# Patient Record
Sex: Female | Born: 1985
Health system: Southern US, Community
[De-identification: ages and names within clinical notes are randomized; demographics above are authoritative.]

## PROBLEM LIST (undated history)

## (undated) DIAGNOSIS — R7303 Prediabetes: Secondary | ICD-10-CM

## (undated) DIAGNOSIS — E119 Type 2 diabetes mellitus without complications: Secondary | ICD-10-CM

## (undated) DIAGNOSIS — I129 Hypertensive chronic kidney disease with stage 1 through stage 4 chronic kidney disease, or unspecified chronic kidney disease: Secondary | ICD-10-CM

## (undated) DIAGNOSIS — I10 Essential (primary) hypertension: Secondary | ICD-10-CM

## (undated) HISTORY — PX: FOOT SURGERY: SHX648

## (undated) HISTORY — DX: Prediabetes: R73.03

## (undated) HISTORY — DX: Hypertensive chronic kidney disease with stage 1 through stage 4 chronic kidney disease, or unspecified chronic kidney disease: I12.9

## (undated) HISTORY — DX: Type 2 diabetes mellitus without complications: E11.9

---

## 2009-11-16 ENCOUNTER — Inpatient Hospital Stay (HOSPITAL_COMMUNITY): Admission: AD | Admit: 2009-11-16 | Discharge: 2009-11-16 | Payer: Self-pay | Admitting: Obstetrics & Gynecology

## 2009-12-06 ENCOUNTER — Other Ambulatory Visit: Payer: Self-pay | Admitting: Obstetrics & Gynecology

## 2009-12-06 ENCOUNTER — Ambulatory Visit: Payer: Self-pay | Admitting: Obstetrics and Gynecology

## 2009-12-06 ENCOUNTER — Encounter: Payer: Self-pay | Admitting: Obstetrics and Gynecology

## 2009-12-07 ENCOUNTER — Encounter: Payer: Self-pay | Admitting: Obstetrics and Gynecology

## 2009-12-07 LAB — CONVERTED CEMR LAB: Trich, Wet Prep: NONE SEEN

## 2009-12-13 ENCOUNTER — Ambulatory Visit: Payer: Self-pay | Admitting: Obstetrics & Gynecology

## 2009-12-13 ENCOUNTER — Encounter: Payer: Self-pay | Admitting: Advanced Practice Midwife

## 2009-12-13 LAB — CONVERTED CEMR LAB: Hgb A2 Quant: 2.8 % (ref 2.2–3.2)

## 2009-12-20 ENCOUNTER — Ambulatory Visit: Payer: Self-pay | Admitting: Obstetrics and Gynecology

## 2009-12-23 ENCOUNTER — Ambulatory Visit: Payer: Self-pay | Admitting: Physician Assistant

## 2009-12-23 ENCOUNTER — Inpatient Hospital Stay (HOSPITAL_COMMUNITY): Admission: AD | Admit: 2009-12-23 | Discharge: 2009-12-24 | Payer: Self-pay | Admitting: Obstetrics & Gynecology

## 2009-12-27 ENCOUNTER — Ambulatory Visit: Payer: Self-pay | Admitting: Obstetrics and Gynecology

## 2009-12-27 ENCOUNTER — Encounter: Payer: Self-pay | Admitting: Family

## 2009-12-28 ENCOUNTER — Ambulatory Visit: Payer: Self-pay | Admitting: Obstetrics and Gynecology

## 2009-12-28 ENCOUNTER — Inpatient Hospital Stay (HOSPITAL_COMMUNITY): Admission: AD | Admit: 2009-12-28 | Discharge: 2009-12-31 | Payer: Self-pay | Admitting: Obstetrics and Gynecology

## 2010-12-27 LAB — URINE CULTURE: Culture: NO GROWTH

## 2010-12-27 LAB — GC/CHLAMYDIA PROBE AMP, GENITAL
Chlamydia, DNA Probe: NEGATIVE
GC Probe Amp, Genital: NEGATIVE

## 2010-12-27 LAB — CBC
Hemoglobin: 10.1 g/dL — ABNORMAL LOW (ref 12.0–15.0)
MCV: 83.1 fL (ref 78.0–100.0)
Platelets: 242 10*3/uL (ref 150–400)

## 2010-12-27 LAB — DIFFERENTIAL
Basophils Absolute: 0 10*3/uL (ref 0.0–0.1)
Basophils Relative: 0 % (ref 0–1)
Blasts: 0 %
Eosinophils Absolute: 0 10*3/uL (ref 0.0–0.7)
Lymphocytes Relative: 12 % (ref 12–46)
Lymphs Abs: 0.7 10*3/uL (ref 0.7–4.0)
Neutro Abs: 4.5 10*3/uL (ref 1.7–7.7)
Neutrophils Relative %: 78 % — ABNORMAL HIGH (ref 43–77)

## 2010-12-27 LAB — HEPATITIS B SURFACE ANTIGEN: Hepatitis B Surface Ag: NEGATIVE

## 2010-12-27 LAB — RPR: RPR Ser Ql: NONREACTIVE

## 2010-12-27 LAB — HIV ANTIBODY (ROUTINE TESTING W REFLEX): HIV: NONREACTIVE

## 2010-12-31 LAB — POCT URINALYSIS DIP (DEVICE)
Bilirubin Urine: NEGATIVE
Bilirubin Urine: NEGATIVE
Glucose, UA: NEGATIVE mg/dL
Glucose, UA: NEGATIVE mg/dL
Ketones, ur: NEGATIVE mg/dL
Ketones, ur: NEGATIVE mg/dL
Nitrite: NEGATIVE
Nitrite: NEGATIVE
Nitrite: NEGATIVE
Protein, ur: 100 mg/dL — AB
Protein, ur: >=300 mg/dL — AB
Specific Gravity, Urine: 1.015 (ref 1.005–1.030)
Urobilinogen, UA: 0.2 mg/dL (ref 0.0–1.0)
Urobilinogen, UA: 0.2 mg/dL (ref 0.0–1.0)
pH: 6.5 (ref 5.0–8.0)
pH: 6.5 (ref 5.0–8.0)

## 2010-12-31 LAB — CBC
HCT: 36.3 % (ref 36.0–46.0)
Hemoglobin: 12 g/dL (ref 12.0–15.0)
MCHC: 33 g/dL (ref 30.0–36.0)
MCV: 81.7 fL (ref 78.0–100.0)

## 2010-12-31 LAB — RPR: RPR Ser Ql: NONREACTIVE

## 2011-10-14 IMAGING — US US OB COMP +14 WK
1 series · 14 of 28 positions shown · non-contrast
Comparison: none

OBSTETRICAL ULTRASOUND:
 This ultrasound exam was performed in the [HOSPITAL] Ultrasound Department.  The OB US report was generated in the AS system, and faxed to the ordering physician.  This report is also available in [HOSPITAL]?s AccessANYware and in [REDACTED] PACS.

[Series 1: us ob comp +14 wk · 0.27mm/px · 59 acquisitions, 14 frames shown]
[im 3/59]
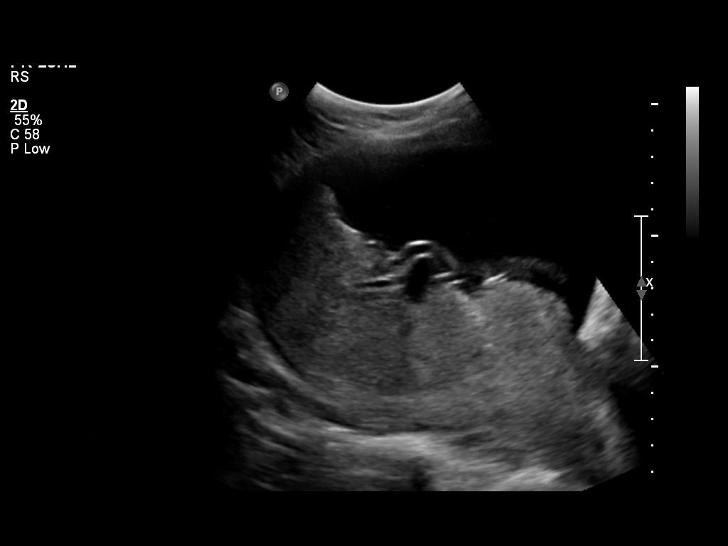
[im 7/59]
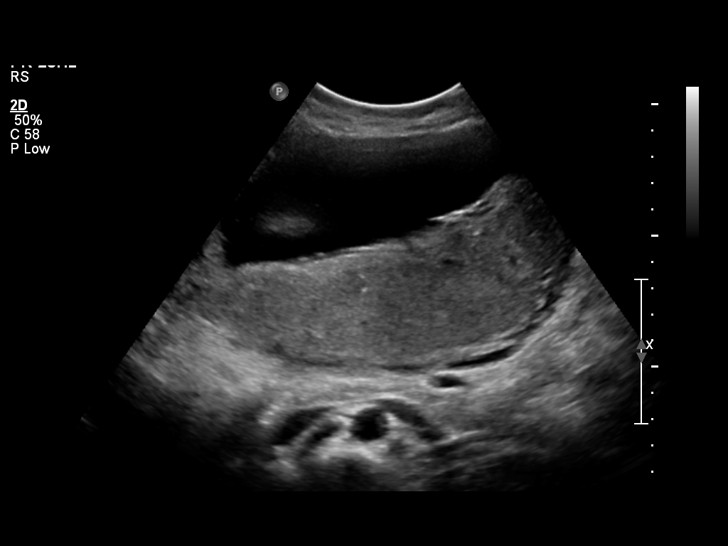
[im 11/59]
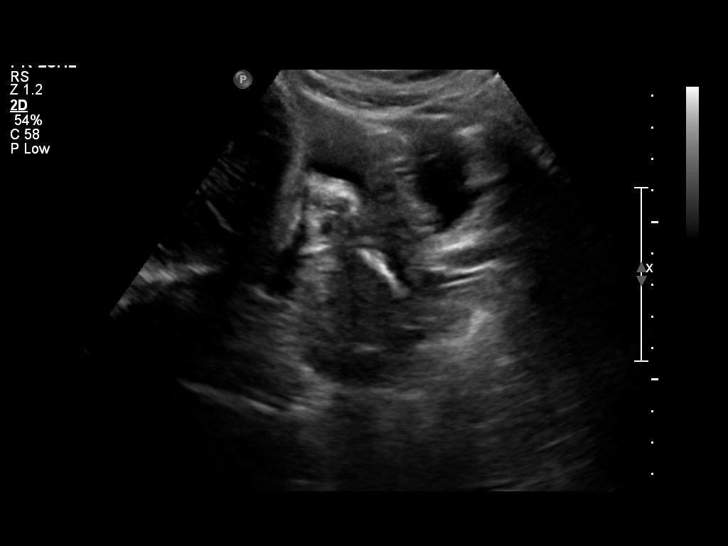
[im 16/59]
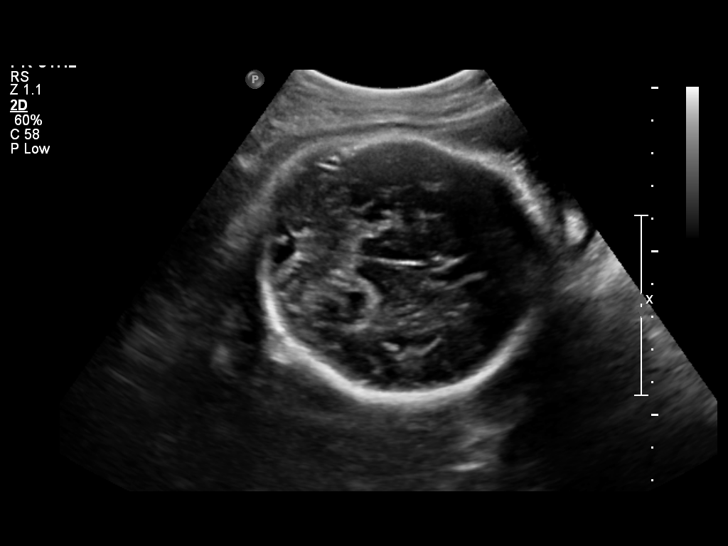
[im 20/59]
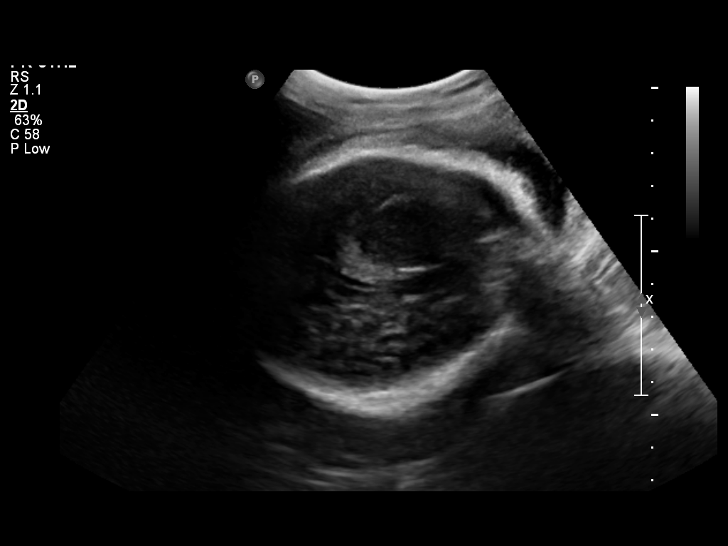
[im 24/59]
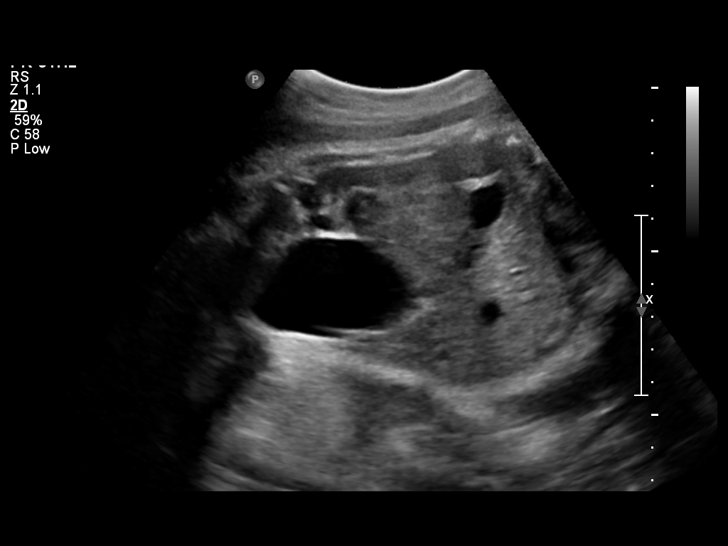
[im 28/59]
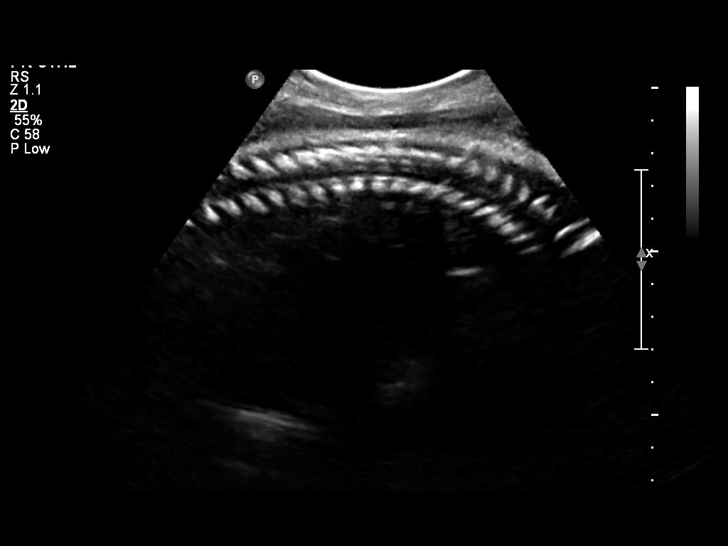
[im 33/59]
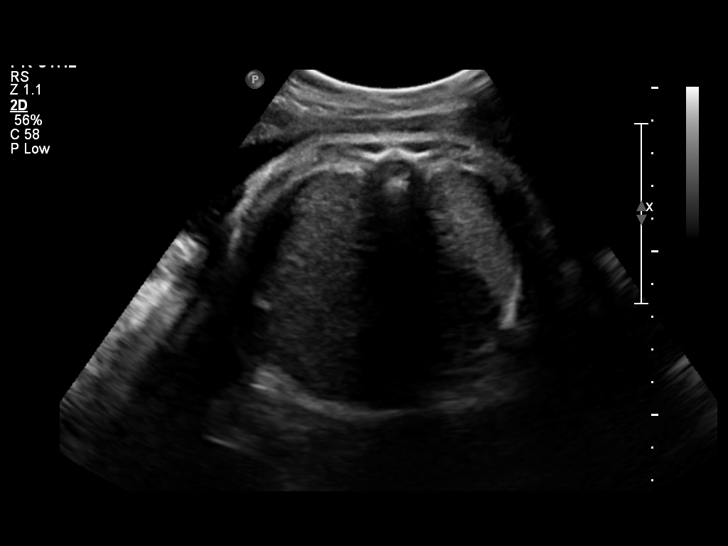
[im 37/59]
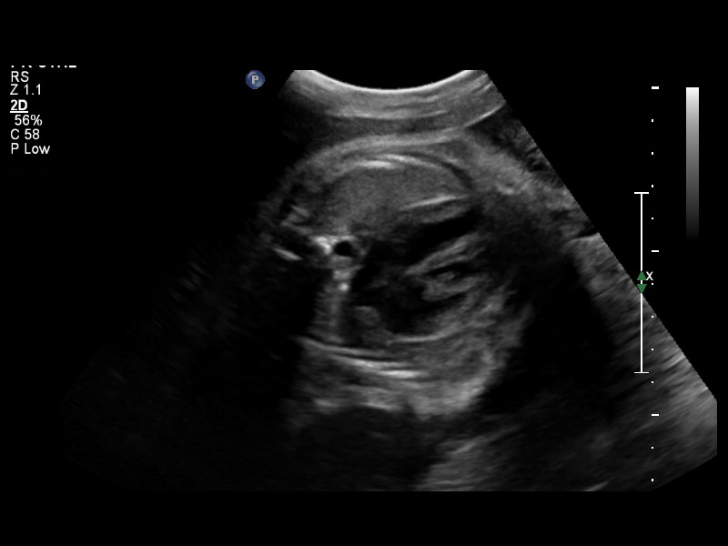
[im 41/59]
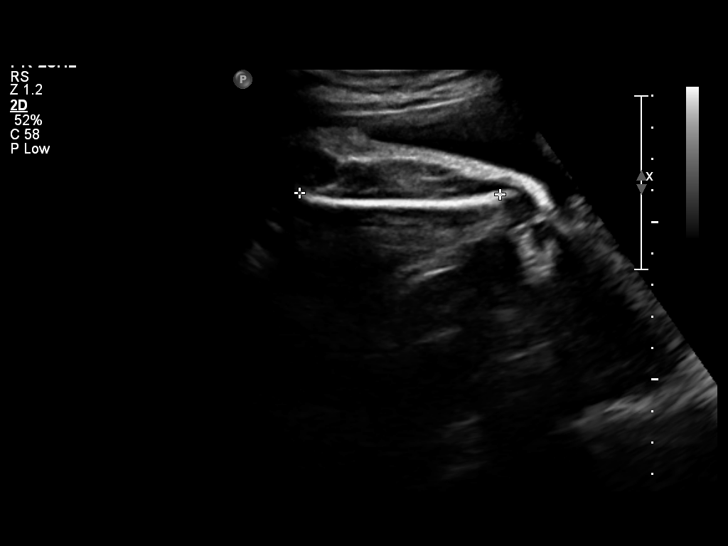
[im 46/59]
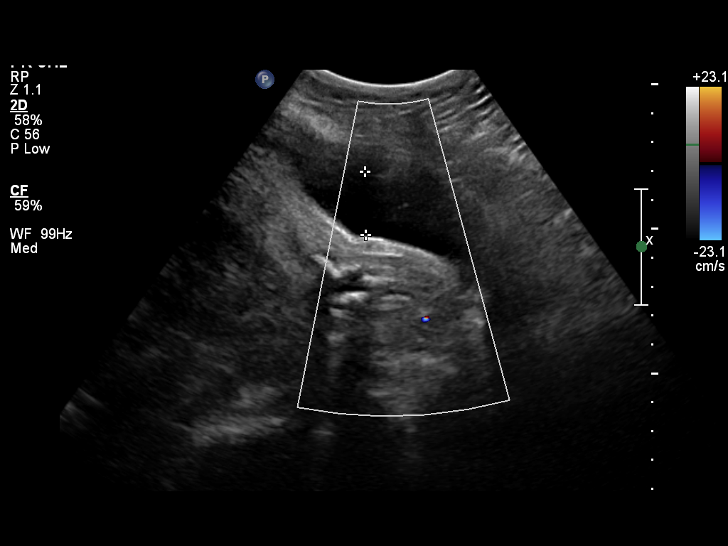
[im 50/59]
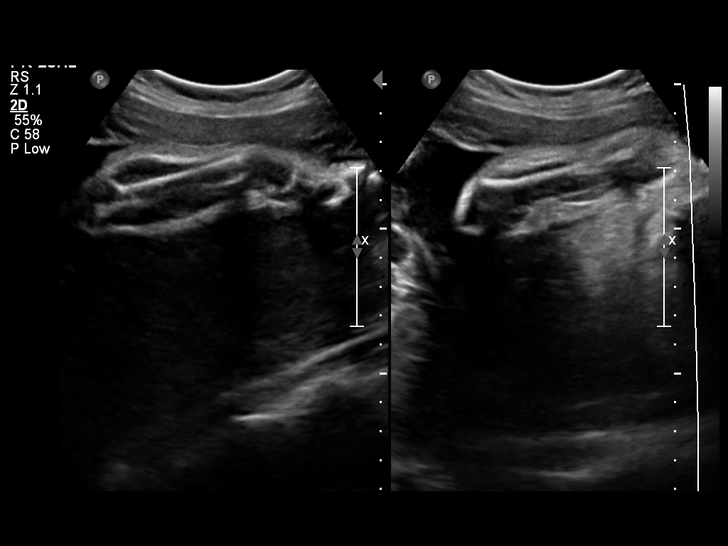
[im 54/59]
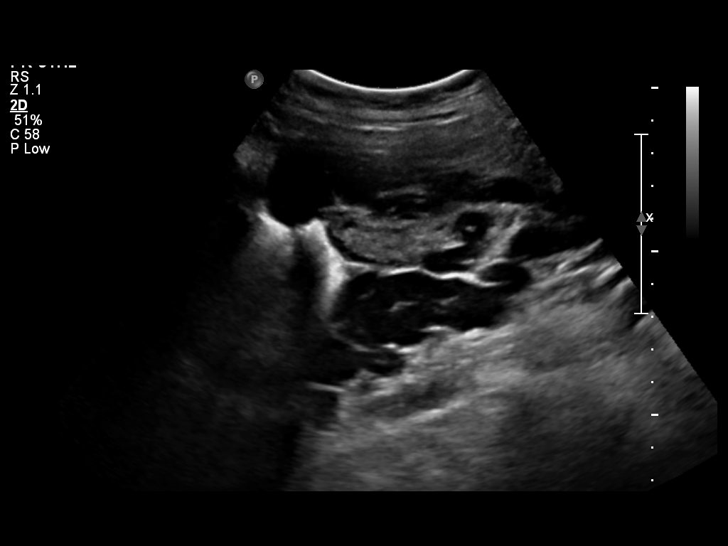
[im 59/59]
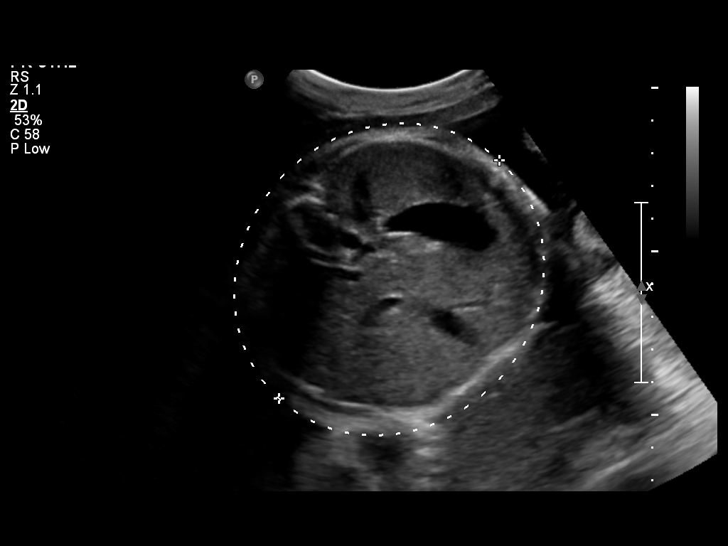

[14 of 28 positions shown; findings below may reference images not displayed]

IMPRESSION: See AS Obstetric US report.

## 2018-07-18 ENCOUNTER — Inpatient Hospital Stay (HOSPITAL_COMMUNITY)
Admission: EM | Admit: 2018-07-18 | Discharge: 2018-07-18 | Disposition: A | Discharge status: Other Acute Inpatient Hospital | Payer: Self-pay | Attending: Family Medicine | Admitting: Family Medicine

## 2018-07-18 ENCOUNTER — Emergency Department (HOSPITAL_COMMUNITY): Payer: Self-pay

## 2018-07-18 ENCOUNTER — Encounter (HOSPITAL_COMMUNITY): Payer: Self-pay | Admitting: Emergency Medicine

## 2018-07-18 DIAGNOSIS — O281 Abnormal biochemical finding on antenatal screening of mother: Secondary | ICD-10-CM | POA: Insufficient documentation

## 2018-07-18 DIAGNOSIS — O10911 Unspecified pre-existing hypertension complicating pregnancy, first trimester: Secondary | ICD-10-CM

## 2018-07-18 DIAGNOSIS — O99810 Abnormal glucose complicating pregnancy: Secondary | ICD-10-CM | POA: Insufficient documentation

## 2018-07-18 DIAGNOSIS — R739 Hyperglycemia, unspecified: Secondary | ICD-10-CM | POA: Insufficient documentation

## 2018-07-18 DIAGNOSIS — O10011 Pre-existing essential hypertension complicating pregnancy, first trimester: Secondary | ICD-10-CM | POA: Insufficient documentation

## 2018-07-18 DIAGNOSIS — R7989 Other specified abnormal findings of blood chemistry: Secondary | ICD-10-CM | POA: Insufficient documentation

## 2018-07-18 DIAGNOSIS — O200 Threatened abortion: Secondary | ICD-10-CM | POA: Insufficient documentation

## 2018-07-18 DIAGNOSIS — Z3A01 Less than 8 weeks gestation of pregnancy: Secondary | ICD-10-CM | POA: Insufficient documentation

## 2018-07-18 HISTORY — DX: Essential (primary) hypertension: I10

## 2018-07-18 LAB — CBC
HCT: 34.2 % — ABNORMAL LOW (ref 36.0–46.0)
HEMATOCRIT: 29.9 % — AB (ref 36.0–46.0)
HEMOGLOBIN: 10.4 g/dL — AB (ref 12.0–15.0)
HEMOGLOBIN: 10 g/dL — AB (ref 12.0–15.0)
MCH: 26.1 pg (ref 26.0–34.0)
MCH: 27.2 pg (ref 26.0–34.0)
MCHC: 30.4 g/dL (ref 30.0–36.0)
MCHC: 33.4 g/dL (ref 30.0–36.0)
MCV: 85.9 fL (ref 80.0–100.0)
MCV: 81.3 fL (ref 80.0–100.0)
PLATELETS: 311 10*3/uL (ref 150–400)
Platelets: 301 10*3/uL (ref 150–400)
RBC: 3.98 MIL/uL (ref 3.87–5.11)
RBC: 3.68 MIL/uL — ABNORMAL LOW (ref 3.87–5.11)
RDW: 13.4 % (ref 11.5–15.5)
RDW: 13.9 % (ref 11.5–15.5)
WBC: 13.5 10*3/uL — AB (ref 4.0–10.5)
WBC: 15.7 10*3/uL — AB (ref 4.0–10.5)
nRBC: 0 % (ref 0.0–0.2)
nRBC: 0 % (ref 0.0–0.2)

## 2018-07-18 LAB — COMPREHENSIVE METABOLIC PANEL
ALK PHOS: 106 U/L (ref 38–126)
ALT: 32 U/L (ref 0–44)
AST: 32 U/L (ref 15–41)
Albumin: 3 g/dL — ABNORMAL LOW (ref 3.5–5.0)
Anion gap: 8 (ref 5–15)
BILIRUBIN TOTAL: 0.5 mg/dL (ref 0.3–1.2)
BUN: 27 mg/dL — AB (ref 6–20)
CALCIUM: 8.9 mg/dL (ref 8.9–10.3)
CO2: 21 mmol/L — ABNORMAL LOW (ref 22–32)
CREATININE: 1.93 mg/dL — AB (ref 0.44–1.00)
Chloride: 105 mmol/L (ref 98–111)
GFR calc Af Amer: 39 mL/min — ABNORMAL LOW (ref >60)
GFR, EST NON AFRICAN AMERICAN: 34 mL/min — AB (ref >60)
GLUCOSE: 173 mg/dL — AB (ref 70–99)
POTASSIUM: 3.7 mmol/L (ref 3.5–5.1)
Sodium: 134 mmol/L — ABNORMAL LOW (ref 135–145)
TOTAL PROTEIN: 6.1 g/dL — AB (ref 6.5–8.1)

## 2018-07-18 LAB — ABO/RH: ABO/RH(D): O POS

## 2018-07-18 LAB — TYPE AND SCREEN
ABO/RH(D): O POS
ABO/RH(D): O POS
ANTIBODY SCREEN: NEGATIVE
ANTIBODY SCREEN: NEGATIVE

## 2018-07-18 LAB — I-STAT BETA HCG BLOOD, ED (MC, WL, AP ONLY): I-stat hCG, quantitative: 559 m[IU]/mL — ABNORMAL HIGH (ref <5)

## 2018-07-18 LAB — HCG, QUANTITATIVE, PREGNANCY: hCG, Beta Chain, Quant, S: 468 m[IU]/mL — ABNORMAL HIGH (ref <5)

## 2018-07-18 MED ORDER — HYDROCODONE-ACETAMINOPHEN 5-325 MG PO TABS
1.0000 | ORAL_TABLET | Freq: Once | ORAL | Status: AC
  Administered 2018-07-18: 1 via ORAL
  Filled 2018-07-18: qty 1

## 2018-07-18 MED ORDER — LABETALOL HCL 200 MG PO TABS: 200.0000 mg | ORAL_TABLET | Freq: Three times a day (TID) | ORAL | 1 refills | Status: DC

## 2018-07-18 MED ORDER — TRAMADOL HCL 50 MG PO TABS: 50.0000 mg | ORAL_TABLET | Freq: Four times a day (QID) | ORAL | 0 refills | Status: DC | PRN

## 2018-07-18 MED ORDER — TRAMADOL HCL 50 MG PO TABS
100.0000 mg | ORAL_TABLET | Freq: Once | ORAL | Status: AC
  Administered 2018-07-18: 100 mg via ORAL
  Filled 2018-07-18: qty 2

## 2018-07-18 MED ORDER — LABETALOL HCL 100 MG PO TABS
200.0000 mg | ORAL_TABLET | Freq: Two times a day (BID) | ORAL | Status: DC
  Administered 2018-07-18: 200 mg via ORAL
  Filled 2018-07-18: qty 2

## 2018-07-18 MED ORDER — SODIUM CHLORIDE 0.9 % IV BOLUS
1000.0000 mL | Freq: Once | INTRAVENOUS | Status: AC
  Administered 2018-07-18: 1000 mL via INTRAVENOUS

## 2018-07-18 NOTE — MAU Provider Note (Signed)
Chief Complaint: Vaginal Bleeding   First Provider Initiated Contact with Patient 07/18/18 1737     SUBJECTIVE HPI: Renee Porter is a 32 y.o. G3P2000 at 107w2d who transferred to Maternity Admissions from [facility] ED for further evaluation for heavy bleeding and pain in early pregnancy. Showed nothing in the uterus or adnexa. iStat quant was 559. Hgb 10.4. Pt had heavy bleeding in the ED.   Vaginal Bleeding: Heavy Passage of tissue or clots: Passing clots. No reported passage of tissue by pt or ED provider Dizziness: Denies  O POS  Pain Location: Suprapubic Quality: cramping Severity: moderate Duration: few days Course: Improved w/ 1 Vicodin in ED, worsening again.  Context: Early pregnancy Timing: intermittent Modifying factors: See above Associated signs and symptoms: Negative for fever, chills, dizziness, urinary complaints, GI complaints.  Positive for heavy vaginal bleeding and passing clots.  Lives in video interpreter is used.  Past Medical History:  Diagnosis Date  . Hypertension    OB History  Gravida Para Term Preterm AB Living  3 2 2         SAB TAB Ectopic Multiple Live Births               # Outcome Date GA Lbr Len/2nd Weight Sex Delivery Anes PTL Lv  3 Current           2 Term      Vag-Spont     1 Term      CS-LTranv      History reviewed. No pertinent surgical history. Social History   Socioeconomic History  . Marital status: Single    Spouse name: Not on file  . Number of children: Not on file  . Years of education: Not on file  . Highest education level: Not on file  Occupational History  . Not on file  Social Needs  . Financial resource strain: Not on file  . Food insecurity:    Worry: Not on file    Inability: Not on file  . Transportation needs:    Medical: Not on file    Non-medical: Not on file  Tobacco Use  . Smoking status: Not on file  Substance and Sexual Activity  . Alcohol use: Not on file  . Drug use: Not on file  .  Sexual activity: Not on file  Lifestyle  . Physical activity:    Days per week: Not on file    Minutes per session: Not on file  . Stress: Not on file  Relationships  . Social connections:    Talks on phone: Not on file    Gets together: Not on file    Attends religious service: Not on file    Active member of club or organization: Not on file    Attends meetings of clubs or organizations: Not on file    Relationship status: Not on file  . Intimate partner violence:    Fear of current or ex partner: Not on file    Emotionally abused: Not on file    Physically abused: Not on file    Forced sexual activity: Not on file  Other Topics Concern  . Not on file  Social History Narrative  . Not on file   No current facility-administered medications on file prior to encounter.    Current Outpatient Medications on File Prior to Encounter  Medication Sig Dispense Refill  . acetaminophen (TYLENOL) 500 MG tablet Take 500 mg by mouth every 6 (six) hours as needed for mild  pain.    . ibuprofen (ADVIL,MOTRIN) 200 MG tablet Take 200 mg by mouth every 6 (six) hours as needed for headache.     No Known Allergies  I have reviewed the past Medical Hx, Surgical Hx, Social Hx, Allergies and Medications.   Review of Systems  Constitutional: Negative for chills and fever.  Respiratory: Negative for shortness of breath.   Cardiovascular: Negative for chest pain.  Gastrointestinal: Positive for abdominal pain. Negative for nausea and vomiting.  Genitourinary: Positive for vaginal bleeding. Negative for dysuria.  Neurological: Negative for dizziness.    OBJECTIVE Patient Vitals for the past 24 hrs:  BP Temp Temp src Pulse Resp SpO2  07/18/18 2100 (!) 185/98 - - 81 - -  07/18/18 2045 (!) 182/96 - - 79 - -  07/18/18 2033 (!) 182/106 - - 81 - -  07/18/18 2024 (!) 191/101 - - 78 - -  07/18/18 1734 (!) 175/105 98 F (36.7 C) Oral 88 16 99 %  07/18/18 1700 (!) 171/93 - - 86 11 100 %  07/18/18 1600  (!) 147/95 - - 89 11 99 %  07/18/18 1530 (!) 142/89 - - 84 18 100 %  07/18/18 1430 (!) 183/108 - - (!) 106 (!) 9 100 %  07/18/18 1315 (!) 168/98 - - (!) 107 10 99 %  07/18/18 1300 (!) 181/104 - - (!) 112 11 100 %  07/18/18 1230 (!) 132/119 - - - - -  07/18/18 1218 (!) 179/120 98.1 F (36.7 C) - (!) 112 19 100 %   Constitutional: Well-developed, well-nourished female in no acute distress.  Cardiovascular: normal rate Respiratory: normal rate and effort.  GI: Abd soft, non-tender.  Neurologic: Alert and oriented x 4.  GU:   PELVIC EXAM: NEFG, small amount of bright red blood on pad  BIMANUAL: Deferred due to recent exam  LAB RESULTS Results for orders placed or performed during the hospital encounter of 07/18/18 (from the past 24 hour(s))  Type and screen [place]     Status: None   Collection Time: 07/18/18 12:20 PM  Result Value Ref Range   ABO/RH(D) O POS    Antibody Screen NEG    Sample Expiration      07/21/2018 Performed at [facility], [place] [address] [place], [place] [address]   ABO/Rh     Status: None   Collection Time: 07/18/18 12:20 PM  Result Value Ref Range   ABO/RH(D) O POS    No rh immune globuloin      NOT A RH IMMUNE GLOBULIN CANDIDATE, PT RH POSITIVE Performed at [facility] [address] [place], [place] [address]   Comprehensive metabolic panel     Status: Abnormal   Collection Time: 07/18/18 12:25 PM  Result Value Ref Range   Sodium 134 (L) 135 - 145 mmol/L   Potassium 3.7 3.5 - 5.1 mmol/L   Chloride 105 98 - 111 mmol/L   CO2 21 (L) 22 - 32 mmol/L   Glucose, Bld 173 (H) 70 - 99 mg/dL   BUN 27 (H) 6 - 20 mg/dL   Creatinine, Ser 1.93 (H) 0.44 - 1.00 mg/dL   Calcium 8.9 8.9 - 10.3 mg/dL   Total Protein 6.1 (L) 6.5 - 8.1 g/dL   Albumin 3.0 (L) 3.5 - 5.0 g/dL   AST 32 15 - 41 U/L   ALT 32 0 - 44 U/L   Alkaline Phosphatase 106 38 - 126 U/L   Total  Bilirubin 0.5 0.3 - 1.2 mg/dL   GFR calc non Af Amer 34 (L) >60  mL/min   GFR calc Af Amer 39 (L) >60 mL/min   Anion gap 8 5 - 15  CBC     Status: Abnormal   Collection Time: 07/18/18 12:25 PM  Result Value Ref Range   WBC 13.5 (H) 4.0 - 10.5 K/uL   RBC 3.98 3.87 - 5.11 MIL/uL   Hemoglobin 10.4 (L) 12.0 - 15.0 g/dL   HCT 34.2 (L) 36.0 - 46.0 %   MCV 85.9 80.0 - 100.0 fL   MCH 26.1 26.0 - 34.0 pg   MCHC 30.4 30.0 - 36.0 g/dL   RDW 13.4 11.5 - 15.5 %   Platelets 311 150 - 400 K/uL   nRBC 0.0 0.0 - 0.2 %  I-Stat beta hCG blood, ED     Status: Abnormal   Collection Time: 07/18/18 12:30 PM  Result Value Ref Range   I-stat hCG, quantitative 559.0 (H) <5 mIU/mL   Comment 3          CBC     Status: Abnormal   Collection Time: 07/18/18  6:32 PM  Result Value Ref Range   WBC 15.7 (H) 4.0 - 10.5 K/uL   RBC 3.68 (L) 3.87 - 5.11 MIL/uL   Hemoglobin 10.0 (L) 12.0 - 15.0 g/dL   HCT 29.9 (L) 36.0 - 46.0 %   MCV 81.3 80.0 - 100.0 fL   MCH 27.2 26.0 - 34.0 pg   MCHC 33.4 30.0 - 36.0 g/dL   RDW 13.9 11.5 - 15.5 %   Platelets 301 150 - 400 K/uL   nRBC 0.0 0.0 - 0.2 %  Type and screen     Status: None   Collection Time: 07/18/18  6:32 PM  Result Value Ref Range   ABO/RH(D) O POS    Antibody Screen NEG    Sample Expiration      07/21/2018 Performed at [facility], [address], [place], [place] [address]   hCG, quantitative, pregnancy     Status: Abnormal   Collection Time: 07/18/18  6:32 PM  Result Value Ref Range   hCG, Beta Chain, Quant, S 468 (H) <5 mIU/mL    IMAGING US Ob Comp < 14 Wks  Result Date: 07/18/2018 CLINICAL DATA:  Pregnant patient with vaginal bleeding. EXAM: OBSTETRIC <14 WK [place] AND TRANSVAGINAL OB US TECHNIQUE: Both transabdominal and transvaginal ultrasound examinations were performed for complete evaluation of the gestation as well as the maternal uterus, adnexal regions, and pelvic cul-de-sac. Transvaginal technique was performed to assess early pregnancy. COMPARISON:  None. FINDINGS: Intrauterine gestational sac: None  Yolk sac:  Not Visualized. Embryo:  Not Visualized. Cardiac Activity: Not Visualized. Maternal uterus/adnexae: Uterus is heterogeneous in echogenicity. Ovaries are not visualized due to overlying bowel gas. Adnexal structures are not well assessed. No free fluid in the pelvis. IMPRESSION: No intrauterine gestation identified. In the setting of positive pregnancy test and no definite intrauterine pregnancy, this reflects a pregnancy of unknown location. Differential considerations include early normal IUP, abnormal IUP, or nonvisualized ectopic pregnancy. Differentiation is achieved with serial beta HCG supplemented by repeat sonography as clinically warranted. Electronically Signed   By: Lovey Newcomer M.D.   On: 07/18/2018 15:59   US Ob Transvaginal  Result Date: 07/18/2018 CLINICAL DATA:  Pregnant patient with vaginal bleeding. EXAM: OBSTETRIC <14 WK [place] AND TRANSVAGINAL OB US TECHNIQUE: Both transabdominal and transvaginal ultrasound examinations were performed for complete evaluation of the gestation  as well as the maternal uterus, adnexal regions, and pelvic cul-de-sac. Transvaginal technique was performed to assess early pregnancy. COMPARISON:  None. FINDINGS: Intrauterine gestational sac: None Yolk sac:  Not Visualized. Embryo:  Not Visualized. Cardiac Activity: Not Visualized. Maternal uterus/adnexae: Uterus is heterogeneous in echogenicity. Ovaries are not visualized due to overlying bowel gas. Adnexal structures are not well assessed. No free fluid in the pelvis. IMPRESSION: No intrauterine gestation identified. In the setting of positive pregnancy test and no definite intrauterine pregnancy, this reflects a pregnancy of unknown location. Differential considerations include early normal IUP, abnormal IUP, or nonvisualized ectopic pregnancy. Differentiation is achieved with serial beta HCG supplemented by repeat sonography as clinically warranted. Electronically Signed   By: Lovey Newcomer M.D.   On:  07/18/2018 15:59    MAU COURSE CBC, Quant  MDM - Plain and bleeding in early pregnancy with pregnancy of unknown anatomic location and hemodynamically unstable.  -Chronic hypertension, not on any meds.  Has previously been on lisinopril.  No chest pain or shortness of breath.  Had been addressed by ED.  Start labetalol 200 mg p.o. 3 times daily.  First dose given in MAU.  Blood pressure check on 07/20/2018.  -Elevated serum creatinine.  No baseline.  Recheck at follow-up appointment.  ASSESSMENT 1. Threatened abortion in early pregnancy   2. Less than [redacted] weeks gestation of pregnancy   3. Maternal chronic hypertension in first trimester   4. Elevated serum creatinine   5. Hyperglycemia during pregnancy     PLAN Discharge home in stable condition. SAB/ectopic precautions Pretension precautions Follow-up [place] for [facility] Follow up on 07/20/2018.   Specialty:  Obstetrics and Gynecology Why:  at 2:00 pm for repeat bloodwork and blood pressure check  Contact information: [place] Follow up.   Why:  as needed in pregnancy emergencies Contact information: [address] [id number] [place] [phone]         Allergies as of 07/18/2018   No Known Allergies     Medication List    STOP taking these medications   ibuprofen 200 MG tablet Commonly known as:  ADVIL,MOTRIN     TAKE these medications   acetaminophen 500 MG tablet Commonly known as:  TYLENOL Take 500 mg by mouth every 6 (six) hours as needed for mild pain.   labetalol 200 MG tablet Commonly known as:  NORMODYNE Take 1 tablet (200 mg total) by mouth 3 (three) times daily.   traMADol 50 MG tablet Commonly known as:  ULTRAM Take 1-2 tablets (50-100 mg total) by mouth every 6 (six) hours as needed for severe pain.        Tamala Julian,  [place], [place] 07/18/2018  9:10 PM  4

## 2018-07-18 NOTE — ED Provider Notes (Addendum)
[facility] EMERGENCY DEPARTMENT Provider Note   CSN: [id number] Arrival date & time: 07/18/18  1209     History   Chief Complaint Chief Complaint  Patient presents with  . Vaginal Bleeding    HPI Renee Porter is a 32 y.o. female.  HPI   She presents for evaluation of intermittent vaginal bleeding for 2 weeks.  The bleeding recurred today, and is associated with low abdominal and pelvic discomfort.  Pain is worse when she ambulates.  She passed multiple blood clots today.  Last menstrual period was about 2 months ago.  She has been trying to get pregnant.  She has had 2 prior pregnancies, with one vaginal delivery, and one cesarean delivery.  She denies headache, nausea, vomiting, fever, chills, upper abdominal pain or back pain.  Try to work today but did leave when she developed pain and increased bleeding.  There are no other known modifying factors.   Past Medical History:  Diagnosis Date  . Hypertension     There are no active problems to display for this patient.   History reviewed. No pertinent surgical history.   OB History    Gravida  3   Para  2   Term  2   Preterm      AB      Living        SAB      TAB      Ectopic      Multiple      Live Births               Home Medications    Prior to Admission medications   Medication Sig Start Date End Date Taking? Authorizing Provider  acetaminophen (TYLENOL) 500 MG tablet Take 500 mg by mouth every 6 (six) hours as needed for mild pain.   Yes [provider]  labetalol (NORMODYNE) 200 MG tablet Take 1 tablet (200 mg total) by mouth 3 (three) times daily. 07/18/18   Tamala Julian, [place], CNM  traMADol (ULTRAM) 50 MG tablet Take 1-2 tablets (50-100 mg total) by mouth every 6 (six) hours as needed for severe pain. 07/18/18   Manya Silvas, CNM    Family History No family history on file.  Social History Social History   Tobacco Use  . Smoking status: Not on  file  Substance Use Topics  . Alcohol use: Not on file  . Drug use: Not on file     Allergies   Patient has no known allergies.   Review of Systems Review of Systems  All other systems reviewed and are negative.    Physical Exam Updated Vital Signs BP (!) 185/98   Pulse 81   Temp 98 F (36.7 C) (Oral)   Resp 16   LMP 05/21/2018 (Within Weeks)   SpO2 99% Comment: ra  Physical Exam  Constitutional: She is oriented to person, place, and time. She appears well-developed and well-nourished.  HENT:  Head: Normocephalic and atraumatic.  Eyes: Pupils are equal, round, and reactive to light. Conjunctivae and EOM are normal.  Neck: Normal range of motion and phonation normal. Neck supple.  Cardiovascular: Normal rate and regular rhythm.  Pulmonary/Chest: Effort normal and breath sounds normal. She exhibits no tenderness.  Abdominal: Soft. She exhibits no distension. There is no tenderness. There is no guarding.  Genitourinary:  Genitourinary Comments: Normal external female genitalia.  Vagina with large amount of blood and multiple clots.  Unable to clear clots from vagina using 8  large cotton swabs.  Cervix could not be visualized but with gentle sounding using a swab, seems to be open.  Musculoskeletal: Normal range of motion.  Neurological: She is alert and oriented to person, place, and time. She exhibits normal muscle tone.  Skin: Skin is warm and dry.  Psychiatric: She has a normal mood and affect. Her behavior is normal. Judgment and thought content normal.  Nursing note and vitals reviewed.    ED Treatments / Results  Labs (all labs ordered are listed, but only abnormal results are displayed) Labs Reviewed  COMPREHENSIVE METABOLIC PANEL - Abnormal; Notable for the following components:      Result Value   Sodium 134 (*)    CO2 21 (*)    Glucose, Bld 173 (*)    BUN 27 (*)    Creatinine, Ser 1.93 (*)    Total Protein 6.1 (*)    Albumin 3.0 (*)    GFR calc non Af  Amer 34 (*)    GFR calc Af Amer 39 (*)    All other components within normal limits  CBC - Abnormal; Notable for the following components:   WBC 13.5 (*)    Hemoglobin 10.4 (*)    HCT 34.2 (*)    All other components within normal limits  CBC - Abnormal; Notable for the following components:   WBC 15.7 (*)    RBC 3.68 (*)    Hemoglobin 10.0 (*)    HCT 29.9 (*)    All other components within normal limits  HCG, QUANTITATIVE, PREGNANCY - Abnormal; Notable for the following components:   hCG, Beta Chain, Quant, S 468 (*)    All other components within normal limits  I-STAT BETA HCG BLOOD, ED ([facility], [facility], [facility] ONLY) - Abnormal; Notable for the following components:   I-stat hCG, quantitative 559.0 (*)    All other components within normal limits  TYPE AND SCREEN  ABO/RH  TYPE AND SCREEN    EKG None  Radiology US Ob Comp < 14 Wks  Result Date: 07/18/2018 CLINICAL DATA:  Pregnant patient with vaginal bleeding. EXAM: OBSTETRIC <14 WK [place] AND TRANSVAGINAL OB US TECHNIQUE: Both transabdominal and transvaginal ultrasound examinations were performed for complete evaluation of the gestation as well as the maternal uterus, adnexal regions, and pelvic cul-de-sac. Transvaginal technique was performed to assess early pregnancy. COMPARISON:  None. FINDINGS: Intrauterine gestational sac: None Yolk sac:  Not Visualized. Embryo:  Not Visualized. Cardiac Activity: Not Visualized. Maternal uterus/adnexae: Uterus is heterogeneous in echogenicity. Ovaries are not visualized due to overlying bowel gas. Adnexal structures are not well assessed. No free fluid in the pelvis. IMPRESSION: No intrauterine gestation identified. In the setting of positive pregnancy test and no definite intrauterine pregnancy, this reflects a pregnancy of unknown location. Differential considerations include early normal IUP, abnormal IUP, or nonvisualized ectopic pregnancy. Differentiation is achieved with serial beta HCG supplemented by  repeat sonography as clinically warranted. Electronically Signed   By: Lovey Newcomer M.D.   On: 07/18/2018 15:59   US Ob Transvaginal  Result Date: 07/18/2018 CLINICAL DATA:  Pregnant patient with vaginal bleeding. EXAM: OBSTETRIC <14 WK [place] AND TRANSVAGINAL OB US TECHNIQUE: Both transabdominal and transvaginal ultrasound examinations were performed for complete evaluation of the gestation as well as the maternal uterus, adnexal regions, and pelvic cul-de-sac. Transvaginal technique was performed to assess early pregnancy. COMPARISON:  None. FINDINGS: Intrauterine gestational sac: None Yolk sac:  Not Visualized. Embryo:  Not Visualized. Cardiac Activity: Not Visualized. Maternal uterus/adnexae:  Uterus is heterogeneous in echogenicity. Ovaries are not visualized due to overlying bowel gas. Adnexal structures are not well assessed. No free fluid in the pelvis. IMPRESSION: No intrauterine gestation identified. In the setting of positive pregnancy test and no definite intrauterine pregnancy, this reflects a pregnancy of unknown location. Differential considerations include early normal IUP, abnormal IUP, or nonvisualized ectopic pregnancy. Differentiation is achieved with serial beta HCG supplemented by repeat sonography as clinically warranted. Electronically Signed   By: Lovey Newcomer M.D.   On: 07/18/2018 15:59    Procedures Procedures (including critical care time)  Medications Ordered in ED Medications  HYDROcodone-acetaminophen (NORCO/VICODIN) 5-325 MG per tablet 1 tablet (1 tablet Oral Given 07/18/18 1414)  sodium chloride 0.9 % bolus 1,000 mL (0 mLs Intravenous Stopped 07/18/18 2111)  traMADol (ULTRAM) tablet 100 mg (100 mg Oral Given 07/18/18 2030)     Initial Impression / Assessment and Plan / ED Course  I have reviewed the triage vital signs and the nursing notes.  Pertinent labs & imaging results that were available during my care of the patient were reviewed by me and considered in my  medical decision making (see chart for details).  Clinical Course as of Jul 19 2148  Sat Jul 18, 2018  1536 High  I-stat hCG, quantitative(!): 559.0 [EW]  1536 Normal except white count high, hemoglobin low  CBC(!) [EW]  1536 Normal except sodium low, CO2 low, glucose high, BUN high, creatinine high, total protein low, albumin low, GFR low  Comprehensive metabolic panel(!) [EW]  0272 Blood type O+, no need for RhoGam.  Type and screen [place] [EW]  1610 I reviewed with the husband and he is translating to his wife the results of the ultrasound and her testing.  I explained that this could be a normal pregnancy and just very early in her dates may be off.  Also she might of just had a miscarriage and will need another blood test to identify this.  They also understand the third possibility that this could be an ectopic pregnancy and she will need to follow-up with a specialist.  They understand to go to [facility] if any increased pain or bleeding and if everything is okay they will need to repeat blood test in 2 to 3 days.   [MB]  1640 Husband asks if I could go reexamine her because she had some more bleeding prior to discharge.  Her vault is full of clot and difficult to remove enough to see where the cervix is.   [MB]  5366 Discussed with Dr[facility] Kennon [facility] who recommends that the patient be transferred over to the MA you for evaluation by them.  I have updated the patient and her husband.   [MB]    Clinical Course User Index [EW] Daleen Bo, MD [MB] Hayden Rasmussen, MD     No data found.    Medical Decision Making: Bleeding in early pregnancy, clinically appears to be miscarriage.  No indication for RhoGam.  Mild blood pressure elevation without significant tachycardia or appearance hemodynamic instability.  Ultrasound ordered to evaluate for ectopic pregnancy/pregnancy complications.  CRITICAL CARE-no Performed by: Daleen Bo   Nursing  Notes Reviewed/ Care Coordinated Applicable Imaging Reviewed Interpretation of Laboratory Data incorporated into ED treatment   Plan-as per oncoming provider team to evaluate after imaging.   Final Clinical Impressions(s) / ED Diagnoses   Final diagnoses:  Less than [redacted] weeks gestation of pregnancy  Threatened abortion in early pregnancy  Maternal chronic hypertension in first trimester  Elevated serum creatinine  Hyperglycemia during pregnancy    ED Discharge Orders         Ordered    labetalol (NORMODYNE) 200 MG tablet  3 times daily     07/18/18 2105    Discharge patient     07/18/18 2105    traMADol (ULTRAM) 50 MG tablet  Every 6 hours PRN     07/18/18 2105           Daleen Bo, MD 07/18/18 1608    Daleen Bo, MD 07/19/18 2150

## 2018-07-18 NOTE — Discharge Instructions (Addendum)
You were evaluated in the emergency department for lower abdominal pain and vaginal bleeding in the setting of an early pregnancy.  You had an ultrasound that did not show a pregnancy in the uterus.  This will need to be followed by [facility] on the ground floor in 2 days.  If you have any increased bleeding or pain please go to Maternity Admissions immediately.    Amenaza de aborto (Threatened Miscarriage) La amenaza de aborto se produce cuando hay hemorragia vaginal durante las primeras 20semanas de [place], pero el embarazo no se interrumpe. Si durante este perodo usted tiene hemorragia vaginal, el mdico le har pruebas para asegurarse de que el embarazo contine. Si las pruebas muestran que usted contina embarazada y que el "beb" en desarrollo (feto) dentro del tero sigue creciendo, se considera que tuvo una [place] de aborto. La amenaza de aborto no implica que el embarazo vaya a Manufacturing engineer, pero s aumenta el riesgo de perder el embarazo (aborto completo). CAUSAS Por lo general, no se conoce la causa de la amenaza de aborto. Si el resultado final es el aborto completo, la causa ms frecuente es la cantidad anormal de cromosomas del feto. Los cromosomas son las estructuras internas de las clulas que contienen todo Agricultural engineer gentico. Clinical research associate de las causas de hemorragia vaginal que no ocasionan un aborto incluyen:  [place].  [place].  Los cambios hormonales normales durante el [place].  La hemorragia que se produce cuando el vulo se implanta en el tero. FACTORES DE RIESGO Los factores de riesgo de hemorragia al principio del embarazo incluyen:  Obesidad.  Fumar.  El consumo de cantidades excesivas de alcohol o cafena.  El consumo de drogas. SIGNOS Y SNTOMAS  Hemorragia vaginal leve.  Dolor o clicos abdominales leves. DIAGNSTICO Si tiene hemorragia con o sin dolor abdominal antes de las 20semanas de  [place], el mdico le har pruebas para determinar si el embarazo contina. Una prueba importante incluye el uso de ondas sonoras y de una computadora (ecografa) para crear imgenes del interior del tero. Otras pruebas incluyen el examen interno de la vagina y el tero (examen plvico), y el control de la frecuencia cardaca del feto. Es posible que le diagnostiquen una amenaza de aborto en los siguientes casos:  La ecografa muestra que el embarazo contina.  La frecuencia cardaca del feto es alta.  El examen plvico muestra que la apertura entre el tero y la vagina (cuello del tero) est cerrada.  Su frecuencia cardaca y su presin arterial estn estables.  Los [employer] de sangre confirman que el embarazo contina. TRATAMIENTO No se ha demostrado que ningn tratamiento evite que una amenaza de aborto se [place] en un aborto completo. Sin embargo, los cuidados [employer] hogar son importantes. INSTRUCCIONES PARA EL CUIDADO EN EL HOGAR  Asegrese de asistir a todas las citas de cuidados prenatales. Esto es [employer].  Descanse lo suficiente.  No tenga relaciones sexuales ni use tampones si tiene hemorragia vaginal.  No se haga duchas vaginales.  No fume ni consuma drogas.  No beba alcohol.  Evite la cafena. SOLICITE ATENCIN MDICA SI:  Tiene una ligera hemorragia o manchado vaginal durante el embarazo.  Tiene dolor o clicos en el abdomen.  Tiene fiebre. SOLICITE ATENCIN MDICA DE INMEDIATO SI:  Tiene una hemorragia vaginal abundante.  Elimina cogulos de sangre por la vagina.  Siente dolor en la parte baja de la espalda o clicos abdominales intensos.  Tiene fiebre, escalofros  y dolor abdominal intenso. ASEGRESE DE QUE:  Comprende estas instrucciones.  Controlar su afeccin.  Recibir ayuda de inmediato si no mejora o si empeora. Esta informacin no tiene Marine scientist el consejo del mdico. Asegrese de hacerle al mdico cualquier  pregunta que tenga. Document Released: 07/03/2005 Document Revised: 09/28/2013 Document Reviewed: 07/20/2013 Elsevier Interactive Patient Education  2018 [employer].   Hipertensin Hypertension La hipertensin, conocida comnmente como presin arterial alta, se produce cuando la sangre bombea en las arterias con mucha fuerza. Las arterias son los vasos sanguneos que transportan la sangre desde el corazn al resto del cuerpo. La hipertensin hace que el corazn haga ms esfuerzo para Chiropodist y [employer] que las arterias se Teacher, music o Advertising account executive. La hipertensin no tratada o no controlada puede causar infarto de miocardio, accidentes cerebrovasculares, enfermedad renal y otros problemas. Una lectura de la presin arterial consiste de un nmero ms alto sobre un nmero ms bajo. En condiciones ideales, la presin arterial debe estar por debajo de 120/80. El primer nmero ("superior") es la presin sistlica. Es la medida de la presin de las arterias cuando el corazn late. El segundo nmero ("inferior") es la presin diastlica. Es la medida de la presin en las arterias cuando el corazn se relaja. Cules son las causas? Se desconoce la causa de esta afeccin. Qu incrementa el riesgo? Algunos factores de riesgo de hipertensin estn bajo su control. Otros no. Factores que puede [employer].  Tener diabetes mellitus tipo 2, colesterol alto, o ambos.  No hacer la cantidad suficiente de actividad fsica o ejercicio.  Tener sobrepeso.  Consumir mucha grasa, azcar, caloras o sal (sodio) en su dieta.  Beber alcohol en exceso. Factores que son difciles o imposibles de modificar  Tener enfermedad renal crnica.  Tener antecedentes familiares de presin arterial alta.  La edad. Los riesgos aumentan con la edad.  La raza. El riesgo es mayor para las Retail banker.  El sexo. Antes de los 45aos, los hombres corren ms [employer]. Despus de  los 65aos, las mujeres corren ms [employer].  Tener apnea obstructiva del sueo.  El estrs. Cules son los signos o los sntomas? La presin arterial extremadamente alta (crisis hipertensiva) puede provocar:  Dolor de [place].  Ansiedad.  Falta de aire.  Hemorragia nasal.  Nuseas y vmitos.  Dolor de pecho intenso.  Una crisis de movimientos que no puede controlar (convulsiones).  Cmo se diagnostica? Esta afeccin se diagnostica midiendo su presin arterial mientras se encuentra sentado, con el brazo apoyado sobre una superficie. El brazalete del tensimetro debe colocarse directamente sobre la piel de la parte superior del brazo y al nivel de su corazn. Debe medirla al [facility] veces en el mismo brazo. Determinadas condiciones pueden causar una diferencia de presin arterial entre el brazo izquierdo y Insurance underwriter. Ciertos factores pueden provocar que las lecturas de la presin arterial sean inferiores o superiores a lo normal (elevadas) por un perodo corto de tiempo:  Si su presin arterial es ms alta cuando se encuentra en el consultorio del mdico que cuando la mide en su hogar, se denomina "hipertensin de bata blanca". La [employer] de las personas que tienen esta afeccin no deben ser [employer].  Si su presin arterial es ms alta en el hogar que cuando se encuentra en el consultorio del mdico, se denomina "hipertensin enmascarada". La [employer] de las personas que tienen esta afeccin deben ser medicadas para controlar la presin arterial.  Si tiene  una lecturas de presin arterial alta durante una visita o si tiene presin arterial normal con otros factores de riesgo:  Es posible que se le pida que regrese otro da para volver a Chief Technology Officer su presin arterial.  Se le puede pedir que se controle la presin arterial en su casa durante 1 semana o ms.  Si se le diagnostica hipertensin, es posible que se le realicen otros anlisis de sangre o estudios de  diagnstico por imgenes para ayudar a su mdico a comprender su riesgo general de tener otras afecciones. Cmo se trata? Esta afeccin se trata haciendo cambios saludables en el estilo de vida, tales como ingerir alimentos saludables, realizar ms ejercicio y reducir el consumo de alcohol. El mdico puede recetarle medicamentos si los cambios en el estilo de vida no son suficientes para Child psychotherapist la presin arterial y si:  Su presin arterial sistlica est por encima de 130.  Su presin arterial diastlica est por encima de 80.  La presin arterial deseada puede variar en funcin de las enfermedades, la edad y otros factores personales. Siga estas instrucciones en su casa: Comida y bebida  Siga una dieta con alto contenido de fibras y [place], y con bajo contenido de sodio, Location manager agregada y Physicist, medical. Un ejemplo de plan alimenticio es la dieta DASH (Dietary Approaches to Stop Hypertension, Mtodos alimenticios para detener la hipertensin). Para alimentarse de esta manera: ? Coma mucha fruta y [place]. Trate de que la mitad del plato de cada comida sea de frutas y verduras. ? Coma cereales integrales, como pasta integral, arroz integral y pan integral. Llene aproximadamente un cuarto del plato con cereales integrales. ? Coma y beba productos lcteos con bajo contenido de grasa, como leche descremada o yogur bajo en grasas. ? Evite la ingesta de cortes de carne grasa, carne procesada o curada, y carne de ave con piel. Llene aproximadamente un cuarto del plato con protenas magras, como pescado, pollo sin piel, frijoles, huevos y tofu. ? Evite ingerir alimentos prehechos o procesados. En general, estos tienen mayor cantidad de sodio, azcar agregada y Wendee Copp.  Reduzca su ingesta diaria de sodio. La mayora de las personas que tienen hipertensin deben comer menos de 1500 mg de sodio por [employer].  Limite el consumo de alcohol a no ms de 1 medida por da si es mujer y no est Music therapist  y a 2 medidas por da si es hombre. Una medida equivale a 12onzas de cerveza, 5onzas de vino o 1onzas de bebidas alcohlicas de alta graduacin. Estilo de vida  Trabaje con su mdico para mantener un peso saludable o Administrator, Civil Service. Pregntele cual es su peso recomendado.  Realice al menos 30 minutos de ejercicio que haga que se acelere su corazn (ejercicio Arboriculturist) la [employer] de la [place]. Estas actividades pueden incluir caminar, nadar o andar en bicicleta.  Incluya ejercicios para fortalecer sus msculos (ejercicios de resistencia), como pilates o levantamiento de pesas, como parte de su rutina semanal de ejercicios. Intente realizar 43minutos de este tipo de ejercicios al [employer] Corporation a la [place].  No consuma ningn producto que contenga nicotina o tabaco, como cigarrillos y Psychologist, sport and exercise. Si necesita ayuda para dejar de fumar, consulte al mdico.  Contrlese la presin arterial en su casa segn las indicaciones del mdico.  Concurra a todas las visitas de control como se lo haya indicado el mdico. Esto es importante. Medicamentos  Delphi de venta libre y los recetados solamente como se lo haya  indicado el [place] indicaciones. Los medicamentos para la presin arterial deben tomarse segn las indicaciones.  No omita las dosis de medicamentos para la presin arterial. Si lo hace, estar en riesgo de tener problemas y puede hacer que los medicamentos sean menos eficaces.  Pregntele a su mdico a qu efectos secundarios o reacciones a los Careers information officer. Comunquese con un mdico si:  Piensa que tiene una reaccin a un medicamento que est tomando.  Tiene dolores de cabeza frecuentes (recurrentes).  Siente mareos.  Tiene hinchazn en los tobillos.  Tiene problemas de visin. Solicite ayuda de inmediato si:  Siente un dolor de cabeza intenso o confusin.  Siente debilidad inusual o  adormecimiento.  Siente que va a desmayarse.  Siente un dolor intenso en el pecho o el abdomen.  Vomita repetidas veces.  Tiene dificultad para respirar. Resumen  La hipertensin se produce cuando la sangre bombea en las arterias con mucha fuerza. Si esta afeccin no se controla, podra correr riesgo de tener complicaciones graves.  La presin arterial deseada puede variar en funcin de las enfermedades, la edad y otros factores personales. Para la [employer], una presin arterial normal es menor que 120/80.  La hipertensin se trata con cambios en el estilo de vida, medicamentos o una combinacin de [place]. Los McDonald's Corporation estilo de vida incluyen prdida de peso, ingerir alimentos sanos, seguir una dieta baja en sodio, hacer ms ejercicio y Environmental consultant consumo de alcohol. Esta informacin no tiene Marine scientist el consejo del mdico. Asegrese de hacerle al mdico cualquier pregunta que tenga. Document Released: 09/23/2005 Document Revised: 09/04/2016 Document Reviewed: 09/04/2016 Elsevier Interactive Patient Education  [employer].

## 2018-07-18 NOTE — MAU Note (Signed)
Not cycling bp per [facility]

## 2018-07-18 NOTE — ED Triage Notes (Addendum)
Pt states her last period was in June, states she took pregnancy tests that were negative. States she had contraction like cramps since Monday and today has profuse bleeding, she just changed her pad and it is already soaked with blood, she also has gushes of blood dripping down her leg when she stands up. She has back  And lower abdominal cramping. *speaks spanish

## 2018-07-18 NOTE — ED Provider Notes (Signed)
Signout from Dr. Eulis Foster.  32 year old female pregnant unclear how far here with vaginal bleeding.  On pelvic exam she had blood in the vault.  Her quant was in the 500s.  She is O+.  She is pending a pelvic ultrasound.  Plan is likely discharge with follow-up in MAU for serial quants.  Clinical Course as of Jul 18 1725  Sat Jul 18, 2018  1536 High  I-stat hCG, quantitative(!): 559.0 [EW]  1536 Normal except white count high, hemoglobin low  CBC(!) [EW]  1536 Normal except sodium low, CO2 low, glucose high, BUN high, creatinine high, total protein low, albumin low, GFR low  Comprehensive metabolic panel(!) [EW]  5797 Blood type O+, no need for RhoGam.  Type and screen [place] [EW]  1610 I reviewed with the husband and he is translating to his wife the results of the ultrasound and her testing.  I explained that this could be a normal pregnancy and just very early in her dates may be off.  Also she might of just had a miscarriage and will need another blood test to identify this.  They also understand the third possibility that this could be an ectopic pregnancy and she will need to follow-up with a specialist.  They understand to go to [facility] if any increased pain or bleeding and if everything is okay they will need to repeat blood test in 2 to 3 days.   [MB]  1640 Husband asks if I could go reexamine her because she had some more bleeding prior to discharge.  Her vault is full of clot and difficult to remove enough to see where the cervix is.   [MB]  2820 Discussed with Dr. Kennon Rounds from women's who recommends that the patient be transferred over to the MA you for evaluation by them.  I have updated the patient and her husband.   [MB]    Clinical Course User Index [EW] Daleen Bo, MD [MB] Hayden Rasmussen, MD      Hayden Rasmussen, MD 07/18/18 1726

## 2018-07-18 NOTE — ED Notes (Signed)
Patient transported to US 

## 2018-07-18 NOTE — MAU Note (Signed)
Renee Porter is a 32 y.o. at Unknown here in MAU reporting: +vaginal bleeding with clots LMP: unknown date; about 2 months ago Onset of complaint: bleeding started at 11am Pain score: denies at this time; comes and goes Patient arrived to mau via [facility] from [facility] 20G iv noted in Left AC with NS infusing. CNM bedside; bleeding assessed and new pad given. Vitals:   07/18/18 1700 07/18/18 1734  BP: (!) 171/93 (!) 175/105  Pulse: 86 88  Resp: 11 16  Temp:  98 F (36.7 C)  SpO2: 100% 99%      Lab orders placed from triage: none

## 2018-07-18 NOTE — ED Notes (Signed)
Pt transferred to [facility] with [facility]

## 2018-07-20 ENCOUNTER — Ambulatory Visit (INDEPENDENT_AMBULATORY_CARE_PROVIDER_SITE_OTHER): Payer: Self-pay | Admitting: General Practice

## 2018-07-20 VITALS — BP 161/84

## 2018-07-20 DIAGNOSIS — I10 Essential (primary) hypertension: Secondary | ICD-10-CM

## 2018-07-20 DIAGNOSIS — N289 Disorder of kidney and ureter, unspecified: Secondary | ICD-10-CM

## 2018-07-20 DIAGNOSIS — O039 Complete or unspecified spontaneous abortion without complication: Secondary | ICD-10-CM

## 2018-07-20 DIAGNOSIS — O3680X0 Pregnancy with inconclusive fetal viability, not applicable or unspecified: Secondary | ICD-10-CM

## 2018-07-20 LAB — HCG, QUANTITATIVE, PREGNANCY: hCG, Beta Chain, Quant, S: 111 m[IU]/mL — ABNORMAL HIGH (ref <5)

## 2018-07-20 MED ORDER — NIFEDIPINE ER OSMOTIC RELEASE 60 MG PO TB24: 60.0000 mg | ORAL_TABLET | Freq: Every day | ORAL | 2 refills | Status: DC

## 2018-07-20 NOTE — Patient Instructions (Signed)
Plan de alimentacin DASH DASH Eating Plan DASH es la sigla en ingls de "Enfoques Alimentarios para Detener la Hipertensin" (Dietary Approaches to Stop Hypertension). El plan de alimentacin DASH ha demostrado bajar la presin arterial elevada (hipertensin). Tambin puede reducir [employer] de diabetes tipo 2, enfermedad cardaca y accidente cerebrovascular. Este plan tambin puede ayudar a Horticulturist, commercial. Consejos para seguir este plan Pautas generales  Evite ingerir ms de 2,300 mg (miligramos) de sal (sodio) por da. Si tiene hipertensin, es posible que necesite reducir la ingesta de sodio a 1,500 mg por da.  Limite el consumo de alcohol a no ms de 53mdida por da si es mujer y no est [place] y 259midas por da si es hombre. Una medida equivale a 12oz (35552mde cerveza, 5oz (148m27me vino o 1oz (44ml43m bebidas alcohlicas de alta graduacin.  Trabaje con su mdico para mantener un peso saludable o perder peso.[place] Mediagntele cul es el peso recomendado para usted.  Realice al menos 30 minutos de ejercicio que haga que se acelere su corazn (ejercicio aerbiArboriculturistmayorHartford Financiala semanBayside Gardensas actividades pueden incluir caminar, nadar o andar en bicicleta.  Trabaje con su mdico o especialista en alimentacin y nutricin (nutricionista) para ajustar su plan alimentario a sus necesidades calricas personales. Lectura de las etiquetas de los alimentos  Verifique en las etiquetas de los alimentos, la cantidad de sodio por porcin. Elija alimentos con menos del 5 por ciento del valor diario de sodio. Generalmente, los alimentos con menos de 300 mg de sodio por porcin se encuadran dentro de este plan alimentario.  Para encontrar cereales integrales, busque la palabra "integral" como primera palabra en la lista de ingredientes. De compras  Compre productos en los que en su etiqueta diga: "bajo contenido de sodio" o "sin agregado de sal".  Compre alimentos frescos. Evite  los alimentos enlatados y comidas precocidas o congeladas. Coccin  Evite agregar sal cuando cocine. Use hierbas o aderezos sin sal, en lugar de sal de mesa o sal marina. Consulte al mdico o farmacutico antes de usar sustitutos de la sal.  No fra los alimentos. A la hora de cocinar los alimentos opte por hornearlos, hervirlos, grillarlos y asarlos a la paAdministrator, artscine con aceites cardiosaludables, como oliva, canola, soja o girasol. Planificacin de las comidas   Consuma una dieta equilibrada, que incluya lo siguiente: ? 5o ms porciones de frutas y verduSet designerte de que la mitad del plato de cada comida sean frutas y verduras. ? Hasta 6 u 8 porciones de cereales integrales por da. ? Menos de 6 onzas de carne, aves o pescado magroGames developer porcin de 3 onzas de carne tiene casi el mismo tamao que un mazo de cartas. Un huevo equivale a 1 onza. ? Dos porciones de productos lcteos descremados por da. ?Training and development officerna porcin de frutos secos, semillas o frijoles 5 veces por semana. ? Grasas cardiosaludables. Las grasas saludables llamadas cidos grasos omega-3 se encuentran en alimentos como semillas de lino y pescados de agua fra, como por ejemplo, sardinas, salmn y caballa.  Limite la cantidad que ingiere de los siguientes alimentos: ? Alimentos enlatados o envasados. ? Alimentos con alto contenido de grasa trans, como alimentos fritos. ? Alimentos con alto contenido de grasa saturada, como carne con grasa. ? Dulces, postres, bebidas azucaradas y otros alimentos con azcar agregada. ? Productos lcteos enteros.  No le agregue sal a los alimentos antes de probarlos.  Trate de comer al  menos 2 comidas vegetarianas por semana.  Consuma ms comida casera y menos de restaurante, de bufs y comida rpida.  Cuando coma en un restaurante, pida que preparen su comida con menos sal o, en lo posible, sin nada de sal. Qu alimentos se recomiendan? Los alimentos enumerados a  continuacin no constituyen Furniture conservator/restorer. Hable con el nutricionista sobre las mejores opciones alimenticias para usted. Cereales Pan de salvado o integral. Pasta de salvado o integral. Arroz integral. Avena. Quinua. Trigo burgol. Cereales integrales y con bajo contenido de sodio. Pan pita. Galletitas de [place] con bajo contenido de [place] y [place]. Tortillas de [place] integral. Verduras Verduras frescas o congeladas (crudas, al vapor, asadas o grilladas). Jugos de tomate y verduras con bajo contenido de sodio o reducidos en sodio. Salsa y pasta de tomate con bajo contenido de sodio o reducidas en sodio. Verduras enlatadas con bajo contenido de sodio o reducidas en sodio. Frutas Todas las frutas frescas, congeladas o disecadas. Frutas enlatadas en jugo natural (sin agregado de azcar). Carne y otros alimentos proteicos Pollo o pavo sin piel. Carne de pollo o de [place]. Cerdo desgrasado. Pescado y [employer]. Claras de huevo. Porotos, guisantes o lentejas secos. Frutos secos, mantequilla de frutos secos y semillas sin sal. Frijoles enlatados sin sal. Cortes de carne vacuna magra, desgrasada. Embutidos magros, con bajo contenido de [place]. Lcteos Leche descremada (1%) o descremada. Quesos sin grasa, con bajo contenido de grasa o descremados. Queso blanco o ricota sin grasa, con bajo contenido de [place]. Yogur semidescremado o descremado. Queso con bajo contenido de [place] y [place]. Grasas y [employer] untables que no contengan grasas trans. Aceite vegetal. Lubertha Basque y aderezos para ensaladas livianos o con bajo contenido de grasas (reducidos en sodio). Aceite de canola, crtamo, oliva, soja y [place]. [place]. Condimentos y otros alimentos Hierbas. Especias. Mezclas de condimentos sin sal. Palomitas de maz y pretzels sin sal. Dulces con bajo contenido de grasas. Qu alimentos no se recomiendan? Los alimentos enumerados a continuacin no constituyen Furniture conservator/restorer. Hable con el  nutricionista sobre las mejores opciones alimenticias para usted. Cereales Productos de panificacin hechos con grasa, como medialunas, magdalenas y algunos panes. Comidas con arroz o pasta seca listas para usar. Verduras Verduras con crema o fritas. Verduras en [place]. Verduras enlatadas regulares (que no sean con bajo contenido de sodio o reducidas en sodio). Pasta y salsa de tomates enlatadas regulares (que no sean con bajo contenido de sodio o reducidas en sodio). Jugos de tomate y verduras regulares (que no sean con bajo contenido de sodio o reducidos en sodio). Pepinillos. Aceitunas. Lambert Mody Fruta enlatada en almbar liviano o espeso. Frutas cocidas en aceite. Frutas con salsa de crema o [place]. Carne y otros alimentos proteicos Cortes de carne con grasa. Costillas. Carne frita. Tocino. Salchichas. Mortadela y otras carnes procesadas. Salame. Panceta. Perros calientes (hotdogs). [place]wn. Frutos secos y semillas con sal. Frijoles enlatados con agregado de sal. Pescado enlatado o ahumado. Huevos enteros o yemas. Pollo o pavo con piel. Lcteos Leche entera o al 2%, crema y mitad leche y mitad crema. Queso crema entero o con toda su grasa. Yogur entero o endulzado. Quesos con toda su grasa. Sustitutos de cremas no lcteas. Coberturas batidas. Quesos para untar y quesos procesados. Grasas y [employer]. Margarina en barra. Dooms. Materia grasa. Mantequilla clarificada. Grasa de panceta. Aceites tropicales como aceite de coco, palmiste o palma. Condimentos y otros alimentos Palomitas de maz y pretzels con sal. Sal de  cebolla, sal de ajo, sal condimentada, sal de mesa y sal marina. Salsa Worcestershire. Salsa trtara. Salsa barbacoa. Salsa teriyaki. Salsa de soja, incluso la que tiene contenido reducido de [place]. Salsa de carne. Salsas en lata y envasadas. Salsa de pescado. Salsa de [place]. Salsa rosada. Rbano picante envasado. [place]. Mostaza. Saborizantes y  tiernizantes para carne. Caldo en cubitos. Salsa picante y salsa tabasco. Escabeches envasados o ya preparados. Aderezos para tacos prefabricados o envasados. Salsas. Aderezos comunes para ensalada. Dnde encontrar ms informacin:  [place], los Pulmones y Herbalist (National Heart, Lung, and [place]): https://wilson-eaton.com/  Asociacin Estadounidense del Corazn (American Heart Association): www.heart.org Resumen  El plan de alimentacin DASH ha demostrado bajar la presin arterial elevada (hipertensin). Tambin puede reducir UnitedHealth de diabetes tipo 2, enfermedad cardaca y accidente cerebrovascular.  Con el plan de alimentacin DASH, deber limitar el consumo de sal (sodio) a 2,300 mg por da. Si tiene hipertensin, es posible que necesite reducir la ingesta de sodio a 1,500 mg por da.  Cuando siga el plan de alimentacin DASH, trate de comer ms frutas frescas y verduras, cereales integrales, carnes magras, lcteos descremados y grasas cardiosaludables.  Trabaje con su mdico o especialista en alimentacin y nutricin (nutricionista) para ajustar su plan alimentario a sus necesidades calricas personales. Esta informacin no tiene Marine scientist el consejo del mdico. Asegrese de hacerle al mdico cualquier pregunta que tenga. Document Released: 09/12/2011 Document Revised: 01/13/2017 Document Reviewed: 01/13/2017 Elsevier Interactive Patient Education  2018 [place] (Miscarriage) El aborto espontneo es la prdida de un beb que no ha nacido.(feto) antes de la semana 20 del embarazo. La causa generalmente es desconocida. CUIDADOS EN EL HOGAR  Debe permanecer en cama (reposo en cama) o podr hacer actividades livianas. Regrese a sus actividades segn las indicaciones del mdico.  Pida ayuda con las tareas domsticas.  Anote cuntos apsitos [place] por da. Describa el grado en que estn empapados.  No use tampones. No se  higienice la vagina (duchas vaginales) ni tenga relaciones sexuales (coito) hasta que el mdico la autorice.  Slo debe tomar la medicacin segn las indicaciones del mdico.  No tome aspirina.  Cumpla con los controles mdicos segn las indicaciones.  Si usted o su pareja tienen problemas con el duelo, hable con su mdico. Tambin puede intentar con psicoterapia. Permtase el tiempo suficiente de duelo antes de quedar embarazada nuevamente.  SOLICITE AYUDA DE INMEDIATO SI:  Siente clicos intensos o dolor en el estmago, en la espalda o en el vientre (abdomen).  Tiene fiebre.  Elimina grumos de sangre (cogulos) por la vagina, que tienen el tamao de una nuez o ms. Guarde los cogulos para que el [employer] vea.  Elimina gran cantidad de tejidos por la vagina. Guarde lo que ha eliminado para que su mdico lo examine.  Aumenta el sangrado.  Observa una secrecin espesa, con mal olor (prdida) que proviene de la vagina.  Se siente mareada, dbil o se desvanece (se desmaya).  Siente escalofros.  ASEGRESE DE QUE:  Comprende estas instrucciones.  Controlar su enfermedad.  Solicitar ayuda de inmediato si no mejora o si empeora.  Esta informacin no tiene Marine scientist el consejo del mdico. Asegrese de hacerle al mdico cualquier pregunta que tenga. Document Released: 03/24/2012 Document Revised: 03/24/2012 Document Reviewed: 10/24/2011 Elsevier Interactive Patient Education  2017 [employer].

## 2018-07-20 NOTE — Assessment & Plan Note (Signed)
F/u BHCG with serum Cr at next visit

## 2018-07-20 NOTE — Progress Notes (Signed)
    Subjective:    Patient ID: Renee Porter is a 32 y.o. female presenting with Labs Only  on 07/20/2018  HPI: Notes that she is not bleeding as heavily. Passed significant blood in [facility]. BHCG has fallen today from 468-->111 cw/ SAB. Still having right sided pain. Has been on lisinopril in the past but not taking regularly. BP is up on labetalol tid, but not taking as needed.  Review of Systems  Constitutional: Negative for chills and fever.  Respiratory: Negative for shortness of breath.   Cardiovascular: Negative for chest pain.  Gastrointestinal: Negative for abdominal pain, nausea and vomiting.  Genitourinary: Negative for dysuria.  Skin: Negative for rash.      Objective:    BP (!) 161/84   LMP 05/21/2018 (Within Weeks)  Physical Exam  Constitutional: She is oriented to person, place, and time. She appears well-developed and well-nourished. No distress.  HENT:  Head: Normocephalic and atraumatic.  Eyes: No scleral icterus.  Neck: Neck supple.  Cardiovascular: Normal rate.  Pulmonary/Chest: Effort normal.  Abdominal: Soft.  Neurological: She is alert and oriented to person, place, and time.  Skin: Skin is warm and dry.  Psychiatric: She has a normal mood and affect.        Assessment & Plan:   Problem List Items Addressed This Visit      Unprioritized   Hypertension    Add procardia--remain on this if trying to conceive. DASH diet information given in Spanish. Advised to seek out PCP for further management.      Relevant Medications   NIFEdipine (PROCARDIA XL) 60 MG 24 hr tablet   SAB (spontaneous abortion)    F/u BHCG with serum Cr at next visit      Kidney disease    Serum Cr 1.93 in [facility]--will repeat this with improved BP control in a few weeks. Hold ACE-I for this.       Other Visit Diagnoses    Pregnancy of unknown anatomic location    -  Primary   Relevant Orders   hCG, quantitative, pregnancy (Completed)      Total face-to-face time  with patient: 20 minutes. Over 50% of encounter was spent on counseling and coordination of care. Return in about 2 weeks (around 08/03/2018) for a follow-up SAB, BP check and repeat labs.  Donnamae Jude 07/20/2018 3:38 PM

## 2018-07-20 NOTE — Assessment & Plan Note (Signed)
Serum Cr 1.93 in [facility]--will repeat this with improved BP control in a few weeks. Hold ACE-I for this.

## 2018-07-20 NOTE — Assessment & Plan Note (Signed)
Add procardia--remain on this if trying to conceive. DASH diet information given in Spanish. Advised to seek out PCP for further management.

## 2018-07-20 NOTE — Progress Notes (Signed)
Patient presents to office today for stat bhcg. Patient reports some hip pain and spotting with wiping. Patient also reports a headache and some dizziness. Patient states she has been taking her BP medication that was prescribed but was only able to take it once yesterday & one time so far today. Discussed with patient we are monitoring your bhcg levels today & asked she wait in lobby for results/updated plan of care. Patient verbalized understanding and had no questions at this time. [employer] interpreter [phone] used for initial encounter.

## 2018-08-05 ENCOUNTER — Ambulatory Visit: Payer: Self-pay | Admitting: Student

## 2018-08-05 ENCOUNTER — Ambulatory Visit: Payer: Self-pay | Admitting: Obstetrics & Gynecology

## 2018-08-10 ENCOUNTER — Ambulatory Visit (INDEPENDENT_AMBULATORY_CARE_PROVIDER_SITE_OTHER): Payer: Self-pay | Admitting: Advanced Practice Midwife

## 2018-08-10 ENCOUNTER — Encounter: Payer: Self-pay | Admitting: Advanced Practice Midwife

## 2018-08-10 VITALS — BP 157/95 | HR 83 | Wt 179.0 lb

## 2018-08-10 DIAGNOSIS — O039 Complete or unspecified spontaneous abortion without complication: Secondary | ICD-10-CM

## 2018-08-10 DIAGNOSIS — I10 Essential (primary) hypertension: Secondary | ICD-10-CM

## 2018-08-10 NOTE — Progress Notes (Signed)
  Subjective:     Patient ID: Renee Porter, female   DOB: 11/22/1985, 32 y.o.   MRN: [id number]  Renee Porter is a 32 y.o. G3P2000 who is here today for SAB follow up. She states that she had 8 days of bleeding, but all the bleeding has stopped. She is taking 60mg  procardia XL, and labetalol 200mg  TID for her blood pressure at this time. She states that she was on lisinopril prior to finding out she was pregnant, but stopped this at some point. She is unsure of when, but states that it was prior to coming to the emergency department with the miscarriage. She denies any complaints today.     Review of Systems  All other systems reviewed and are negative.      Objective:   Physical Exam  Constitutional: She is oriented to person, place, and time. She appears well-developed and well-nourished. No distress.  HENT:  Head: Normocephalic.  Cardiovascular: Normal rate.  Pulmonary/Chest: Effort normal.  Abdominal: Soft.  Neurological: She is alert and oriented to person, place, and time.  Skin: Skin is warm and dry.  Psychiatric: She has a normal mood and affect.  Nursing note and vitals reviewed.      Assessment:     1. Essential hypertension   2. Spontaneous abortion        Plan:      HCG CMET Continue labetalol 200mg  TID and Procardia 60mg  XR qday Will call patient if creatinine is abnormal Patient needs to FU with PCP to manage her blood pressure and for possible referral to nephrology  Consult with Dr. Harolyn Rutherford, will continue current blood pressure management with procardia and labetalol. Will check creatinine and HCG today, and then patient to FU with PCP for further care regarding hypertension and elevated creatinine.

## 2018-08-10 NOTE — Patient Instructions (Addendum)
Primary care follow up  [facility] (will see you even if you do not have sickle cell): [facility]: [place] [facility]: [place] [phone]

## 2018-08-11 LAB — COMPREHENSIVE METABOLIC PANEL
ALT: 29 IU/L (ref 0–32)
AST: 27 IU/L (ref 0–40)
Albumin/Globulin Ratio: 1.4 (ref 1.2–2.2)
Albumin: 4.1 g/dL (ref 3.5–5.5)
Alkaline Phosphatase: 122 IU/L — ABNORMAL HIGH (ref 39–117)
BUN/Creatinine Ratio: 12 (ref 9–23)
BUN: 21 mg/dL — AB (ref 6–20)
Bilirubin Total: <0.2 mg/dL (ref 0.0–1.2)
CALCIUM: 9.4 mg/dL (ref 8.7–10.2)
CO2: 17 mmol/L — AB (ref 20–29)
Chloride: 109 mmol/L — ABNORMAL HIGH (ref 96–106)
Creatinine, Ser: 1.79 mg/dL — ABNORMAL HIGH (ref 0.57–1.00)
GFR, EST AFRICAN AMERICAN: 44 mL/min/{1.73_m2} — AB (ref >59)
GFR, EST NON AFRICAN AMERICAN: 38 mL/min/{1.73_m2} — AB (ref >59)
GLUCOSE: 111 mg/dL — AB (ref 65–99)
Globulin, Total: 2.9 g/dL (ref 1.5–4.5)
Potassium: 4.3 mmol/L (ref 3.5–5.2)
Sodium: 140 mmol/L (ref 134–144)
Total Protein: 7 g/dL (ref 6.0–8.5)

## 2018-08-11 LAB — BETA HCG QUANT (REF LAB): hCG Quant: 1 m[IU]/mL

## 2018-08-12 ENCOUNTER — Telehealth: Payer: Self-pay | Admitting: General Practice

## 2018-08-12 DIAGNOSIS — I10 Essential (primary) hypertension: Secondary | ICD-10-CM

## 2018-08-12 DIAGNOSIS — N289 Disorder of kidney and ureter, unspecified: Secondary | ICD-10-CM

## 2018-08-12 NOTE — Telephone Encounter (Signed)
Called patient with pacific interpreter [phone] & informed her of results and need for follow up with PCP. Patient verbalized understanding and states she currently does not have one. Scheduled appt with [facility] 12/2 @ 330p and informed patient. Patient verbalized understanding and had no questions.

## 2018-08-12 NOTE — Telephone Encounter (Signed)
-----   Message from Tresea Mall, CNM sent at 08/11/2018  9:29 AM EST ----- Please call patient with interpretor, and let her know that her kidney function test is still abnormal. She needs to establish with a PCP.

## 2018-09-07 ENCOUNTER — Ambulatory Visit (INDEPENDENT_AMBULATORY_CARE_PROVIDER_SITE_OTHER): Payer: Self-pay | Admitting: Physician Assistant

## 2018-10-07 NOTE — L&D Delivery Note (Addendum)
Delivery Note At 4:07 PM a viable female was delivered via  (Presentation: LOA ).  APGAR: per infant chart; weight 3 lb 11.3 oz (1680 g). NICU present in room for delivery.  Placenta status: delivered spontaneously intact .  Cord: 3-vessel    Anesthesia: epidural Episiotomy: None Lacerations: None Suture Repair:None Est. Blood Loss (mL):  371mL  Mom to [facility].  Baby to NICU.  [name] [id number], [id number] PM  I saw and evaluated the patient. I agree with the findings and the plan of care as documented in the resident's note. 800 mcg Cytotec given due to brisk bleeding initially noted after delivery of placenta. Cord cut within 30 seconds of delivery by FOB and taken to warmer but eventually able to be on mother's chest for several minutes before taken to NICU.  Postpartum Plan: Will discontinue glucose stabilizer and monitor glucose ACHS and initiate medication as needed. Carb modified diet. Cont Labetalol protocol PRN for elevated BP's; could initiate BP medication as indicated.  Will check Mag and BMP now. Mag again at 2200 and tomorrow morning along with CMP.  Urine output appropriate. No evidence of fluid overload on exam.   [name], MD [facility] Fellow, [facility] for [facility] [place]

## 2019-03-22 ENCOUNTER — Ambulatory Visit (INDEPENDENT_AMBULATORY_CARE_PROVIDER_SITE_OTHER): Payer: Self-pay | Admitting: Primary Care

## 2019-05-20 ENCOUNTER — Encounter (HOSPITAL_COMMUNITY): Payer: Self-pay

## 2019-06-22 ENCOUNTER — Encounter: Payer: Self-pay | Admitting: *Deleted

## 2019-06-22 ENCOUNTER — Other Ambulatory Visit: Payer: Self-pay

## 2019-06-22 ENCOUNTER — Ambulatory Visit (INDEPENDENT_AMBULATORY_CARE_PROVIDER_SITE_OTHER): Payer: Self-pay | Admitting: *Deleted

## 2019-06-22 DIAGNOSIS — N289 Disorder of kidney and ureter, unspecified: Secondary | ICD-10-CM

## 2019-06-22 DIAGNOSIS — O10919 Unspecified pre-existing hypertension complicating pregnancy, unspecified trimester: Secondary | ICD-10-CM | POA: Insufficient documentation

## 2019-06-22 DIAGNOSIS — O0990 Supervision of high risk pregnancy, unspecified, unspecified trimester: Secondary | ICD-10-CM | POA: Insufficient documentation

## 2019-06-22 MED ORDER — LABETALOL HCL 100 MG PO TABS: 100.0000 mg | ORAL_TABLET | Freq: Two times a day (BID) | ORAL | 0 refills | Status: DC

## 2019-06-22 NOTE — Progress Notes (Signed)
10:50 I discussed her hx with Dr. Dione Plover and requested Rx for Labetalol and that I will have pt come in for bp check asap since new ob is 07/08/19. Order given for Labetalol 100 mg BID . I also confirmed patient is Adopt-A-Mom and scheduled [place] at [facility] for 06/28/19 at 12:00. I called Kalika back with [employer], Interpreter and informed her we have sent rx for Labetolol 200 mg po BID to her pharmacy . I also informed her we want her to come in for a blood pressure check 06/24/19 at 0920.  I also reviewed her new ob appointment  With her. She c/o a little edema in one foot but denies pain. Advised her if she has pain in her lower leg or severe headaches to go to [facility] for evaluation. She voices understanding.  Ricky Doan,RN

## 2019-06-22 NOTE — Progress Notes (Signed)
I connected with  Renee Porter on 06/22/19 at  9:30 AM EDT by telephone and verified that I am speaking with the correct person using two identifiers.   I discussed the limitations, risks, security and privacy concerns of performing an evaluation and management service by telephone and the availability of in person appointments. I also discussed with the patient that there may be a patient responsible charge related to this service. The patient expressed understanding and agreed to proceed.  Explained I am completing her New OB Intake today. We discussed Her EDD and that it is unsure- she can't remember when her LMP was and has irregular periods .I explained I have not received records yet from the health department that referred her. She states they did not see her ; only talked to her on the phone and referred her. States pregnancy test done at [facility] . In July- states they told her EDD around December 20 but [place] would confirm that.  states gave copy to [facility] but she has a copy too. . I asked her to bring to office to confirm pregnancy. shje has not had any [place] yet. I explained I will schedule [place] at [place] once we confirm is she is Adopt -A-Mom-she thinks she is Adopt-A-Mom.  I reviewed her allergies, meds, OB History, Medical /Surgical history, and appropriate screenings. I explained we will send her Babyscripts app- app sent to her while on phone.  Explained we will give her a blood pressure cuff in the office at her new ob visit/ Explained  then we will have her take her blood pressure weekly Explained she will have some visits in office and some virtually.  Reviewed appointment date/ time with her , our location and to wear mask, no visitors. Explained she will have exam, ob bloodwork, hemoglobin a1C, cbg , genetic testing if desired, pap if needed. Explained we will schedule an [place] and will call her. She c/o she was given labetolol  100 mg when she went to [place] clinic but she ran out 5  days ago requests a refill. We discussed after her miscarriage in October she was supposed to follow up with her PCP. She states she just took her blood pressure medicines until she ran out and did not see PCP because that was when the [place] started.  I informed her I will ask MD for refill. She c/o occasional headaches and takes tylenol with relief. She states she was told she might need  to  see a kidney doctor but never did. I informed her I will discuss with provider and I will call her back later today .  She voices understanding.  ,RN 06/22/2019  9:31 AM

## 2019-06-24 ENCOUNTER — Other Ambulatory Visit: Payer: Self-pay

## 2019-06-24 ENCOUNTER — Inpatient Hospital Stay (HOSPITAL_COMMUNITY): Payer: Medicaid Other

## 2019-06-24 ENCOUNTER — Encounter (HOSPITAL_COMMUNITY): Payer: Self-pay

## 2019-06-24 ENCOUNTER — Ambulatory Visit (INDEPENDENT_AMBULATORY_CARE_PROVIDER_SITE_OTHER): Payer: Self-pay | Admitting: Lactation Services

## 2019-06-24 ENCOUNTER — Inpatient Hospital Stay (HOSPITAL_COMMUNITY)
Admission: AD | Admit: 2019-06-24 | Discharge: 2019-07-05 | DRG: 806 | Disposition: A | Discharge status: Home / Self Care | Payer: Medicaid Other | Attending: Family Medicine | Admitting: Family Medicine

## 2019-06-24 DIAGNOSIS — Z3A32 32 weeks gestation of pregnancy: Secondary | ICD-10-CM

## 2019-06-24 DIAGNOSIS — O1002 Pre-existing essential hypertension complicating childbirth: Secondary | ICD-10-CM | POA: Diagnosis present

## 2019-06-24 DIAGNOSIS — R7989 Other specified abnormal findings of blood chemistry: Secondary | ICD-10-CM

## 2019-06-24 DIAGNOSIS — Z3490 Encounter for supervision of normal pregnancy, unspecified, unspecified trimester: Secondary | ICD-10-CM

## 2019-06-24 DIAGNOSIS — O24424 Gestational diabetes mellitus in childbirth, insulin controlled: Secondary | ICD-10-CM | POA: Diagnosis present

## 2019-06-24 DIAGNOSIS — O0933 Supervision of pregnancy with insufficient antenatal care, third trimester: Secondary | ICD-10-CM | POA: Diagnosis not present

## 2019-06-24 DIAGNOSIS — O34219 Maternal care for unspecified type scar from previous cesarean delivery: Secondary | ICD-10-CM

## 2019-06-24 DIAGNOSIS — O119 Pre-existing hypertension with pre-eclampsia, unspecified trimester: Secondary | ICD-10-CM

## 2019-06-24 DIAGNOSIS — E1121 Type 2 diabetes mellitus with diabetic nephropathy: Secondary | ICD-10-CM | POA: Diagnosis present

## 2019-06-24 DIAGNOSIS — O0990 Supervision of high risk pregnancy, unspecified, unspecified trimester: Secondary | ICD-10-CM

## 2019-06-24 DIAGNOSIS — O26833 Pregnancy related renal disease, third trimester: Secondary | ICD-10-CM | POA: Diagnosis present

## 2019-06-24 DIAGNOSIS — O1022 Pre-existing hypertensive chronic kidney disease complicating childbirth: Secondary | ICD-10-CM | POA: Diagnosis present

## 2019-06-24 DIAGNOSIS — I129 Hypertensive chronic kidney disease with stage 1 through stage 4 chronic kidney disease, or unspecified chronic kidney disease: Secondary | ICD-10-CM | POA: Diagnosis present

## 2019-06-24 DIAGNOSIS — E1165 Type 2 diabetes mellitus with hyperglycemia: Secondary | ICD-10-CM | POA: Diagnosis present

## 2019-06-24 DIAGNOSIS — Z3493 Encounter for supervision of normal pregnancy, unspecified, third trimester: Secondary | ICD-10-CM

## 2019-06-24 DIAGNOSIS — E8809 Other disorders of plasma-protein metabolism, not elsewhere classified: Secondary | ICD-10-CM | POA: Diagnosis present

## 2019-06-24 DIAGNOSIS — R809 Proteinuria, unspecified: Secondary | ICD-10-CM | POA: Diagnosis present

## 2019-06-24 DIAGNOSIS — Z20828 Contact with and (suspected) exposure to other viral communicable diseases: Secondary | ICD-10-CM | POA: Diagnosis present

## 2019-06-24 DIAGNOSIS — N189 Chronic kidney disease, unspecified: Secondary | ICD-10-CM | POA: Diagnosis present

## 2019-06-24 DIAGNOSIS — N183 Chronic kidney disease, stage 3 (moderate): Secondary | ICD-10-CM | POA: Diagnosis present

## 2019-06-24 DIAGNOSIS — O1493 Unspecified pre-eclampsia, third trimester: Secondary | ICD-10-CM

## 2019-06-24 DIAGNOSIS — O10919 Unspecified pre-existing hypertension complicating pregnancy, unspecified trimester: Secondary | ICD-10-CM

## 2019-06-24 DIAGNOSIS — Z23 Encounter for immunization: Secondary | ICD-10-CM

## 2019-06-24 DIAGNOSIS — I10 Essential (primary) hypertension: Secondary | ICD-10-CM

## 2019-06-24 DIAGNOSIS — O99214 Obesity complicating childbirth: Secondary | ICD-10-CM | POA: Diagnosis present

## 2019-06-24 DIAGNOSIS — N179 Acute kidney failure, unspecified: Secondary | ICD-10-CM

## 2019-06-24 DIAGNOSIS — O10913 Unspecified pre-existing hypertension complicating pregnancy, third trimester: Secondary | ICD-10-CM

## 2019-06-24 DIAGNOSIS — O114 Pre-existing hypertension with pre-eclampsia, complicating childbirth: Principal | ICD-10-CM | POA: Diagnosis present

## 2019-06-24 LAB — COMPREHENSIVE METABOLIC PANEL
ALT: 33 U/L (ref 0–44)
AST: 24 U/L (ref 15–41)
Albumin: 2.2 g/dL — ABNORMAL LOW (ref 3.5–5.0)
Alkaline Phosphatase: 144 U/L — ABNORMAL HIGH (ref 38–126)
Anion gap: 10 (ref 5–15)
BUN: 25 mg/dL — ABNORMAL HIGH (ref 6–20)
CO2: 17 mmol/L — ABNORMAL LOW (ref 22–32)
Calcium: 8.5 mg/dL — ABNORMAL LOW (ref 8.9–10.3)
Chloride: 109 mmol/L (ref 98–111)
Creatinine, Ser: 1.95 mg/dL — ABNORMAL HIGH (ref 0.44–1.00)
GFR calc Af Amer: 39 mL/min — ABNORMAL LOW (ref >60)
GFR calc non Af Amer: 34 mL/min — ABNORMAL LOW (ref >60)
Glucose, Bld: 140 mg/dL — ABNORMAL HIGH (ref 70–99)
Potassium: 4.3 mmol/L (ref 3.5–5.1)
Sodium: 136 mmol/L (ref 135–145)
Total Bilirubin: 0.3 mg/dL (ref 0.3–1.2)
Total Protein: 6 g/dL — ABNORMAL LOW (ref 6.5–8.1)

## 2019-06-24 LAB — URINALYSIS, ROUTINE W REFLEX MICROSCOPIC
Bilirubin Urine: NEGATIVE
Glucose, UA: 50 mg/dL — AB
Ketones, ur: NEGATIVE mg/dL
Leukocytes,Ua: NEGATIVE
Nitrite: NEGATIVE
Protein, ur: >=300 mg/dL — AB
Specific Gravity, Urine: 1.008 (ref 1.005–1.030)
pH: 6 (ref 5.0–8.0)

## 2019-06-24 LAB — CBC
HCT: 39.2 % (ref 36.0–46.0)
Hemoglobin: 13.3 g/dL (ref 12.0–15.0)
MCH: 27.8 pg (ref 26.0–34.0)
MCHC: 33.9 g/dL (ref 30.0–36.0)
MCV: 81.8 fL (ref 80.0–100.0)
Platelets: 260 10*3/uL (ref 150–400)
RBC: 4.79 MIL/uL (ref 3.87–5.11)
RDW: 16.4 % — ABNORMAL HIGH (ref 11.5–15.5)
WBC: 9.3 10*3/uL (ref 4.0–10.5)
nRBC: 0 % (ref 0.0–0.2)

## 2019-06-24 LAB — SARS CORONAVIRUS 2 BY RT PCR (HOSPITAL ORDER, PERFORMED IN ~~LOC~~ HOSPITAL LAB): SARS Coronavirus 2: NEGATIVE

## 2019-06-24 LAB — PROTEIN / CREATININE RATIO, URINE
Creatinine, Urine: 50.47 mg/dL
Protein Creatinine Ratio: 8.8 mg/mg{Cre} — ABNORMAL HIGH (ref 0.00–0.15)
Total Protein, Urine: 444 mg/dL

## 2019-06-24 LAB — HEPATITIS B SURFACE ANTIGEN: Hepatitis B Surface Ag: NEGATIVE

## 2019-06-24 LAB — TYPE AND SCREEN
ABO/RH(D): O POS
Antibody Screen: NEGATIVE

## 2019-06-24 LAB — GLUCOSE TOLERANCE, 1 HOUR: Glucose, 1 Hour GTT: 252 mg/dL — ABNORMAL HIGH (ref 70–140)

## 2019-06-24 MED ORDER — LABETALOL HCL 5 MG/ML IV SOLN: 20.0000 mg | INTRAVENOUS | Status: DC | PRN

## 2019-06-24 MED ORDER — LABETALOL HCL 5 MG/ML IV SOLN
20.0000 mg | INTRAVENOUS | Status: DC | PRN
  Administered 2019-06-24: 13:00:00 20 mg via INTRAVENOUS

## 2019-06-24 MED ORDER — ENOXAPARIN SODIUM 40 MG/0.4ML ~~LOC~~ SOLN
40.0000 mg | SUBCUTANEOUS | Status: DC
  Administered 2019-06-24 – 2019-06-25 (×2): 40 mg via SUBCUTANEOUS
  Filled 2019-06-24 (×2): qty 0.4

## 2019-06-24 MED ORDER — INSULIN ASPART 100 UNIT/ML ~~LOC~~ SOLN
0.0000 [IU] | Freq: Three times a day (TID) | SUBCUTANEOUS | Status: DC
  Administered 2019-06-24: 3 [IU] via SUBCUTANEOUS
  Administered 2019-06-25 (×3): 4 [IU] via SUBCUTANEOUS
  Administered 2019-06-26: 3 [IU] via SUBCUTANEOUS
  Administered 2019-06-26: 2 [IU] via SUBCUTANEOUS
  Administered 2019-06-26: 3 [IU] via SUBCUTANEOUS
  Administered 2019-06-27: 2 [IU] via SUBCUTANEOUS
  Administered 2019-06-27: 3 [IU] via SUBCUTANEOUS

## 2019-06-24 MED ORDER — HYDRALAZINE HCL 20 MG/ML IJ SOLN
10.0000 mg | INTRAMUSCULAR | Status: DC | PRN
  Administered 2019-06-24 – 2019-07-01 (×5): 10 mg via INTRAVENOUS
  Filled 2019-06-24 (×6): qty 1

## 2019-06-24 MED ORDER — HYDRALAZINE HCL 20 MG/ML IJ SOLN
5.0000 mg | INTRAMUSCULAR | Status: DC | PRN
  Administered 2019-06-26 – 2019-06-28 (×3): 5 mg via INTRAVENOUS

## 2019-06-24 MED ORDER — HYDRALAZINE HCL 20 MG/ML IJ SOLN
5.0000 mg | INTRAMUSCULAR | Status: DC | PRN
  Filled 2019-06-24 (×2): qty 1

## 2019-06-24 MED ORDER — HYDRALAZINE HCL 20 MG/ML IJ SOLN: 10.0000 mg | INTRAMUSCULAR | Status: DC | PRN

## 2019-06-24 MED ORDER — LABETALOL HCL 5 MG/ML IV SOLN
20.0000 mg | INTRAVENOUS | Status: DC | PRN
  Administered 2019-06-24 – 2019-06-29 (×3): 20 mg via INTRAVENOUS
  Filled 2019-06-24 (×7): qty 4

## 2019-06-24 MED ORDER — LABETALOL HCL 5 MG/ML IV SOLN
INTRAVENOUS | Status: AC
  Administered 2019-06-24: 11:00:00 20 mg via INTRAVENOUS
  Filled 2019-06-24: qty 4

## 2019-06-24 MED ORDER — BETAMETHASONE SOD PHOS & ACET 6 (3-3) MG/ML IJ SUSP
12.0000 mg | INTRAMUSCULAR | Status: AC
  Administered 2019-06-25: 13:00:00 12 mg via INTRAMUSCULAR
  Filled 2019-06-24 (×2): qty 2

## 2019-06-24 MED ORDER — PRENATAL MULTIVITAMIN CH
1.0000 | ORAL_TABLET | Freq: Every day | ORAL | Status: DC
  Administered 2019-06-24 – 2019-06-28 (×5): 1 via ORAL
  Filled 2019-06-24 (×5): qty 1

## 2019-06-24 MED ORDER — INSULIN ASPART 100 UNIT/ML ~~LOC~~ SOLN: 0.0000 [IU] | Freq: Three times a day (TID) | SUBCUTANEOUS | Status: DC

## 2019-06-24 MED ORDER — LABETALOL HCL 5 MG/ML IV SOLN
80.0000 mg | INTRAVENOUS | Status: DC | PRN
  Administered 2019-06-24 – 2019-07-02 (×5): 80 mg via INTRAVENOUS
  Filled 2019-06-24 (×4): qty 16

## 2019-06-24 MED ORDER — LACTATED RINGERS IV SOLN
INTRAVENOUS | Status: DC
  Administered 2019-06-24 – 2019-06-28 (×3): via INTRAVENOUS

## 2019-06-24 MED ORDER — LABETALOL HCL 5 MG/ML IV SOLN
40.0000 mg | INTRAVENOUS | Status: DC | PRN
  Administered 2019-06-24: 40 mg via INTRAVENOUS

## 2019-06-24 MED ORDER — CALCIUM CARBONATE ANTACID 500 MG PO CHEW: 2.0000 | CHEWABLE_TABLET | ORAL | Status: DC | PRN

## 2019-06-24 MED ORDER — BETAMETHASONE SOD PHOS & ACET 6 (3-3) MG/ML IJ SUSP
12.0000 mg | INTRAMUSCULAR | Status: AC
  Administered 2019-06-24: 17:00:00 12 mg via INTRAMUSCULAR
  Filled 2019-06-24: qty 2

## 2019-06-24 MED ORDER — MAGNESIUM SULFATE BOLUS VIA INFUSION
4.0000 g | Freq: Once | INTRAVENOUS | Status: AC
  Administered 2019-06-24: 14:00:00 4 g via INTRAVENOUS
  Filled 2019-06-24: qty 500

## 2019-06-24 MED ORDER — LABETALOL HCL 5 MG/ML IV SOLN: 40.0000 mg | INTRAVENOUS | Status: DC | PRN

## 2019-06-24 MED ORDER — LABETALOL HCL 5 MG/ML IV SOLN
40.0000 mg | INTRAVENOUS | Status: DC | PRN
  Administered 2019-06-24 – 2019-07-01 (×3): 40 mg via INTRAVENOUS
  Filled 2019-06-24 (×5): qty 8

## 2019-06-24 MED ORDER — DOCUSATE SODIUM 100 MG PO CAPS
100.0000 mg | ORAL_CAPSULE | Freq: Every day | ORAL | Status: DC
  Administered 2019-06-24 – 2019-06-28 (×5): 100 mg via ORAL
  Filled 2019-06-24 (×5): qty 1

## 2019-06-24 MED ORDER — SODIUM CHLORIDE 0.9 % IV SOLN
8.0000 mg | Freq: Once | INTRAVENOUS | Status: AC
  Administered 2019-06-24: 8 mg via INTRAVENOUS
  Filled 2019-06-24: qty 4

## 2019-06-24 MED ORDER — INFLUENZA VAC SPLIT QUAD 0.5 ML IM SUSY
0.5000 mL | PREFILLED_SYRINGE | INTRAMUSCULAR | Status: AC
  Administered 2019-06-25: 0.5 mL via INTRAMUSCULAR
  Filled 2019-06-24 (×2): qty 0.5

## 2019-06-24 MED ORDER — LABETALOL HCL 200 MG PO TABS
200.0000 mg | ORAL_TABLET | Freq: Three times a day (TID) | ORAL | Status: DC
  Administered 2019-06-24: 16:00:00 200 mg via ORAL
  Filled 2019-06-24: qty 1

## 2019-06-24 MED ORDER — MAGNESIUM SULFATE 40 G IN LACTATED RINGERS - SIMPLE
2.0000 g/h | INTRAVENOUS | Status: DC
  Administered 2019-06-24: 2 g/h via INTRAVENOUS
  Filled 2019-06-24: qty 500

## 2019-06-24 MED ORDER — LABETALOL HCL 200 MG PO TABS
300.0000 mg | ORAL_TABLET | Freq: Three times a day (TID) | ORAL | Status: DC
  Administered 2019-06-24 – 2019-06-25 (×2): 300 mg via ORAL
  Filled 2019-06-24 (×2): qty 1

## 2019-06-24 MED ORDER — ACETAMINOPHEN 325 MG PO TABS
650.0000 mg | ORAL_TABLET | ORAL | Status: DC | PRN
  Administered 2019-06-24 – 2019-06-29 (×6): 650 mg via ORAL
  Filled 2019-06-24 (×7): qty 2

## 2019-06-24 MED ORDER — HYDRALAZINE HCL 20 MG/ML IJ SOLN
10.0000 mg | INTRAMUSCULAR | Status: DC | PRN
  Administered 2019-06-26: 10 mg via INTRAVENOUS

## 2019-06-24 NOTE — Progress Notes (Addendum)
Pt in for BP Check, She reports she ran out of her BP meds and did not take for 6 days and then was able to start taking again yesterday.   Pt had a HA on Monday and she took Tylenol (1/2 of 500 mg tablet) and it went away. Pt denies blurred vision, dizziness or seeing spots. Pt with swelling to both legs, ankles and feet with left foot more pronounced. Dr. Hulan Fray elicited hyperactive reflexes on exam.   Pt has a proof of pregnancy from July 8. Pt reports she has irregular periods. Pt reports some very light bleeding in February, pt is very unsure of dates. Pt has been feeling the baby move for 2 months. This is patients 3rd baby. Pt has had no PNC for this pregnancy.   Dr. Hulan Fray was informed of BP. She came into room to assess pt . She measured pt at 36 weeks. Pt was [facility] [place] [facility]. Anderson Malta Rasch called and informed MAU.   Raquel, Spanish Interpreter was in the room during visit and walked pt to the car to tell husband to get pt to the Hospital immediately.

## 2019-06-24 NOTE — Progress Notes (Signed)
@  2250 this RN helped Pt to bathroom with pt's husband. Pt reported feeling dizzy and she was unsteady on her feet. CBG was checked and was 139. DTRs were diminished. RN called Dr Roselie Awkward @2306  and he ordered for the Magnesium Sulfate be stopped.

## 2019-06-24 NOTE — MAU Note (Signed)
Renee Porter is a 33 y.o. at [redacted]w[redacted]d here in MAU reporting: no PNC, went to the HD to fill out some paperwork but has not had any visit. EDD is based on LMP but her periods are irregular. Was sent over from the office today for HTN. Is on Labetalol for BP, took her dose this AM but went 6 days without medication. States no headache, blurry vision, or epigastric pain. No ctx, LOF, or vaginal bleeding. +FM  Pain score: 0/10  Vitals:   06/24/19 1023  BP: (!) 253/139  Pulse: 88  Resp: 18  Temp: 100 F (37.8 C)  SpO2: 98%      Lab orders placed from triage: UA

## 2019-06-24 NOTE — MAU Note (Signed)
Pt reporting some difficulty breathing during magnesium bolus, infusion paused and [place] notified. CNM states this can be a normal reaction and to restart infusion. O2 sat 100, lung sounds clear, infusion restarted.

## 2019-06-24 NOTE — MAU Note (Signed)
Informed [facility] CNM and Dr constant that BP are increasing and patient had 2 severe range. Dr Elly Modena states to repeat the protocol starting with labetalol and [facility] CNM will enter those orders in

## 2019-06-24 NOTE — H&P (Signed)
ANTEPARTUM ADMISSION HISTORY AND PHYSICAL NOTE   History of Present Illness:  Patient Renee Porter is a 33 y.o.  [id number] at uncertain gestation here for evaluation in MAU for possible pre-e vs. SIPE vs. GHTN vs. CHTN. Past history shows that patient has had elevated BP in the past outside of pregnancy (per Dr. Kennon Rounds).   She had a short visit at [facility] but was referred to [facility] due to her high risk status. She has not had any prenatal care this pregnancy and does not know how far along she is due to very irregular periods.   Chart review shows she was given RX for 100 mg of labetalol twice a day per note from 06-22-2019, however she was only taking it once a day. . She has not taken her medicine for the past 6 days because she "ran out". She started taking it again yesterday, this time twice a day.    See note from [place] about patient's visit today (06-24-2019). Per that note, patient was hyperreflexive.   She feels active fetal movements; denies blurry vision, LOF, RUQ pain, headache, although she had a headache earlier in the week which she treated with Tylenol.   She reports that she had a c-section in 2008, and NSVD in 2011 at [facility].  She desires TOLAC.     Patient reports the fetal movement as active. Patient reports uterine contraction  activity as none. Patient reports  vaginal bleeding as none. Patient describes fluid per vagina as None. Fetal presentation is unsure.  Patient Active Problem List   Diagnosis Date Noted  . Supervision of high risk pregnancy, antepartum 06/22/2019  . Chronic hypertension affecting pregnancy 06/22/2019  . Hypertension 07/20/2018  . SAB (spontaneous abortion) 07/20/2018  . Kidney disease 07/20/2018    Past Medical History:  Diagnosis Date  . Hypertension     Past Surgical History:  Procedure Laterality Date  . CESAREAN SECTION    . FOOT SURGERY      OB History  Gravida Para Term Preterm AB Living  4 2 2   1 2   SAB TAB  Ectopic Multiple Live Births  1       2    # Outcome Date GA Lbr Len/2nd Weight Sex Delivery Anes PTL Lv  4 Current           3 SAB 07/2018          2 Term 12/29/09 [redacted]w[redacted]d   F Vag-Spont EPI  LIV     Birth Comments: VBAC  1 Term 01/02/07 [redacted]w[redacted]d   F CS-LTranv EPI  LIV     Birth Comments: c/s for " Big Baby"     Social History   Socioeconomic History  . Marital status: Single    Spouse name: Not on file  . Number of children: Not on file  . Years of education: Not on file  . Highest education level: Not on file  Occupational History  . Not on file  Social Needs  . Financial resource strain: Not on file  . Food insecurity    Worry: Never true    Inability: Never true  . Transportation needs    Medical: No    Non-medical: No  Tobacco Use  . Smoking status: Never Smoker  . Smokeless tobacco: Never Used  Substance and Sexual Activity  . Alcohol use: Never    Frequency: Never  . Drug use: Never  . Sexual activity: Yes    Birth control/protection: None  Lifestyle  . Physical activity    Days per week: Not on file    Minutes per session: Not on file  . Stress: Not on file  Relationships  . Social Herbalist on phone: Not on file    Gets together: Not on file    Attends religious service: Not on file    Active member of club or organization: Not on file    Attends meetings of clubs or organizations: Not on file    Relationship status: Not on file  Other Topics Concern  . Not on file  Social History Narrative  . Not on file    Family History  Problem Relation Age of Onset  . Diabetes Father     No Known Allergies  Medications Prior to Admission  Medication Sig Dispense Refill Last Dose  . acetaminophen (TYLENOL) 500 MG tablet Take 500 mg by mouth every 6 (six) hours as needed for mild pain.     Marland Kitchen labetalol (NORMODYNE) 100 MG tablet Take 100 mg by mouth daily.     Marland Kitchen labetalol (NORMODYNE) 100 MG tablet Take 1 tablet (100 mg total) by mouth 2 (two) times  daily. 60 tablet 0   . labetalol (NORMODYNE) 200 MG tablet Take 1 tablet (200 mg total) by mouth 3 (three) times daily. (Patient not taking: Reported on 06/24/2019) 90 tablet 1   . NIFEdipine (PROCARDIA XL) 60 MG 24 hr tablet Take 1 tablet (60 mg total) by mouth daily. (Patient not taking: Reported on 06/24/2019) 30 tablet 2   . Prenatal Vit-Fe Fumarate-FA (PRENATAL VITAMINS PO) Take 1 tablet by mouth daily.     . traMADol (ULTRAM) 50 MG tablet Take 1-2 tablets (50-100 mg total) by mouth every 6 (six) hours as needed for severe pain. (Patient not taking: Reported on 08/10/2018) 30 tablet 0     Review of Systems - History obtained from the patient  Vitals:  BP (!) 165/100   Pulse 87   Temp 100 F (37.8 C) (Oral)   Resp 18   LMP  (LMP Unknown)   SpO2 99%  Physical Examination: CONSTITUTIONAL: Well-developed, well-nourished female in no acute distress.  HENT:  Normocephalic, atraumatic, External right and left ear normal. Oropharynx is clear and moist EYES: Conjunctivae and EOM are normal. Pupils are equal, round, and reactive to light. No scleral icterus.  NECK: Normal range of motion, supple, no masses SKIN: Skin is warm and dry. No rash noted. Not diaphoretic. No erythema. No pallor. [place]: Alert and oriented to person, place, and time. Normal reflexes, muscle tone coordination. No cranial nerve deficit noted. PSYCHIATRIC: Normal mood and affect. Normal behavior. Normal judgment and thought content. CARDIOVASCULAR: Normal heart rate noted, regular rhythm RESPIRATORY: Effort and breath sounds normal, no problems with respiration noted ABDOMEN: Soft, nontender, nondistended, gravid. MUSCULOSKELETAL: Normal range of motion. No edema and no tenderness. 2+ distal pulses.  Cervix: Not evaluated. and found to be not evaluated/Membranes:intact Fetal Monitoring:Baseline: 145 bpm, mod var, 10x10 acels, no decels.  Tocometer: Flat  Labs:  Results for orders placed or performed during the  hospital encounter of 06/24/19 (from the past 24 hour(s))  Urinalysis, Routine w reflex microscopic   Collection Time: 06/24/19 10:05 AM  Result Value Ref Range   Color, Urine YELLOW YELLOW   APPearance CLEAR CLEAR   Specific Gravity, Urine 1.008 1.005 - 1.030   pH 6.0 5.0 - 8.0   Glucose, UA 50 (A) NEGATIVE mg/dL   Hgb urine dipstick SMALL (  A) NEGATIVE   Bilirubin Urine NEGATIVE NEGATIVE   Ketones, ur NEGATIVE NEGATIVE mg/dL   Protein, ur >=300 (A) NEGATIVE mg/dL   Nitrite NEGATIVE NEGATIVE   Leukocytes,Ua NEGATIVE NEGATIVE   RBC / HPF 0-5 0 - 5 RBC/hpf   WBC, UA 0-5 0 - 5 WBC/hpf   Bacteria, UA RARE (A) NONE SEEN   Squamous Epithelial / LPF 0-5 0 - 5  Type and screen [place]   Collection Time: 06/24/19 10:33 AM  Result Value Ref Range   ABO/RH(D) O POS    Antibody Screen NEG    Sample Expiration      06/27/2019,2359 Performed at [facility], [place] [address], [place], [place] [address]   Comprehensive metabolic panel   Collection Time: 06/24/19 10:41 AM  Result Value Ref Range   Sodium 136 135 - 145 mmol/L   Potassium 4.3 3.5 - 5.1 mmol/L   Chloride 109 98 - 111 mmol/L   CO2 17 (L) 22 - 32 mmol/L   Glucose, Bld 140 (H) 70 - 99 mg/dL   BUN 25 (H) 6 - 20 mg/dL   Creatinine, Ser 1.95 (H) 0.44 - 1.00 mg/dL   Calcium 8.5 (L) 8.9 - 10.3 mg/dL   Total Protein 6.0 (L) 6.5 - 8.1 g/dL   Albumin 2.2 (L) 3.5 - 5.0 g/dL   AST 24 15 - 41 U/L   ALT 33 0 - 44 U/L   Alkaline Phosphatase 144 (H) 38 - 126 U/L   Total Bilirubin 0.3 0.3 - 1.2 mg/dL   GFR calc non Af Amer 34 (L) >60 mL/min   GFR calc Af Amer 39 (L) >60 mL/min   Anion gap 10 5 - 15  CBC   Collection Time: 06/24/19 10:41 AM  Result Value Ref Range   WBC 9.3 4.0 - 10.5 K/uL   RBC 4.79 3.87 - 5.11 MIL/uL   Hemoglobin 13.3 12.0 - 15.0 g/dL   HCT 39.2 36.0 - 46.0 %   MCV 81.8 80.0 - 100.0 fL   MCH 27.8 26.0 - 34.0 pg   MCHC 33.9 30.0 - 36.0 g/dL   RDW 16.4 (H) 11.5 - 15.5 %   Platelets 260  150 - 400 K/uL   nRBC 0.0 0.0 - 0.2 %  SARS Coronavirus 2 [facility] order, Performed in [place] hospital lab) Nasopharyngeal Nasopharyngeal Swab   Collection Time: 06/24/19 11:19 AM   Specimen: Nasopharyngeal Swab  Result Value Ref Range   SARS Coronavirus 2 NEGATIVE NEGATIVE    Imaging Studies: No results found.   Assessment and Plan: Patient Active Problem List   Diagnosis Date Noted  . Supervision of high risk pregnancy, antepartum 06/22/2019  . Chronic hypertension affecting pregnancy 06/22/2019  . Hypertension 07/20/2018  . SAB (spontaneous abortion) 07/20/2018  . Kidney disease 07/20/2018   MAU Course:  IV immediately started and initiated Pre-e protocol. Attending made aware of patient's acuity. Patient denies s/s of pre-e; received 3 doses of IV labetalol and 1 dose of hydralazine, which lowered pressures to 160-150/89-90s, although patient vomited after pressures were dropped.  However, now at 1245, patient is again having severe range pressures. Will start new cycle of anti-hypertensives; Dr. Elly Modena will place orders.    -labs: platelets stable, LFTS normal -creatinine is 1.95, although patient has diagnosis of CKD.  -COVID 19 swab is negative -will do one hour  GTT in hospital -hold BMZ until patient has completed GTT -other cultures to be collected - Dr. Elly Modena will  follow up with MFM regarding possible fetal anomalies.    At 1301: PCR still in process   Admit to [place], [place], [facility]

## 2019-06-24 NOTE — Consult Note (Signed)
Renee Porter Admit Date: 06/24/2019 06/24/2019 Rexene Agent Requesting Physician:  Constant MD  Reason for Consult: Pregnancy, renal insufficiency, hypertension HPI:  33 year old female who was admitted today to the [facility] after presenting to the outpatient clinic with elevated blood pressures.  She appears to be late in the third trimester.  Of note the patient has some history of chronic renal insufficiency, with a creatinine of 1.8 and 1.9 in November and October of last year.  Further, she has evidence of hematuria and proteinuria dating back as far as 2011.  She has never seen a nephrologist.  She has a family history of lupus and a half sister but no history of ESRD, CKD, or transplant.  She has not been taking nonsteroidals.  She denies any sudsy/bubbly urine.  She states that she only has edema when she is pregnant, and it is rather mild.  Patient has been admitted for work-up and management of hypertension.  She is received labetalol and as needed hydralazine.  Creatinine is 1.95.  She has a serum bicarbonate of 17 with an anion gap of 10.  Transaminases are normal.  Platelet count is normal.  Urine studies here have a UPC of 8.8, no hematuria on microscopy, 4+ proteinuria, no pyuria.  Serum albumin is 2.2  She denies rashes, hair loss, arthralgias.  No history of diabetes.    Creatinine, Ser (mg/dL)  Date Value  06/24/2019 1.95 (H)  08/10/2018 1.79 (H)  07/18/2018 1.93 (H)  ]  ROS Balance of 12 systems is negative w/ exceptions as above  PMH  Past Medical History:  Diagnosis Date  . Hypertension    PSH  Past Surgical History:  Procedure Laterality Date  . CESAREAN SECTION    . FOOT SURGERY     FH  Family History  Problem Relation Age of Onset  . Diabetes Father    SH  reports that she has never smoked. She has never used smokeless tobacco. She reports that she does not drink alcohol or use drugs. Allergies No Known Allergies Home  medications Prior to Admission medications   Medication Sig Start Date End Date Taking? Authorizing Provider  acetaminophen (TYLENOL) 500 MG tablet Take 500 mg by mouth every 6 (six) hours as needed for mild pain.    [provider]  labetalol (NORMODYNE) 100 MG tablet Take 100 mg by mouth daily. 05/18/19   [provider]  labetalol (NORMODYNE) 100 MG tablet Take 1 tablet (100 mg total) by mouth 2 (two) times daily. 06/22/19   Clarnce Flock, MD  labetalol (NORMODYNE) 200 MG tablet Take 1 tablet (200 mg total) by mouth 3 (three) times daily. Patient not taking: Reported on 06/24/2019 07/18/18   Tamala Julian, [place], CNM  NIFEdipine (PROCARDIA XL) 60 MG 24 hr tablet Take 1 tablet (60 mg total) by mouth daily. Patient not taking: Reported on 06/24/2019 07/20/18   Donnamae Jude, MD  Prenatal Vit-Fe Fumarate-FA (PRENATAL VITAMINS PO) Take 1 tablet by mouth daily.    [provider]  traMADol (ULTRAM) 50 MG tablet Take 1-2 tablets (50-100 mg total) by mouth every 6 (six) hours as needed for severe pain. Patient not taking: Reported on 08/10/2018 07/18/18   Tamala Julian [place], CNM    Current Medications Scheduled Meds: . betamethasone acetate-betamethasone sodium phosphate  12 mg Intramuscular Q24 Hr x 2  . betamethasone acetate-betamethasone sodium phosphate  12 mg Intramuscular Q24H  . docusate sodium  100 mg Oral Daily  . enoxaparin (LOVENOX) injection  40 mg Subcutaneous Q24H  . [START ON 06/25/2019] influenza vac split quadrivalent PF  0.5 mL Intramuscular Tomorrow-1000  . labetalol  200 mg Oral TID  . prenatal multivitamin  1 tablet Oral Q1200   Continuous Infusions: . lactated ringers 100 mL/hr at 06/24/19 1502  . magnesium sulfate 2 g/hr (06/24/19 1410)   PRN Meds:.acetaminophen, calcium carbonate, labetalol **AND** labetalol **AND** labetalol **AND** hydrALAZINE **AND** Measure blood pressure, hydrALAZINE **AND** hydrALAZINE **AND** labetalol **AND** labetalol  **AND** Measure blood pressure, hydrALAZINE **AND** hydrALAZINE **AND** labetalol **AND** labetalol **AND** Measure blood pressure  CBC Recent Labs  Lab 06/24/19 1041  WBC 9.3  HGB 13.3  HCT 39.2  MCV 81.8  PLT [id number]   Basic Metabolic Panel Recent Labs  Lab 06/24/19 1041  NA 136  K 4.3  CL 109  CO2 17*  GLUCOSE 140*  BUN 25*  CREATININE 1.95*  CALCIUM 8.5*    Physical Exam  Blood pressure (!) 151/88, pulse 83, temperature 98.2 F (36.8 C), temperature source Oral, resp. rate 18, SpO2 99 %, unknown if currently breastfeeding. GEN: NAD, young female, lying in bed ENT: NCAT EYES: EOMI CV: Regular, normal S1 and S2, no rub, no murmur PULM: Clear bilaterally, normal work of ABD: Pregnancy noted, soft SKIN: No rash or lesions EXT: Trace to 1+ lower extremity edema   Assessment 33 year old female in third trimester of pregnancy presenting with uncontrolled hypertension, chronic renal insufficiency, nephrotic range proteinuria and hypoalbuminemia.  Thankfully her GFR has not changed much during this pregnancy.  She is relatively well compensated, not tremendously edematous at the current time.    1. CKD 3 with nephrotic range proteinuria, predating pregnancy, unclear etiology 2. Hypertension, uncontrolled, just charted labetalol, had been off of her outpatient medications 3. Hypoalbuminemia, nephrotic range proteinuria, edema= nephrotic syndrome 4. Third trimester pregnancy, little prenatal care to this point 5. Normal anion gap metabolic acidosis  Plan 1. Will send off serologies at this point 2. Renal US 3. Renal biopsy would be best after pregnancy, no indication to do it sooner given stable GFR; but almost certainly will be necessary 4. Daily weights, Daily Renal Panel, Strict I/Os, Avoid nephrotoxins (NSAIDs, judicious IV Contrast)    Rexene Agent  [phone] pgr 06/24/2019, 4:22 PM

## 2019-06-25 ENCOUNTER — Encounter (HOSPITAL_COMMUNITY): Payer: Self-pay | Admitting: *Deleted

## 2019-06-25 DIAGNOSIS — O10913 Unspecified pre-existing hypertension complicating pregnancy, third trimester: Secondary | ICD-10-CM

## 2019-06-25 DIAGNOSIS — N183 Chronic kidney disease, stage 3 (moderate): Secondary | ICD-10-CM

## 2019-06-25 DIAGNOSIS — O1493 Unspecified pre-eclampsia, third trimester: Secondary | ICD-10-CM | POA: Diagnosis not present

## 2019-06-25 DIAGNOSIS — Z3A32 32 weeks gestation of pregnancy: Secondary | ICD-10-CM

## 2019-06-25 DIAGNOSIS — N189 Chronic kidney disease, unspecified: Secondary | ICD-10-CM

## 2019-06-25 LAB — HIV ANTIBODY (ROUTINE TESTING W REFLEX): HIV Screen 4th Generation wRfx: NONREACTIVE

## 2019-06-25 LAB — CBC
HCT: 38.1 % (ref 36.0–46.0)
Hemoglobin: 12.6 g/dL (ref 12.0–15.0)
MCH: 27 pg (ref 26.0–34.0)
MCHC: 33.1 g/dL (ref 30.0–36.0)
MCV: 81.6 fL (ref 80.0–100.0)
Platelets: 295 10*3/uL (ref 150–400)
RBC: 4.67 MIL/uL (ref 3.87–5.11)
RDW: 17.1 % — ABNORMAL HIGH (ref 11.5–15.5)
WBC: 9.7 10*3/uL (ref 4.0–10.5)
nRBC: 0 % (ref 0.0–0.2)

## 2019-06-25 LAB — HEMOGLOBIN A1C
Hgb A1c MFr Bld: 6.1 % — ABNORMAL HIGH (ref 4.8–5.6)
Mean Plasma Glucose: 128 mg/dL

## 2019-06-25 LAB — RPR: RPR Ser Ql: NONREACTIVE

## 2019-06-25 LAB — HCV INTERPRETATION

## 2019-06-25 LAB — GLUCOSE, CAPILLARY
Glucose-Capillary: 133 mg/dL — ABNORMAL HIGH (ref 70–99)
Glucose-Capillary: 129 mg/dL — ABNORMAL HIGH (ref 70–99)
Glucose-Capillary: 146 mg/dL — ABNORMAL HIGH (ref 70–99)
Glucose-Capillary: 189 mg/dL — ABNORMAL HIGH (ref 70–99)
Glucose-Capillary: 146 mg/dL — ABNORMAL HIGH (ref 70–99)
Glucose-Capillary: 188 mg/dL — ABNORMAL HIGH (ref 70–99)
Glucose-Capillary: 185 mg/dL — ABNORMAL HIGH (ref 70–99)

## 2019-06-25 LAB — C4 COMPLEMENT: Complement C4, Body Fluid: 18 mg/dL (ref 14–44)

## 2019-06-25 LAB — ANA: Anti Nuclear Antibody (ANA): POSITIVE — AB

## 2019-06-25 LAB — C3 COMPLEMENT: C3 Complement: 132 mg/dL (ref 82–167)

## 2019-06-25 LAB — ANTI-SMITH ANTIBODY: ENA SM Ab Ser-aCnc: <0.2 AI (ref 0.0–0.9)

## 2019-06-25 LAB — HCV AB W REFLEX TO QUANT PCR: HCV Ab: <0.1 s/co ratio (ref 0.0–0.9)

## 2019-06-25 MED ORDER — INSULIN ASPART 100 UNIT/ML ~~LOC~~ SOLN
7.0000 [IU] | Freq: Three times a day (TID) | SUBCUTANEOUS | Status: DC
  Administered 2019-06-25 – 2019-06-28 (×9): 7 [IU] via SUBCUTANEOUS

## 2019-06-25 MED ORDER — LABETALOL HCL 200 MG PO TABS
400.0000 mg | ORAL_TABLET | Freq: Three times a day (TID) | ORAL | Status: DC
  Administered 2019-06-25 – 2019-06-26 (×3): 400 mg via ORAL
  Filled 2019-06-25 (×3): qty 2

## 2019-06-25 MED ORDER — INSULIN NPH (HUMAN) (ISOPHANE) 100 UNIT/ML ~~LOC~~ SUSP
18.0000 [IU] | Freq: Every day | SUBCUTANEOUS | Status: DC
  Filled 2019-06-25: qty 10

## 2019-06-25 MED ORDER — INSULIN NPH (HUMAN) (ISOPHANE) 100 UNIT/ML ~~LOC~~ SUSP
7.0000 [IU] | Freq: Every day | SUBCUTANEOUS | Status: DC
  Administered 2019-06-25: 23:00:00 7 [IU] via SUBCUTANEOUS
  Filled 2019-06-25 (×2): qty 10

## 2019-06-25 NOTE — Progress Notes (Addendum)
Patient ID: Renee Porter, female   DOB: [birth year], 33 y.o.   MRN: [id number] [place]) NOTE  Renee Porter is a 33 y.o. [id number] with Estimated Date of Delivery: 08/19/19   By  06/24/19 ultrasound at [redacted]w[redacted]d  who is admitted for [facility], CKD and ? Superimposed preeclampsia.    Fetal presentation is unsure. Length of Stay:  1  Days  Date of admission:06/24/2019  Subjective: Patient reports feeling well. She denies HA, visual changes, RUQ/epigastric pain, nausea or emesis Patient reports the fetal movement as active. Patient reports uterine contraction  activity as none. Patient reports  vaginal bleeding as none. Patient describes fluid per vagina as None.  Vitals:  Blood pressure 126/70, pulse 79, temperature 97.7 F (36.5 C), temperature source Oral, resp. rate 16, height 4\' 8"  (1.422 m), weight 85.2 kg, SpO2 100 %, unknown if currently breastfeeding. Vitals:   06/25/19 0158 06/25/19 0520 06/25/19 0534 06/25/19 0753  BP: 119/68 (!) 161/85 (!) 150/86 126/70  Pulse: 75 78 87 79  Resp:  16    Temp:  97.8 F (36.6 C)  97.7 F (36.5 C)  TempSrc:  Oral  Oral  SpO2:  98%  100%  Weight:  85.2 kg    Height:       Physical Examination:  GENERAL: Well-developed, well-nourished female in no acute distress.  LUNGS: Clear to auscultation bilaterally.  HEART: Regular rate and rhythm. ABDOMEN: Soft, nontender, gravid PELVIC: Not indicated EXTREMITIES: No cyanosis, clubbing, or edema, 2+ distal pulses.   Fetal Monitoring:  Baseline 125, min variability, +accels, no decels     Labs:  Results for orders placed or performed during the hospital encounter of 06/24/19 (from the past 24 hour(s))  Type and screen [place]   Collection Time: 06/24/19 10:33 AM  Result Value Ref Range   ABO/RH(D) O POS    Antibody Screen NEG    Sample Expiration      06/27/2019,2359 Performed at [facility] [address], [place], [place]  [address]   Comprehensive metabolic panel   Collection Time: 06/24/19 10:41 AM  Result Value Ref Range   Sodium 136 135 - 145 mmol/L   Potassium 4.3 3.5 - 5.1 mmol/L   Chloride 109 98 - 111 mmol/L   CO2 17 (L) 22 - 32 mmol/L   Glucose, Bld 140 (H) 70 - 99 mg/dL   BUN 25 (H) 6 - 20 mg/dL   Creatinine, Ser 1.95 (H) 0.44 - 1.00 mg/dL   Calcium 8.5 (L) 8.9 - 10.3 mg/dL   Total Protein 6.0 (L) 6.5 - 8.1 g/dL   Albumin 2.2 (L) 3.5 - 5.0 g/dL   AST 24 15 - 41 U/L   ALT 33 0 - 44 U/L   Alkaline Phosphatase 144 (H) 38 - 126 U/L   Total Bilirubin 0.3 0.3 - 1.2 mg/dL   GFR calc non Af Amer 34 (L) >60 mL/min   GFR calc Af Amer 39 (L) >60 mL/min   Anion gap 10 5 - 15  CBC   Collection Time: 06/24/19 10:41 AM  Result Value Ref Range   WBC 9.3 4.0 - 10.5 K/uL   RBC 4.79 3.87 - 5.11 MIL/uL   Hemoglobin 13.3 12.0 - 15.0 g/dL   HCT 39.2 36.0 - 46.0 %   MCV 81.8 80.0 - 100.0 fL   MCH 27.8 26.0 - 34.0 pg   MCHC 33.9 30.0 - 36.0 g/dL   RDW 16.4 (H) 11.5 - 15.5 %  Platelets 260 150 - 400 K/uL   nRBC 0.0 0.0 - 0.2 %  SARS Coronavirus 2 [facility] order, Performed in [facility] hospital lab) Nasopharyngeal Nasopharyngeal Swab   Collection Time: 06/24/19 11:19 AM   Specimen: Nasopharyngeal Swab  Result Value Ref Range   SARS Coronavirus 2 NEGATIVE NEGATIVE  Glucose tolerance, 1 hour   Collection Time: 06/24/19  4:03 PM  Result Value Ref Range   Glucose, 1 Hour GTT 252 (H) 70 - 140 mg/dL  Hemoglobin A1c   Collection Time: 06/24/19  4:03 PM  Result Value Ref Range   Hgb A1c MFr Bld 6.1 (H) 4.8 - 5.6 %   Mean Plasma Glucose 128 mg/dL  C3 complement   Collection Time: 06/24/19  5:12 PM  Result Value Ref Range   C3 Complement 132 82 - 167 mg/dL  C4 complement   Collection Time: 06/24/19  5:12 PM  Result Value Ref Range   Complement C4, Body Fluid 18 14 - 44 mg/dL  HIV Antibody (routine testing w rflx)   Collection Time: 06/24/19  5:12 PM  Result Value Ref Range   HIV Screen 4th Generation  wRfx Non Reactive Non Reactive  HCV Ab Reflex to Quant PCR   Collection Time: 06/24/19  5:12 PM  Result Value Ref Range   HCV Ab <0.1 0.0 - 0.9 s/co ratio  Hepatitis B surface antigen   Collection Time: 06/24/19  5:12 PM  Result Value Ref Range   Hepatitis B Surface Ag Negative Negative  Interpretation:   Collection Time: 06/24/19  5:12 PM  Result Value Ref Range   HCV Interp 1: Comment   Glucose, capillary   Collection Time: 06/25/19  5:23 AM  Result Value Ref Range   Glucose-Capillary 146 (H) 70 - 99 mg/dL  CBC   Collection Time: 06/25/19  6:20 AM  Result Value Ref Range   WBC 9.7 4.0 - 10.5 K/uL   RBC 4.67 3.87 - 5.11 MIL/uL   Hemoglobin 12.6 12.0 - 15.0 g/dL   HCT 38.1 36.0 - 46.0 %   MCV 81.6 80.0 - 100.0 fL   MCH 27.0 26.0 - 34.0 pg   MCHC 33.1 30.0 - 36.0 g/dL   RDW 17.1 (H) 11.5 - 15.5 %   Platelets 295 150 - 400 K/uL   nRBC 0.0 0.0 - 0.2 %    Imaging Studies:    06/24/19 ultrasound report pending  Medications:  Scheduled . betamethasone acetate-betamethasone sodium phosphate  12 mg Intramuscular Q24H  . docusate sodium  100 mg Oral Daily  . enoxaparin (LOVENOX) injection  40 mg Subcutaneous Q24H  . influenza vac split quadrivalent PF  0.5 mL Intramuscular Tomorrow-1000  . insulin aspart  0-16 Units Subcutaneous TID PC  . labetalol  400 mg Oral Q8H  . prenatal multivitamin  1 tablet Oral Q1200   I have reviewed the patient's current medications.  ASSESSMENT: [id number] [redacted]w[redacted]d Estimated Date of Delivery: 08/19/19  Patient Active Problem List   Diagnosis Date Noted  . Preeclampsia, third trimester 06/24/2019  . Supervision of high risk pregnancy, antepartum 06/22/2019  . Chronic hypertension affecting pregnancy 06/22/2019  . Hypertension 07/20/2018  . SAB (spontaneous abortion) 07/20/2018  . Kidney disease 07/20/2018    PLAN: 33 yo with CKD, CHTN ? Superimposed preeclampsia and new diagnosis of GDM - patient with continued severe range BP. Labetalol  increased to 400 TID - New GDM with elevated CBGs. Will start insulin - Appreciate nephrology note - Follow up ultrasound report - Second  dose of steroid today - Continue antepartum care  December Hedtke 06/25/2019,10:10 AM

## 2019-06-25 NOTE — Progress Notes (Addendum)
  [facility] Progress Note    Assessment/ Plan:   33 year old female in third trimester of pregnancy presenting with uncontrolled hypertension, chronic renal insufficiency, nephrotic range proteinuria and hypoalbuminemia.  Thankfully her GFR has not changed much during this pregnancy.  She is relatively well compensated, not tremendously edematous at the current time.    1. CKD 3 with nephrotic range proteinuria, predating pregnancy, unclear etiology. Baseline Cr appears to be 1.7-1.9.  Reports a history of lupus in her half-sister.  Sending ANA, anti-SM ab, SSA/ SSB sent and pending.  HIV, hep serologies negative, RPR negative.  Complements WNL.  Discussed with pt that she would likely need a kidney biopsy after delivery to further characterize her CKD and provide directed treatment.    2. Chronic HTN with possible superimposed pre-eclampsia: uptitrating labetalol. BP improving with increased labetalol  3. Hypoalbuminemia, nephrotic range proteinuria, edema= nephrotic syndrome  4. Third trimester pregnancy,- 32wks1 day by ultrasound  5.  GDM: per primary  6.  Normal anion gap metabolic acidosis  Subjective:    Feeling OK.  Labetalol increased to 400 mg TID.  Blood pressures are getting better than when admitted.  Denies HA, blurry vision, chest pain, SOB.  Reports small amount of swelling L > R lower extremity.     Objective:   BP (!) 151/88 (BP Location: Left Arm)   Pulse 79   Temp 97.8 F (36.6 C) (Oral)   Resp 16   Ht 4\' 8"  (1.422 m)   Wt 85.2 kg   LMP  (LMP Unknown)   SpO2 95%   BMI 42.09 kg/m   Intake/Output Summary (Last 24 hours) at 06/25/2019 1318 Last data filed at 06/25/2019 1137 Gross per 24 hour  Intake 3438.06 ml  Output 3225 ml  Net 213.06 ml   Weight change:   Physical Exam: Gen:NAD, lying in bed HEENT: no JVD, sclerae anicteric CVS: RRR no m/r/g Resp: clear bilaterally no c/w/r Abd: + gravid uterus, fundal height not measured by  me Ext: trace L LE edema and RLE edema  Imaging: No results found.  Labs: BMET Recent Labs  Lab 06/24/19 1041  NA 136  K 4.3  CL 109  CO2 17*  GLUCOSE 140*  BUN 25*  CREATININE 1.95*  CALCIUM 8.5*   CBC Recent Labs  Lab 06/24/19 1041 06/25/19 0620  WBC 9.3 9.7  HGB 13.3 12.6  HCT 39.2 38.1  MCV 81.8 81.6  PLT 260 295    Medications:    . docusate sodium  100 mg Oral Daily  . enoxaparin (LOVENOX) injection  40 mg Subcutaneous Q24H  . insulin aspart  0-16 Units Subcutaneous TID PC  . insulin aspart  7 Units Subcutaneous TID WC  . [START ON 06/26/2019] insulin NPH Human  18 Units Subcutaneous QAC breakfast  . insulin NPH Human  7 Units Subcutaneous QHS  . labetalol  400 mg Oral Q8H  . prenatal multivitamin  1 tablet Oral Q1200      Madelon Lips, MD [facility] pgr [phone] 06/25/2019, 1:18 PM

## 2019-06-25 NOTE — Progress Notes (Signed)
I ordered pt meals, by Renee Porter Spanish Interpreter.

## 2019-06-26 LAB — CBC
HCT: 38.3 % (ref 36.0–46.0)
HCT: 37.9 % (ref 36.0–46.0)
Hemoglobin: 12.6 g/dL (ref 12.0–15.0)
Hemoglobin: 12.2 g/dL (ref 12.0–15.0)
MCH: 27.3 pg (ref 26.0–34.0)
MCH: 26.5 pg (ref 26.0–34.0)
MCHC: 32.9 g/dL (ref 30.0–36.0)
MCHC: 32.2 g/dL (ref 30.0–36.0)
MCV: 82.9 fL (ref 80.0–100.0)
MCV: 82.4 fL (ref 80.0–100.0)
Platelets: 285 10*3/uL (ref 150–400)
Platelets: 287 10*3/uL (ref 150–400)
RBC: 4.62 MIL/uL (ref 3.87–5.11)
RBC: 4.6 MIL/uL (ref 3.87–5.11)
RDW: 17.4 % — ABNORMAL HIGH (ref 11.5–15.5)
RDW: 17.3 % — ABNORMAL HIGH (ref 11.5–15.5)
WBC: 9.8 10*3/uL (ref 4.0–10.5)
WBC: 9.8 10*3/uL (ref 4.0–10.5)
nRBC: 0 % (ref 0.0–0.2)
nRBC: 0 % (ref 0.0–0.2)

## 2019-06-26 LAB — COMPREHENSIVE METABOLIC PANEL
ALT: 28 U/L (ref 0–44)
ALT: 28 U/L (ref 0–44)
AST: 17 U/L (ref 15–41)
AST: 21 U/L (ref 15–41)
Albumin: 2.1 g/dL — ABNORMAL LOW (ref 3.5–5.0)
Albumin: 2.1 g/dL — ABNORMAL LOW (ref 3.5–5.0)
Alkaline Phosphatase: 124 U/L (ref 38–126)
Alkaline Phosphatase: 123 U/L (ref 38–126)
Anion gap: 10 (ref 5–15)
Anion gap: 10 (ref 5–15)
BUN: 43 mg/dL — ABNORMAL HIGH (ref 6–20)
BUN: 49 mg/dL — ABNORMAL HIGH (ref 6–20)
CO2: 19 mmol/L — ABNORMAL LOW (ref 22–32)
CO2: 18 mmol/L — ABNORMAL LOW (ref 22–32)
Calcium: 9 mg/dL (ref 8.9–10.3)
Calcium: 8.9 mg/dL (ref 8.9–10.3)
Chloride: 103 mmol/L (ref 98–111)
Chloride: 104 mmol/L (ref 98–111)
Creatinine, Ser: 2.51 mg/dL — ABNORMAL HIGH (ref 0.44–1.00)
Creatinine, Ser: 2.57 mg/dL — ABNORMAL HIGH (ref 0.44–1.00)
GFR calc Af Amer: 29 mL/min — ABNORMAL LOW (ref >60)
GFR calc Af Amer: 28 mL/min — ABNORMAL LOW (ref >60)
GFR calc non Af Amer: 25 mL/min — ABNORMAL LOW (ref >60)
GFR calc non Af Amer: 24 mL/min — ABNORMAL LOW (ref >60)
Glucose, Bld: 106 mg/dL — ABNORMAL HIGH (ref 70–99)
Glucose, Bld: 99 mg/dL (ref 70–99)
Potassium: 4.6 mmol/L (ref 3.5–5.1)
Potassium: 4.6 mmol/L (ref 3.5–5.1)
Sodium: 132 mmol/L — ABNORMAL LOW (ref 135–145)
Sodium: 132 mmol/L — ABNORMAL LOW (ref 135–145)
Total Bilirubin: 0.5 mg/dL (ref 0.3–1.2)
Total Bilirubin: 0.3 mg/dL (ref 0.3–1.2)
Total Protein: 5.7 g/dL — ABNORMAL LOW (ref 6.5–8.1)
Total Protein: 5.7 g/dL — ABNORMAL LOW (ref 6.5–8.1)

## 2019-06-26 LAB — SJOGRENS SYNDROME-A EXTRACTABLE NUCLEAR ANTIBODY: SSA (Ro) (ENA) Antibody, IgG: <0.2 AI (ref 0.0–0.9)

## 2019-06-26 LAB — SJOGRENS SYNDROME-B EXTRACTABLE NUCLEAR ANTIBODY: SSB (La) (ENA) Antibody, IgG: <0.2 AI (ref 0.0–0.9)

## 2019-06-26 LAB — GLUCOSE, CAPILLARY
Glucose-Capillary: 105 mg/dL — ABNORMAL HIGH (ref 70–99)
Glucose-Capillary: 125 mg/dL — ABNORMAL HIGH (ref 70–99)
Glucose-Capillary: 134 mg/dL — ABNORMAL HIGH (ref 70–99)
Glucose-Capillary: 93 mg/dL (ref 70–99)
Glucose-Capillary: 52 mg/dL — ABNORMAL LOW (ref 70–99)
Glucose-Capillary: 107 mg/dL — ABNORMAL HIGH (ref 70–99)
Glucose-Capillary: 140 mg/dL — ABNORMAL HIGH (ref 70–99)

## 2019-06-26 LAB — MPO/PR-3 (ANCA) ANTIBODIES
ANCA Proteinase 3: <3.5 U/mL (ref 0.0–3.5)
Myeloperoxidase Abs: <9 U/mL (ref 0.0–9.0)

## 2019-06-26 MED ORDER — ENOXAPARIN SODIUM 30 MG/0.3ML ~~LOC~~ SOLN
30.0000 mg | SUBCUTANEOUS | Status: DC
  Administered 2019-06-26 – 2019-06-27 (×2): 30 mg via SUBCUTANEOUS
  Filled 2019-06-26 (×3): qty 0.3

## 2019-06-26 MED ORDER — INSULIN NPH (HUMAN) (ISOPHANE) 100 UNIT/ML ~~LOC~~ SUSP
10.0000 [IU] | Freq: Every day | SUBCUTANEOUS | Status: DC
  Administered 2019-06-26 – 2019-06-27 (×2): 10 [IU] via SUBCUTANEOUS

## 2019-06-26 MED ORDER — LABETALOL HCL 200 MG PO TABS
600.0000 mg | ORAL_TABLET | Freq: Three times a day (TID) | ORAL | Status: DC
  Administered 2019-06-26 – 2019-06-29 (×7): 600 mg via ORAL
  Filled 2019-06-26 (×8): qty 3

## 2019-06-26 MED ORDER — ENOXAPARIN SODIUM 40 MG/0.4ML ~~LOC~~ SOLN: 30.0000 mg | SUBCUTANEOUS | Status: DC

## 2019-06-26 MED ORDER — NIFEDIPINE ER OSMOTIC RELEASE 30 MG PO TB24
30.0000 mg | ORAL_TABLET | Freq: Every day | ORAL | Status: DC
  Administered 2019-06-26 – 2019-06-27 (×2): 30 mg via ORAL
  Filled 2019-06-26 (×2): qty 1

## 2019-06-26 MED ORDER — INSULIN NPH (HUMAN) (ISOPHANE) 100 UNIT/ML ~~LOC~~ SUSP
24.0000 [IU] | Freq: Every day | SUBCUTANEOUS | Status: DC
  Administered 2019-06-26 – 2019-06-27 (×2): 24 [IU] via SUBCUTANEOUS

## 2019-06-26 NOTE — Progress Notes (Signed)
   06/26/19 0246  Hourly Rounding  Assessment Alert  Intervention Call light w/in reach;Environment secured  Fetal Heart Rate A  Mode External  Baseline Rate (A) 125 bpm  Pt called out c/o of DFM. Monitor applied

## 2019-06-26 NOTE — Progress Notes (Signed)
Patient ID: Renee Porter, female   DOB: 1985/10/23, 33 y.o.   MRN: [id number] [facility] Attending Spoke to Dr Hollie Salk earlier about increase in renal function. Discussed with Dr Gaynell Face, MFM, as well Repeat labs no evidence of HELLP and BP stable with addition of Procardia by nephrology Dr Gertie Exon has also spoken to Dr Hollie Salk. From an OB standpoint will plan for delivery at 34 weeks unless otherwise indicated. OK for renal dialysis if needed per nephrology Will continue with fetal monitoring and repeat labs in AM.

## 2019-06-26 NOTE — Progress Notes (Signed)
[facility] Progress Note    Assessment/ Plan:   33 year old female in third trimester of pregnancy presenting with uncontrolled hypertension, chronic renal insufficiency, nephrotic range proteinuria and hypoalbuminemia.    1. CKD 3 with nephrotic range proteinuria, predating pregnancy, unclear etiology. Baseline Cr appears to be 1.7-1.9.  Reports a history of lupus in her half-sister.  ANA positive, anti-SM ab negative, SSA/ SSB sent and pending.  HIV, hep serologies negative, RPR negative.  Complements WNL.  Her creatinine has gone from 1.9--> 2.5 today with a BUN of 43.  UP/C 8.8.  I think she has declared herself to have likely chronic HTN with superimposed pre-eclampsia with evidence of end-organ damage.  I have discussed with Dr. Rip Harbour who will also discuss with MFM today.  I am concerned about permanent nephron loss and acceleration of AKI with continued pregnancy which could result in significant CKD or progression to ESRD.  BUN is 43, which is quite close to our cutoff of 50 in pregnancy for consideration of RRT.  I will repeat the renal function panel now.  She will eventually need a kidney biopsy after she delivers.       2. Chronic HTN with superimposed pre-eclampsia: uptitrating labetalol to 600 TID today, I have added procardia XL 30.  Pressures not at goal but are improving  3.  Third trimester pregnancy: 32 weeks by [place] 06/24/2019.  S/p 2 doses BMZ  4.  Dispo: remains inpt.  I appreciate MFM and OB colleagues with coordination of care.    Subjective:    Labs reviewed from this AM.  Cr is up to 2.5, BUN 43.     Objective:   BP (!) 159/100 (BP Location: Left Arm)   Pulse 82   Temp 98.3 F (36.8 C) (Oral)   Resp 18   Ht 4\' 8"  (1.422 m)   Wt 85.2 kg   LMP  (LMP Unknown)   SpO2 97%   BMI 42.09 kg/m   Intake/Output Summary (Last 24 hours) at 06/26/2019 1219 Last data filed at 06/26/2019 0900 Gross per 24 hour  Intake 2280 ml  Output 1750 ml  Net 530 ml    Weight change:   Physical Exam: Gen:NAD, lying in bed HEENT: no JVD, sclerae anicteric CVS: RRR no m/r/g Resp: clear bilaterally no c/w/r Abd: + gravid uterus, fundal height not measured by me Ext: trace L LE edema and RLE edema  Imaging: No results found.  Labs: BMET Recent Labs  Lab 06/24/19 1041 06/26/19 0604  NA 136 132*  K 4.3 4.6  CL 109 103  CO2 17* 19*  GLUCOSE 140* 106*  BUN 25* 43*  CREATININE 1.95* 2.51*  CALCIUM 8.5* 9.0   CBC Recent Labs  Lab 06/24/19 1041 06/25/19 0620 06/26/19 0604  WBC 9.3 9.7 9.8  HGB 13.3 12.6 12.6  HCT 39.2 38.1 38.3  MCV 81.8 81.6 82.9  PLT 260 295 285    Medications:    . docusate sodium  100 mg Oral Daily  . enoxaparin (LOVENOX) injection  30 mg Subcutaneous Q24H  . insulin aspart  0-16 Units Subcutaneous TID PC  . insulin aspart  7 Units Subcutaneous TID WC  . insulin NPH Human  10 Units Subcutaneous QHS  . insulin NPH Human  24 Units Subcutaneous QAC breakfast  . labetalol  600 mg Oral Q8H  . NIFEdipine  30 mg Oral Daily  . prenatal multivitamin  1 tablet Oral Q1200      Madelon Lips, MD  [facility] pgr [phone] 06/26/2019, 12:19 PM

## 2019-06-26 NOTE — Progress Notes (Signed)
Inpatient Diabetes Program Recommendations  AACE/ADA: New Consensus Statement on Inpatient Glycemic Control (2015)  Target Ranges:  Prepandial:   less than 140 mg/dL      Peak postprandial:   less than 180 mg/dL (1-2 hours)      Critically ill patients:  140 - 180 mg/dL   Lab Results  Component Value Date   GLUCAP 105 (H) 06/26/2019   HGBA1C 6.1 (H) 06/24/2019    Review of Glycemic Control Results for CYLAH, FRIEDT (MRN [id number]) as of 06/26/2019 08:42  Ref. Range 06/25/2019 16:32 06/25/2019 19:57 06/26/2019 06:18  Glucose-Capillary Latest Ref Range: 70 - 99 mg/dL 188 (H) 185 (H) 105 (H)   Diabetes history: Newly diagnosed GDM Diabetes medications: none Current orders for Inpatient glycemic control: NPH 10 units QHS, 24 units QAM, Novolog 7 units TID, Novolog 0-16 units TID (after meals) BMZ x 2  Inpatient Diabetes Program Recommendations:    Noted consult. Reviewed GTT results and current glucose trends. In agreement with management.    Reviewed patient's current A1c of 6.1%. Explained what a A1c is and what it measures as well as the considerations for prgnancy. Also reviewed goal A1c with patient, importance of good glucose control @ home, and blood sugar goals. Reviewed patho of DM, GDM, need for insulin, role of pancreas, increased insulin resistance in pregnancy, consideration of long term observation given CKD, role of insulin, inpatient trends, risk of developing T2DM, vascular changes, comorbidities. Patient will need a meter at discharge. Encouraged to begin learning how to check CBGs while inpatient. Reviewed frequency for pregnancy. Discussed Relion products and have attached information to DC summary. Additionally, will place CM consult to ensure patient will have PCP care following pregnancy. Information attached, also encouraged patient to make appointment.  Patient admits to drinking juices daily. Reviewed the importance of being mindful to carb intake and reviewed  alternatives. Patient has no further questions at this time.  Thanks, Bronson Curb, MSN, RNC-OB Diabetes Coordinator [phone] (8a-5p)

## 2019-06-26 NOTE — Progress Notes (Signed)
S. No problems, she reports decreased fm, denies BV, ROM, or contractions.  O. VSS, AF FHR- reassuring Abd- benign FBS this morning 105 2hour PCs yesterday starting after lunch: 146, 188, and 185 U/s- normal growth and VTX lie, ? Right foot clubbing (followup u/s in 3-4 weeks per MFM  A/P. 32.2 days, HD #2 for CHTN, GDM, CKD, and ? Superimposed Pre e-  Sugars responding to insulin- I will increase her NPH BID  BPs still elevated on labetalol 400 mg TID I will increase this to 600 mg TID.  S/p BMZ x 2

## 2019-06-27 ENCOUNTER — Inpatient Hospital Stay (HOSPITAL_COMMUNITY): Payer: Medicaid Other

## 2019-06-27 DIAGNOSIS — O10913 Unspecified pre-existing hypertension complicating pregnancy, third trimester: Secondary | ICD-10-CM | POA: Diagnosis not present

## 2019-06-27 DIAGNOSIS — O1493 Unspecified pre-eclampsia, third trimester: Secondary | ICD-10-CM | POA: Diagnosis not present

## 2019-06-27 DIAGNOSIS — N189 Chronic kidney disease, unspecified: Secondary | ICD-10-CM | POA: Diagnosis not present

## 2019-06-27 DIAGNOSIS — N183 Chronic kidney disease, stage 3 (moderate): Secondary | ICD-10-CM | POA: Diagnosis not present

## 2019-06-27 LAB — DIFFERENTIAL
Abs Immature Granulocytes: 0.06 10*3/uL (ref 0.00–0.07)
Basophils Absolute: 0 10*3/uL (ref 0.0–0.1)
Basophils Relative: 0 %
Eosinophils Absolute: 0 10*3/uL (ref 0.0–0.5)
Eosinophils Relative: 0 %
Immature Granulocytes: 1 %
Lymphocytes Relative: 9 %
Lymphs Abs: 0.6 10*3/uL — ABNORMAL LOW (ref 0.7–4.0)
Monocytes Absolute: 0.7 10*3/uL (ref 0.1–1.0)
Monocytes Relative: 10 %
Neutro Abs: 5.8 10*3/uL (ref 1.7–7.7)
Neutrophils Relative %: 80 %

## 2019-06-27 LAB — COMPREHENSIVE METABOLIC PANEL
ALT: 38 U/L (ref 0–44)
AST: 33 U/L (ref 15–41)
Albumin: 2.2 g/dL — ABNORMAL LOW (ref 3.5–5.0)
Alkaline Phosphatase: 115 U/L (ref 38–126)
Anion gap: 13 (ref 5–15)
BUN: 54 mg/dL — ABNORMAL HIGH (ref 6–20)
CO2: 17 mmol/L — ABNORMAL LOW (ref 22–32)
Calcium: 8.6 mg/dL — ABNORMAL LOW (ref 8.9–10.3)
Chloride: 104 mmol/L (ref 98–111)
Creatinine, Ser: 2.57 mg/dL — ABNORMAL HIGH (ref 0.44–1.00)
GFR calc Af Amer: 28 mL/min — ABNORMAL LOW (ref >60)
GFR calc non Af Amer: 24 mL/min — ABNORMAL LOW (ref >60)
Glucose, Bld: 106 mg/dL — ABNORMAL HIGH (ref 70–99)
Potassium: 4.8 mmol/L (ref 3.5–5.1)
Sodium: 134 mmol/L — ABNORMAL LOW (ref 135–145)
Total Bilirubin: 0.6 mg/dL (ref 0.3–1.2)
Total Protein: 5.7 g/dL — ABNORMAL LOW (ref 6.5–8.1)

## 2019-06-27 LAB — CBC
HCT: 40.7 % (ref 36.0–46.0)
Hemoglobin: 13.4 g/dL (ref 12.0–15.0)
MCH: 27.5 pg (ref 26.0–34.0)
MCHC: 32.9 g/dL (ref 30.0–36.0)
MCV: 83.4 fL (ref 80.0–100.0)
Platelets: 259 10*3/uL (ref 150–400)
RBC: 4.88 MIL/uL (ref 3.87–5.11)
RDW: 17.4 % — ABNORMAL HIGH (ref 11.5–15.5)
WBC: 7.5 10*3/uL (ref 4.0–10.5)
nRBC: 0 % (ref 0.0–0.2)

## 2019-06-27 LAB — GLUCOSE, CAPILLARY
Glucose-Capillary: 96 mg/dL (ref 70–99)
Glucose-Capillary: 113 mg/dL — ABNORMAL HIGH (ref 70–99)
Glucose-Capillary: 118 mg/dL — ABNORMAL HIGH (ref 70–99)
Glucose-Capillary: 69 mg/dL — ABNORMAL LOW (ref 70–99)
Glucose-Capillary: 83 mg/dL (ref 70–99)
Glucose-Capillary: 47 mg/dL — ABNORMAL LOW (ref 70–99)
Glucose-Capillary: 76 mg/dL (ref 70–99)
Glucose-Capillary: 78 mg/dL (ref 70–99)
Glucose-Capillary: 131 mg/dL — ABNORMAL HIGH (ref 70–99)

## 2019-06-27 LAB — TECHNOLOGIST SMEAR REVIEW

## 2019-06-27 LAB — PROTEIN / CREATININE RATIO, URINE
Creatinine, Urine: 62.56 mg/dL
Protein Creatinine Ratio: 2 mg/mg{Cre} — ABNORMAL HIGH (ref 0.00–0.15)
Total Protein, Urine: 125 mg/dL

## 2019-06-27 MED ORDER — INSULIN NPH (HUMAN) (ISOPHANE) 100 UNIT/ML ~~LOC~~ SUSP
20.0000 [IU] | Freq: Every day | SUBCUTANEOUS | Status: DC
  Administered 2019-06-28: 08:00:00 20 [IU] via SUBCUTANEOUS

## 2019-06-27 MED ORDER — NIFEDIPINE ER OSMOTIC RELEASE 60 MG PO TB24
60.0000 mg | ORAL_TABLET | Freq: Every day | ORAL | Status: DC
  Administered 2019-06-28: 60 mg via ORAL
  Filled 2019-06-27: qty 1
  Filled 2019-06-27: qty 2

## 2019-06-27 MED ORDER — NIFEDIPINE ER OSMOTIC RELEASE 30 MG PO TB24
30.0000 mg | ORAL_TABLET | Freq: Once | ORAL | Status: AC
  Administered 2019-06-27: 30 mg via ORAL
  Filled 2019-06-27: qty 1

## 2019-06-27 NOTE — Progress Notes (Addendum)
[facility] Progress Note    Assessment/ Plan:   33 year old female in third trimester of pregnancy presenting with uncontrolled hypertension, chronic renal insufficiency, nephrotic range proteinuria and hypoalbuminemia.    1. CKD 3 with nephrotic range proteinuria, predating pregnancy, unclear etiology: Baseline Cr appears to be 1.7-1.9.  Reports a history of lupus in her half-sister.  UA bland without RBCs or WBCs.  ANA positive, anti-SM ab negative, SSA/ SSB negative, ENA negative.  Complements WNL.  HIV, hep serologies negative, RPR negative.  Her Cr has risen from 1.9--> 2.5 and blood pressures are still in the severe range.  Her BUN has steadily increased from 25 --> 43--> 49--> 54 this AM 9/20.  Repeat UP/C this AM 2 g, down from 8.8g on admission--> will start 24 hr protein for accurate quantification.  It appears she has AKI in pregnancy (etiology as yet undetermined; does not appear to be GN related) which could result in permanent advancement of CKD and/or progression to ESRD.  BUN 54 and may need to consider RRT for the remainder of the pregnancy especially if it continues to uptrend; BUN of 50-60 contribute to preterm labor and poor fetal outcomes.  Discussed this via interpreter with the pt today.  It does not appear she has superimposed HELLP syndrome, TTP/atypical HUS.  She will need a renal biopsy after delivery; risks of biopsy while in 3rd trimester outweigh benefits.           2. Chronic HTN with likely superimposed pre-eclampsia: BP still not at goal. uptitrated labetalol to 600 TID 9/19, I have added procardia XL 30 9/19, increase to 60 mg today.    3.  Third trimester pregnancy: 32 weeks by [place] 06/24/2019.  S/p 2 doses BMZ  4.  Gestational DM: on insulin  5.  Dispo: remains inpt.  I appreciate MFM and OB colleagues with coordination of care.    Subjective:    Cr up to 2.57, BUN 54.  Pt denies HA, SOB, CP.  Good appetite.  BP are still not at goal, Procardia  added yesterday, Labetalol increased today.     Objective:   BP 126/80 (BP Location: Left Arm)   Pulse 77   Temp 97.6 F (36.4 C) (Oral)   Resp 18   Ht 4\' 8"  (1.422 m)   Wt 83.5 kg   LMP  (LMP Unknown)   SpO2 98%   BMI 41.25 kg/m   Intake/Output Summary (Last 24 hours) at 06/27/2019 1133 Last data filed at 06/27/2019 1054 Gross per 24 hour  Intake 1160 ml  Output 3600 ml  Net -2440 ml   Weight change:   Physical Exam: Gen:NAD, sitting in bed, lunch tray near HEENT: no JVD, sclerae anicteric CVS: RRR no m/r/g Resp: clear bilaterally no c/w/r Abd: + gravid uterus, fundal height not measured by me Ext: trace L LE edema and RLE edema  Imaging: No results found.  Labs: BMET Recent Labs  Lab 06/24/19 1041 06/26/19 0604 06/26/19 1345 06/27/19 0807  NA 136 132* 132* 134*  K 4.3 4.6 4.6 4.8  CL 109 103 104 104  CO2 17* 19* 18* 17*  GLUCOSE 140* 106* 99 106*  BUN 25* 43* 49* 54*  CREATININE 1.95* 2.51* 2.57* 2.57*  CALCIUM 8.5* 9.0 8.9 8.6*   CBC Recent Labs  Lab 06/25/19 0620 06/26/19 0604 06/26/19 1345 06/27/19 0604  WBC 9.7 9.8 9.8 7.5  HGB 12.6 12.6 12.2 13.4  HCT 38.1 38.3 37.9 40.7  MCV 81.6 82.9  82.4 83.4  PLT 295 285 287 259    Medications:    . docusate sodium  100 mg Oral Daily  . enoxaparin (LOVENOX) injection  30 mg Subcutaneous Q24H  . insulin aspart  0-16 Units Subcutaneous TID PC  . insulin aspart  7 Units Subcutaneous TID WC  . insulin NPH Human  10 Units Subcutaneous QHS  . insulin NPH Human  24 Units Subcutaneous QAC breakfast  . labetalol  600 mg Oral Q8H  . NIFEdipine  30 mg Oral Daily  . prenatal multivitamin  1 tablet Oral Q1200      Madelon Lips, MD [facility] pgr [phone] 06/27/2019, 11:33 AM

## 2019-06-27 NOTE — Progress Notes (Signed)
Patient ID: Renee Porter, female   DOB: 10-14-1985, 33 y.o.   MRN: [id number] [facility]) NOTE  Renee Porter is a 33 y.o. [id number] with Estimated Date of Delivery: 08/19/19   By  06/24/19 ultrasound at [redacted]w[redacted]d  who is admitted for [facility], CKD and ? Superimposed preeclampsia.    Fetal presentation is unsure. Length of Stay:  3  Days  Date of admission:06/24/2019  Subjective: Patient reports feeling well. She denies HA, visual changes, RUQ/epigastric pain, nausea or emesis Patient reports the fetal movement as active. Patient reports uterine contraction  activity as none. Patient reports  vaginal bleeding as none. Patient describes fluid per vagina as None.  Vitals:  Blood pressure 126/80, pulse 77, temperature 97.6 F (36.4 C), temperature source Oral, resp. rate 18, height 4\' 8"  (1.422 m), weight 83.5 kg, SpO2 98 %, unknown if currently breastfeeding. Vitals:   06/27/19 0616 06/27/19 0630 06/27/19 0738 06/27/19 0759  BP: (!) 163/94 130/81 126/80   Pulse:  76 77   Resp:   18   Temp:  98.6 F (37 C) 97.6 F (36.4 C)   TempSrc:  Oral Oral   SpO2:   98%   Weight:    83.5 kg  Height:       Physical Examination:  GENERAL: Well-developed, well-nourished female in no acute distress.  LUNGS: Clear to auscultation bilaterally.  HEART: Regular rate and rhythm. ABDOMEN: Soft, nontender, gravid PELVIC: Not indicated EXTREMITIES: No cyanosis, clubbing, or edema, 2+ distal pulses.   Fetal Monitoring:  Baseline 135, min variability, +accels, no decels     Labs:  Results for orders placed or performed during the hospital encounter of 06/24/19 (from the past 24 hour(s))  Glucose, capillary   Collection Time: 06/26/19 12:30 PM  Result Value Ref Range   Glucose-Capillary 134 (H) 70 - 99 mg/dL  CBC   Collection Time: 06/26/19  1:45 PM  Result Value Ref Range   WBC 9.8 4.0 - 10.5 K/uL   RBC 4.60 3.87 - 5.11 MIL/uL   Hemoglobin 12.2 12.0 - 15.0 g/dL   HCT 37.9 36.0 - 46.0 %   MCV 82.4 80.0 - 100.0 fL   MCH 26.5 26.0 - 34.0 pg   MCHC 32.2 30.0 - 36.0 g/dL   RDW 17.3 (H) 11.5 - 15.5 %   Platelets 287 150 - 400 K/uL   nRBC 0.0 0.0 - 0.2 %  Comprehensive metabolic panel   Collection Time: 06/26/19  1:45 PM  Result Value Ref Range   Sodium 132 (L) 135 - 145 mmol/L   Potassium 4.6 3.5 - 5.1 mmol/L   Chloride 104 98 - 111 mmol/L   CO2 18 (L) 22 - 32 mmol/L   Glucose, Bld 99 70 - 99 mg/dL   BUN 49 (H) 6 - 20 mg/dL   Creatinine, Ser 2.57 (H) 0.44 - 1.00 mg/dL   Calcium 8.9 8.9 - 10.3 mg/dL   Total Protein 5.7 (L) 6.5 - 8.1 g/dL   Albumin 2.1 (L) 3.5 - 5.0 g/dL   AST 21 15 - 41 U/L   ALT 28 0 - 44 U/L   Alkaline Phosphatase 123 38 - 126 U/L   Total Bilirubin 0.3 0.3 - 1.2 mg/dL   GFR calc non Af Amer 24 (L) >60 mL/min   GFR calc Af Amer 28 (L) >60 mL/min   Anion gap 10 5 - 15  Glucose, capillary   Collection Time: 06/26/19  2:00 PM  Result Value Ref Range   Glucose-Capillary  93 70 - 99 mg/dL  Glucose, capillary   Collection Time: 06/26/19  6:28 PM  Result Value Ref Range   Glucose-Capillary 52 (L) 70 - 99 mg/dL  Glucose, capillary   Collection Time: 06/26/19  6:59 PM  Result Value Ref Range   Glucose-Capillary 107 (H) 70 - 99 mg/dL  Glucose, capillary   Collection Time: 06/26/19  8:36 PM  Result Value Ref Range   Glucose-Capillary 140 (H) 70 - 99 mg/dL  CBC   Collection Time: 06/27/19  6:04 AM  Result Value Ref Range   WBC 7.5 4.0 - 10.5 K/uL   RBC 4.88 3.87 - 5.11 MIL/uL   Hemoglobin 13.4 12.0 - 15.0 g/dL   HCT 40.7 36.0 - 46.0 %   MCV 83.4 80.0 - 100.0 fL   MCH 27.5 26.0 - 34.0 pg   MCHC 32.9 30.0 - 36.0 g/dL   RDW 17.4 (H) 11.5 - 15.5 %   Platelets 259 150 - 400 K/uL   nRBC 0.0 0.0 - 0.2 %  Glucose, capillary   Collection Time: 06/27/19  7:34 AM  Result Value Ref Range   Glucose-Capillary 96 70 - 99 mg/dL   Comment 1 Notify RN    Comment 2 Document in Chart   Comprehensive metabolic panel   Collection Time:  06/27/19  8:07 AM  Result Value Ref Range   Sodium 134 (L) 135 - 145 mmol/L   Potassium 4.8 3.5 - 5.1 mmol/L   Chloride 104 98 - 111 mmol/L   CO2 17 (L) 22 - 32 mmol/L   Glucose, Bld 106 (H) 70 - 99 mg/dL   BUN 54 (H) 6 - 20 mg/dL   Creatinine, Ser 2.57 (H) 0.44 - 1.00 mg/dL   Calcium 8.6 (L) 8.9 - 10.3 mg/dL   Total Protein 5.7 (L) 6.5 - 8.1 g/dL   Albumin 2.2 (L) 3.5 - 5.0 g/dL   AST 33 15 - 41 U/L   ALT 38 0 - 44 U/L   Alkaline Phosphatase 115 38 - 126 U/L   Total Bilirubin 0.6 0.3 - 1.2 mg/dL   GFR calc non Af Amer 24 (L) >60 mL/min   GFR calc Af Amer 28 (L) >60 mL/min   Anion gap 13 5 - 15    Imaging Studies:    [place] Mfm Ob Detail +14 Wk  Result Date: 06/25/2019 ----------------------------------------------------------------------  OBSTETRICS REPORT                       (Signed Final 06/25/2019 02:46 pm) ---------------------------------------------------------------------- Patient Info  ID #:       [id number]                          D.O.B.:  1985/11/14 (32 yrs)  Name:       Renee Porter              Visit Date: 06/24/2019 11:14 am ---------------------------------------------------------------------- Performed By  Performed By:     Berlinda Last          Ref. Address:      [address]  [place] [address]  Attending:        Sander Nephew      Secondary Phy.:    MAU Nursing-                    MD                                                              MAU/Triage  Referred By:      Renetta Chalk          [facility]                    [place] ---------------------------------------------------------------------- Orders   #  Description                          Code         Ordered By   1  [place] MFM OB DETAIL +14 WK              [id number]     Maye Hides   ----------------------------------------------------------------------   #  Order #                    Accession #                 Episode #   1  [id number]                  [id number]                  [id number]  ---------------------------------------------------------------------- Indications   Pre-eclampsia                                  O14.90   [redacted] weeks gestation of pregnancy                Z3A.32   Insufficient Prenatal Care                     O09.30   Previous cesarean delivery, antepartum         O34.219   Encounter for antenatal screening for          Z36.3   malformations  ---------------------------------------------------------------------- Fetal Evaluation  Num Of Fetuses:          1  Fetal Heart Rate(bpm):   149  Cardiac Activity:        Observed  Presentation:            Cephalic  Placenta:  Posterior  P. Cord Insertion:       Visualized  Amniotic Fluid  AFI FV:      Within normal limits  AFI Sum(cm)     %Tile       Largest Pocket(cm)  16.3            59          5.7  RUQ(cm)       RLQ(cm)       LUQ(cm)        LLQ(cm)  5.7           4.9           3.7            2 ---------------------------------------------------------------------- Biometry  BPD:      83.3  mm     G. Age:  33w 4d         83  %    CI:        76.74   %    70 - 86                                                          FL/HC:       18.8  %    19.1 - 21.3  HC:      301.2  mm     G. Age:  33w 3d         57  %    HC/AC:       1.11       0.96 - 1.17  AC:      270.3  mm     G. Age:  31w 1d         23  %    FL/BPD:      67.8  %    71 - 87  FL:       56.5  mm     G. Age:  29w 5d          2  %    FL/AC:       20.9  %    20 - 24  HUM:      50.6  mm     G. Age:  29w 5d        < 5  %  Est. FW:    1696   gm   3 lb 12 oz      15  % ---------------------------------------------------------------------- OB History  Gravidity:    4         Term:   2        Prem:   0        SAB:   1  TOP:          0       Ectopic:  0        Living: 2  ---------------------------------------------------------------------- Gestational Age  LMP:           26w 2d        Date:  12/22/18                 EDD:   09/28/19  U/S Today:     32w 0d  EDD:   08/19/19  Best:          Milderd Meager 0d     Det. By:  U/S (06/24/19)           EDD:   08/19/19 ---------------------------------------------------------------------- Anatomy  Cranium:               Appears normal         Aortic Arch:            Not well visualized  Cavum:                 Appears normal         Ductal Arch:            Appears normal  Ventricles:            Appears normal         Diaphragm:              Appears normal  Choroid Plexus:        Appears normal         Stomach:                Appears normal, left                                                                        sided  Cerebellum:            Appears normal         Abdomen:                Appears normal  Posterior Fossa:       Appears normal         Abdominal Wall:         Not well visualized  Nuchal Fold:           Not applicable ([id number]    Cord Vessels:           Appears normal ([redacted]                         wks GA)                                        vessel cord)  Face:                  Profile nl; orbits not Kidneys:                Appear normal                         well visualized  Lips:                  Appears normal         Bladder:                Appears normal  Thoracic:              Appears normal         Spine:  Appears normal  Heart:                 Not well visualized    Upper Extremities:      Visualized  RVOT:                  Not well visualized    Lower Extremities:      Right Clubfoot  LVOT:                  Appears normal  Other:  Nasal bone visualized. Hands not well visualized. left heel visualized.          Technically difficult due to advanced GA and fetal position. ---------------------------------------------------------------------- Cervix Uterus Adnexa  Cervix  Not  visualized (advanced GA >24wks) ---------------------------------------------------------------------- Impression  Normal interval growth.  No ultrasonic evidence of structural  fetal anomalies.  Suboptimal views of the fetal anatomy was obtained  secondary to fetal position.  The right foot appears clubbed in some images. This finding  was isolated. There was good movement of this extremity ---------------------------------------------------------------------- Recommendations  Follow up growth in 3-4 weeks and to assess clubbed foot. ----------------------------------------------------------------------               Sander Nephew, MD Electronically Signed Final Report   06/25/2019 02:46 pm ----------------------------------------------------------------------    Medications:  Scheduled . docusate sodium  100 mg Oral Daily  . enoxaparin (LOVENOX) injection  30 mg Subcutaneous Q24H  . insulin aspart  0-16 Units Subcutaneous TID PC  . insulin aspart  7 Units Subcutaneous TID WC  . insulin NPH Human  10 Units Subcutaneous QHS  . insulin NPH Human  24 Units Subcutaneous QAC breakfast  . labetalol  600 mg Oral Q8H  . NIFEdipine  30 mg Oral Daily  . prenatal multivitamin  1 tablet Oral Q1200   I have reviewed the patient's current medications.  ASSESSMENT: [id number] [redacted]w[redacted]d Estimated Date of Delivery: 08/19/19  Patient Active Problem List   Diagnosis Date Noted  . Preeclampsia, third trimester 06/24/2019  . Supervision of high risk pregnancy, antepartum 06/22/2019  . Chronic hypertension affecting pregnancy 06/22/2019  . Hypertension 07/20/2018  . SAB (spontaneous abortion) 07/20/2018  . Kidney disease 07/20/2018    PLAN: 32 yo with CKD, CHTN ? Superimposed preeclampsia and new diagnosis of GDM - patient with continued severe range BP. Labetalol increased to 800 TID. Continue procardia 30 daily - New GDM with elevated CBGs- CBGs relatively well controlled - Cr stable this morning -  Patient completed magnesium sulfate and BMZ. Plan for delivery at 34 weeks - Patient with a previous cesarean section followed by TOLAC. Patient is undecided at this time regarding mode of delivery - Continue antepartum care  Sayf Kerner 06/27/2019,9:47 AM

## 2019-06-27 NOTE — Progress Notes (Signed)
24 hour urine started.

## 2019-06-28 ENCOUNTER — Inpatient Hospital Stay (HOSPITAL_COMMUNITY): Payer: Medicaid Other | Admitting: Anesthesiology

## 2019-06-28 DIAGNOSIS — O10913 Unspecified pre-existing hypertension complicating pregnancy, third trimester: Secondary | ICD-10-CM | POA: Diagnosis not present

## 2019-06-28 DIAGNOSIS — N183 Chronic kidney disease, stage 3 (moderate): Secondary | ICD-10-CM | POA: Diagnosis not present

## 2019-06-28 LAB — PROTEIN, URINE, 24 HOUR
Collection Interval-UPROT: 24 hours
Protein, 24H Urine: 2266 mg/d — ABNORMAL HIGH (ref 50–100)
Protein, Urine: 103 mg/dL
Urine Total Volume-UPROT: 2200 mL

## 2019-06-28 LAB — CBC
HCT: 37.4 % (ref 36.0–46.0)
HCT: 40.8 % (ref 36.0–46.0)
Hemoglobin: 12 g/dL (ref 12.0–15.0)
Hemoglobin: 13.5 g/dL (ref 12.0–15.0)
MCH: 27 pg (ref 26.0–34.0)
MCH: 27.7 pg (ref 26.0–34.0)
MCHC: 32.1 g/dL (ref 30.0–36.0)
MCHC: 33.1 g/dL (ref 30.0–36.0)
MCV: 84.2 fL (ref 80.0–100.0)
MCV: 83.6 fL (ref 80.0–100.0)
Platelets: 251 10*3/uL (ref 150–400)
Platelets: 257 10*3/uL (ref 150–400)
RBC: 4.44 MIL/uL (ref 3.87–5.11)
RBC: 4.88 MIL/uL (ref 3.87–5.11)
RDW: 17.3 % — ABNORMAL HIGH (ref 11.5–15.5)
RDW: 17.3 % — ABNORMAL HIGH (ref 11.5–15.5)
WBC: 7 10*3/uL (ref 4.0–10.5)
WBC: 7.3 10*3/uL (ref 4.0–10.5)
nRBC: 0 % (ref 0.0–0.2)
nRBC: 0 % (ref 0.0–0.2)

## 2019-06-28 LAB — COMPREHENSIVE METABOLIC PANEL
ALT: 57 U/L — ABNORMAL HIGH (ref 0–44)
ALT: 55 U/L — ABNORMAL HIGH (ref 0–44)
ALT: 58 U/L — ABNORMAL HIGH (ref 0–44)
AST: 50 U/L — ABNORMAL HIGH (ref 15–41)
AST: 44 U/L — ABNORMAL HIGH (ref 15–41)
AST: 42 U/L — ABNORMAL HIGH (ref 15–41)
Albumin: 2.2 g/dL — ABNORMAL LOW (ref 3.5–5.0)
Albumin: 2.2 g/dL — ABNORMAL LOW (ref 3.5–5.0)
Albumin: 2.3 g/dL — ABNORMAL LOW (ref 3.5–5.0)
Alkaline Phosphatase: 122 U/L (ref 38–126)
Alkaline Phosphatase: 117 U/L (ref 38–126)
Alkaline Phosphatase: 126 U/L (ref 38–126)
Anion gap: 10 (ref 5–15)
Anion gap: 9 (ref 5–15)
Anion gap: 9 (ref 5–15)
BUN: 58 mg/dL — ABNORMAL HIGH (ref 6–20)
BUN: 55 mg/dL — ABNORMAL HIGH (ref 6–20)
BUN: 50 mg/dL — ABNORMAL HIGH (ref 6–20)
CO2: 19 mmol/L — ABNORMAL LOW (ref 22–32)
CO2: 19 mmol/L — ABNORMAL LOW (ref 22–32)
CO2: 20 mmol/L — ABNORMAL LOW (ref 22–32)
Calcium: 8.6 mg/dL — ABNORMAL LOW (ref 8.9–10.3)
Calcium: 8.5 mg/dL — ABNORMAL LOW (ref 8.9–10.3)
Calcium: 8.6 mg/dL — ABNORMAL LOW (ref 8.9–10.3)
Chloride: 106 mmol/L (ref 98–111)
Chloride: 106 mmol/L (ref 98–111)
Chloride: 105 mmol/L (ref 98–111)
Creatinine, Ser: 2.67 mg/dL — ABNORMAL HIGH (ref 0.44–1.00)
Creatinine, Ser: 2.51 mg/dL — ABNORMAL HIGH (ref 0.44–1.00)
Creatinine, Ser: 2.39 mg/dL — ABNORMAL HIGH (ref 0.44–1.00)
GFR calc Af Amer: 27 mL/min — ABNORMAL LOW (ref >60)
GFR calc Af Amer: 29 mL/min — ABNORMAL LOW (ref >60)
GFR calc Af Amer: 31 mL/min — ABNORMAL LOW (ref >60)
GFR calc non Af Amer: 23 mL/min — ABNORMAL LOW (ref >60)
GFR calc non Af Amer: 25 mL/min — ABNORMAL LOW (ref >60)
GFR calc non Af Amer: 26 mL/min — ABNORMAL LOW (ref >60)
Glucose, Bld: 73 mg/dL (ref 70–99)
Glucose, Bld: 118 mg/dL — ABNORMAL HIGH (ref 70–99)
Glucose, Bld: 74 mg/dL (ref 70–99)
Potassium: 4.8 mmol/L (ref 3.5–5.1)
Potassium: 4.7 mmol/L (ref 3.5–5.1)
Potassium: 4.5 mmol/L (ref 3.5–5.1)
Sodium: 135 mmol/L (ref 135–145)
Sodium: 134 mmol/L — ABNORMAL LOW (ref 135–145)
Sodium: 134 mmol/L — ABNORMAL LOW (ref 135–145)
Total Bilirubin: <0.1 mg/dL — ABNORMAL LOW (ref 0.3–1.2)
Total Bilirubin: 0.3 mg/dL (ref 0.3–1.2)
Total Bilirubin: 0.3 mg/dL (ref 0.3–1.2)
Total Protein: 5.7 g/dL — ABNORMAL LOW (ref 6.5–8.1)
Total Protein: 5.5 g/dL — ABNORMAL LOW (ref 6.5–8.1)
Total Protein: 6.1 g/dL — ABNORMAL LOW (ref 6.5–8.1)

## 2019-06-28 LAB — CBC WITH DIFFERENTIAL/PLATELET
Abs Immature Granulocytes: 0.06 10*3/uL (ref 0.00–0.07)
Basophils Absolute: 0 10*3/uL (ref 0.0–0.1)
Basophils Relative: 0 %
Eosinophils Absolute: 0 10*3/uL (ref 0.0–0.5)
Eosinophils Relative: 0 %
HCT: 39.9 % (ref 36.0–46.0)
Hemoglobin: 12.7 g/dL (ref 12.0–15.0)
Immature Granulocytes: 1 %
Lymphocytes Relative: 14 %
Lymphs Abs: 1 10*3/uL (ref 0.7–4.0)
MCH: 26.9 pg (ref 26.0–34.0)
MCHC: 31.8 g/dL (ref 30.0–36.0)
MCV: 84.5 fL (ref 80.0–100.0)
Monocytes Absolute: 0.8 10*3/uL (ref 0.1–1.0)
Monocytes Relative: 11 %
Neutro Abs: 5.4 10*3/uL (ref 1.7–7.7)
Neutrophils Relative %: 74 %
Platelets: 259 10*3/uL (ref 150–400)
RBC: 4.72 MIL/uL (ref 3.87–5.11)
RDW: 17.4 % — ABNORMAL HIGH (ref 11.5–15.5)
WBC: 7.2 10*3/uL (ref 4.0–10.5)
nRBC: 0 % (ref 0.0–0.2)

## 2019-06-28 LAB — GLUCOSE, CAPILLARY
Glucose-Capillary: 67 mg/dL — ABNORMAL LOW (ref 70–99)
Glucose-Capillary: 103 mg/dL — ABNORMAL HIGH (ref 70–99)
Glucose-Capillary: 40 mg/dL — CL (ref 70–99)
Glucose-Capillary: 88 mg/dL (ref 70–99)
Glucose-Capillary: 131 mg/dL — ABNORMAL HIGH (ref 70–99)
Glucose-Capillary: 41 mg/dL — CL (ref 70–99)
Glucose-Capillary: 126 mg/dL — ABNORMAL HIGH (ref 70–99)
Glucose-Capillary: 86 mg/dL (ref 70–99)
Glucose-Capillary: 131 mg/dL — ABNORMAL HIGH (ref 70–99)
Glucose-Capillary: 111 mg/dL — ABNORMAL HIGH (ref 70–99)

## 2019-06-28 LAB — TYPE AND SCREEN
ABO/RH(D): O POS
Antibody Screen: NEGATIVE

## 2019-06-28 LAB — MAGNESIUM
Magnesium: 3 mg/dL — ABNORMAL HIGH (ref 1.7–2.4)
Magnesium: 5.1 mg/dL — ABNORMAL HIGH (ref 1.7–2.4)

## 2019-06-28 MED ORDER — MAGNESIUM SULFATE 40 G IN LACTATED RINGERS - SIMPLE
1.0000 g/h | INTRAVENOUS | Status: DC
  Administered 2019-06-28: 1 g/h via INTRAVENOUS
  Filled 2019-06-28: qty 500

## 2019-06-28 MED ORDER — OXYTOCIN 40 UNITS IN NORMAL SALINE INFUSION - SIMPLE MED
1.0000 m[IU]/min | INTRAVENOUS | Status: DC
  Administered 2019-06-28: 16:00:00 4 m[IU]/min via INTRAVENOUS
  Administered 2019-06-28: 12:00:00 2 m[IU]/min via INTRAVENOUS
  Filled 2019-06-28 (×2): qty 1000

## 2019-06-28 MED ORDER — SOD CITRATE-CITRIC ACID 500-334 MG/5ML PO SOLN
ORAL | Status: AC
  Filled 2019-06-28: qty 30

## 2019-06-28 MED ORDER — FENTANYL-BUPIVACAINE-NACL 0.5-0.125-0.9 MG/250ML-% EP SOLN
EPIDURAL | Status: AC
  Filled 2019-06-28: qty 250

## 2019-06-28 MED ORDER — LABETALOL HCL 5 MG/ML IV SOLN: 80.0000 mg | INTRAVENOUS | Status: DC | PRN

## 2019-06-28 MED ORDER — SODIUM CHLORIDE 0.9 % IV SOLN
2.0000 g | Freq: Four times a day (QID) | INTRAVENOUS | Status: DC
  Administered 2019-06-28 – 2019-06-29 (×4): 2 g via INTRAVENOUS
  Filled 2019-06-28 (×4): qty 2000

## 2019-06-28 MED ORDER — LACTATED RINGERS IV SOLN
500.0000 mL | Freq: Once | INTRAVENOUS | Status: AC
  Administered 2019-06-28: 500 mL via INTRAVENOUS

## 2019-06-28 MED ORDER — TERBUTALINE SULFATE 1 MG/ML IJ SOLN
0.2500 mg | Freq: Once | INTRAMUSCULAR | Status: AC | PRN
  Administered 2019-06-29: 07:00:00 0.25 mg via SUBCUTANEOUS
  Filled 2019-06-28: qty 1

## 2019-06-28 MED ORDER — HYDRALAZINE HCL 20 MG/ML IJ SOLN: 10.0000 mg | INTRAMUSCULAR | Status: DC | PRN

## 2019-06-28 MED ORDER — DEXTROSE IN LACTATED RINGERS 5 % IV SOLN
INTRAVENOUS | Status: DC
  Administered 2019-06-28: 14:00:00 via INTRAVENOUS

## 2019-06-28 MED ORDER — SODIUM CHLORIDE 0.9 % IV SOLN: INTRAVENOUS | Status: DC

## 2019-06-28 MED ORDER — DIPHENHYDRAMINE HCL 50 MG/ML IJ SOLN: 12.5000 mg | INTRAMUSCULAR | Status: DC | PRN

## 2019-06-28 MED ORDER — EPHEDRINE 5 MG/ML INJ
10.0000 mg | INTRAVENOUS | Status: DC | PRN
  Administered 2019-06-28: 10 mg via INTRAVENOUS
  Filled 2019-06-28: qty 10

## 2019-06-28 MED ORDER — PHENYLEPHRINE 40 MCG/ML (10ML) SYRINGE FOR IV PUSH (FOR BLOOD PRESSURE SUPPORT): 80.0000 ug | PREFILLED_SYRINGE | INTRAVENOUS | Status: DC | PRN

## 2019-06-28 MED ORDER — DEXTROSE 50 % IV SOLN
INTRAVENOUS | Status: AC
  Administered 2019-06-28: 25 mL via INTRAVENOUS
  Filled 2019-06-28: qty 50

## 2019-06-28 MED ORDER — DEXTROSE 50 % IV SOLN: 25.0000 mL | INTRAVENOUS | Status: DC | PRN

## 2019-06-28 MED ORDER — SODIUM CHLORIDE 0.9 % IV SOLN
5.0000 10*6.[IU] | Freq: Once | INTRAVENOUS | Status: DC
  Filled 2019-06-28: qty 5

## 2019-06-28 MED ORDER — DEXTROSE IN LACTATED RINGERS 5 % IV SOLN: INTRAVENOUS | Status: DC

## 2019-06-28 MED ORDER — SODIUM CHLORIDE (PF) 0.9 % IJ SOLN
INTRAMUSCULAR | Status: DC | PRN
  Administered 2019-06-28: 12 mL/h via EPIDURAL

## 2019-06-28 MED ORDER — LIDOCAINE HCL (PF) 1 % IJ SOLN
INTRAMUSCULAR | Status: DC | PRN
  Administered 2019-06-28: 5 mL via EPIDURAL
  Administered 2019-06-28: 6 mL via EPIDURAL

## 2019-06-28 MED ORDER — PENICILLIN G 3 MILLION UNITS IVPB - SIMPLE MED: 3.0000 10*6.[IU] | INTRAVENOUS | Status: DC

## 2019-06-28 MED ORDER — LABETALOL HCL 5 MG/ML IV SOLN: 20.0000 mg | INTRAVENOUS | Status: DC | PRN

## 2019-06-28 MED ORDER — DEXTROSE 50 % IV SOLN
25.0000 mL | Freq: Once | INTRAVENOUS | Status: AC
  Administered 2019-06-28: 25 mL via INTRAVENOUS

## 2019-06-28 MED ORDER — PHENYLEPHRINE 40 MCG/ML (10ML) SYRINGE FOR IV PUSH (FOR BLOOD PRESSURE SUPPORT)
PREFILLED_SYRINGE | INTRAVENOUS | Status: AC
  Filled 2019-06-28: qty 10

## 2019-06-28 MED ORDER — DEXTROSE 50 % IV SOLN
25.0000 mL | Freq: Once | INTRAVENOUS | Status: AC
  Administered 2019-06-28: 12:00:00 25 mL via INTRAVENOUS

## 2019-06-28 MED ORDER — EPHEDRINE 5 MG/ML INJ: 10.0000 mg | INTRAVENOUS | Status: DC | PRN

## 2019-06-28 MED ORDER — INSULIN REGULAR(HUMAN) IN NACL 100-0.9 UT/100ML-% IV SOLN
INTRAVENOUS | Status: DC
  Filled 2019-06-28: qty 100

## 2019-06-28 MED ORDER — LABETALOL HCL 5 MG/ML IV SOLN: 40.0000 mg | INTRAVENOUS | Status: DC | PRN

## 2019-06-28 MED ORDER — FENTANYL-BUPIVACAINE-NACL 0.5-0.125-0.9 MG/250ML-% EP SOLN: 12.0000 mL/h | EPIDURAL | Status: DC | PRN

## 2019-06-28 NOTE — Progress Notes (Signed)
[facility] Progress Note    Assessment/ Plan:   33 year old female in third trimester of pregnancy presenting with uncontrolled hypertension, chronic renal insufficiency, nephrotic range proteinuria and hypoalbuminemia.   1. CKD 3 with nephrotic range proteinuria, predating pregnancy, unclear etiology: Baseline Cr appears to be 1.7-1.9.  Reports a history of lupus in her half-sister.  UA bland without RBCs or WBCs.  ANA positive, anti-SM ab negative, SSA/ SSB negative, ENA negative.  Complements WNL.  HIV, hep serologies negative, RPR negative.   - Her Cr has risen from 1.9--> 2.67 and blood pressures are still in the severe range.  Her BUN has steadily increased w/ significant proteinuria (repeat UP/C 2 g) ->  Started on 24 hr urine to better quantify.  It appears she has AKI in pregnancy (etiology as yet undetermined; does not appear to be GN related) which could result in permanent advancement of CKD and/or progression to ESRD.    BUN 54 and may need to consider RRT for the remainder of the pregnancy especially if it continues to uptrend; BUN of 50-60 contribute to preterm labor and poor fetal outcomes. Interpreter present with the pt today and communicated .  It does not appear she has superimposed HELLP syndrome, TTP/atypical HUS.  She will need a renal biopsy after delivery. Hopefully  Her renal function will equilibrate and perhaps we can do it as an outpatient.  Discussed also that there is no absolute indication for dialysis but will evaluate daily.       2. Chronic HTN with likely superimposed pre-eclampsia: BP still not at goal. uptitrated labetalol to 600 TID 9/19, procardia XL 30 9/19, increased to 60 mg 9/20.  May need to continue the anti-hypertensives even after delivery as there is a variable time interval response.  3.  Third trimester pregnancy: 32 weeks by [place] 06/24/2019.  S/p 2 doses BMZ  4.  Gestational DM: on insulin  5.  Dispo: remains inpt.  I  appreciate MFM and OB colleagues with coordination of care.    Subjective:   Pt denies HA, SOB, CP, nausea.  Has an appetite.  BP still high despite uptitration of anti-hypertensives.   Objective:   BP 125/62 (BP Location: Left Arm)   Pulse 79   Temp 98.5 F (36.9 C) (Oral)   Resp 18   Ht 4\' 8"  (1.422 m)   Wt 83.8 kg   LMP  (LMP Unknown)   SpO2 100%   BMI 41.42 kg/m   Intake/Output Summary (Last 24 hours) at 06/28/2019 1139 Last data filed at 06/28/2019 [id number] Gross per 24 hour  Intake 2250 ml  Output 2450 ml  Net -200 ml   Weight change:   Physical Exam: Gen:NAD, sitting in bed w/ signif other bedside HEENT: no JVD, sclerae anicteric CVS: RRR no m/r/g Resp: clear bilaterally no c/w/r Abd: + gravid uterus Ext: trace L LE edema and RLE edema  Imaging: US Renal  Result Date: 06/27/2019 CLINICAL DATA:  Acute Kidney Injury EXAM: RENAL / URINARY TRACT ULTRASOUND COMPLETE COMPARISON:  None. FINDINGS: Right Kidney: Renal measurements: 9 x 3.4 x 4.3 cm = volume: 65.6 mL . Echogenicity within normal limits. No mass or hydronephrosis visualized. Left Kidney: Renal measurements: 10 x 4.8 x 4 cm = volume: 100 mL. Echogenicity within normal limits. No mass or hydronephrosis visualized. Bladder: Appears normal for degree of bladder distention. Other: Increased hepatic parenchymal echogenicity. IMPRESSION: 1.  Normal renal ultrasound. 2. Increased hepatic parenchymal echogenicity as can be seen with  hepatic steatosis. Electronically Signed   By: Kathreen Devoid   On: 06/27/2019 13:04    Labs: BMET Recent Labs  Lab 06/24/19 1041 06/26/19 0604 06/26/19 1345 06/27/19 0807 06/28/19 0545  NA 136 132* 132* 134* 135  K 4.3 4.6 4.6 4.8 4.8  CL 109 103 104 104 106  CO2 17* 19* 18* 17* 19*  GLUCOSE 140* 106* 99 106* 73  BUN 25* 43* 49* 54* 58*  CREATININE 1.95* 2.51* 2.57* 2.57* 2.67*  CALCIUM 8.5* 9.0 8.9 8.6* 8.6*   CBC Recent Labs  Lab 06/26/19 0604 06/26/19 1345 06/27/19 0604  06/27/19 1129 06/28/19 0545  WBC 9.8 9.8 7.5  --  7.2  NEUTROABS  --   --   --  5.8 5.4  HGB 12.6 12.2 13.4  --  12.7  HCT 38.3 37.9 40.7  --  39.9  MCV 82.9 82.4 83.4  --  84.5  PLT 285 287 259  --  259    Medications:    . docusate sodium  100 mg Oral Daily  . insulin aspart  0-16 Units Subcutaneous TID PC  . insulin aspart  7 Units Subcutaneous TID WC  . insulin NPH Human  10 Units Subcutaneous QHS  . insulin NPH Human  20 Units Subcutaneous QAC breakfast  . labetalol  600 mg Oral Q8H  . NIFEdipine  60 mg Oral Daily  . prenatal multivitamin  1 tablet Oral Q1200      Otelia Santee, MD 06/28/2019, 11:39 AM

## 2019-06-28 NOTE — Care Management Note (Signed)
Case Management Note  Patient Details  Name: Drusilla Baumeister MRN: [id number] Date of Birth: Apr 05, 1986  Subjective/Objective:                  Seeta Ramos-Gregorio is a 33 y.o. [id number] with Estimated Date of Delivery: 08/19/19   By  06/24/19 ultrasound at [redacted]w[redacted]d  who is admitted for [facility], [place], CKD and ? Superimposed preeclampsia  Action/Plan: PCP appointment made 9/30 at 1: 2 ( see AVS- follow up appointments)        In-House Referral:  PCP / [facility]    Status of Service:   completed    Additional Comments: CM received consult for PCP appointment.  Patient had been moved to Labor and Delivery for induction of labor when CM reviewed chart.  CM spoke to Dr. Rip Harbour and discussed setting up a PCP appointment for this patient b/c patient does not have one.  Patient may have more needs before discharge , patient has no insurance and depending on discharge meds patient may need assistance with medications either MATCH or patient may can get her medications at the [facility] depending on the medications.  Will need to call when patient is ready to discharge. Please call CM if help with medications are needed at discharge.   Rosita Fire RNC-MNN, BSN Transitions of Care Pediatrics/[facility] and [place]  06/28/2019, 4:10 PM

## 2019-06-28 NOTE — Progress Notes (Signed)
06/28/2019 - 12:30 PM  33 y.o. [id number] [redacted]w[redacted]d   Pregnancy complicated by Acute on CKD, chronic htn with concern for superimposed Pre-E uncontrolled on multiple meds, gestational diabetes on insulin.  Patient Active Problem List   Diagnosis Date Noted  . Preeclampsia, third trimester 06/24/2019  . Supervision of high risk pregnancy, antepartum 06/22/2019  . Chronic hypertension affecting pregnancy 06/22/2019  . Hypertension 07/20/2018  . SAB (spontaneous abortion) 07/20/2018  . Kidney disease 07/20/2018    [name] is admitted for IOL    Subjective:  [name] reports she has been doing well. Denies any contractions, leaking of fluid or bleeding. Discussed why we are inducing and she voices understanding.  Objective:   Vitals:   06/28/19 1029 06/28/19 1035 06/28/19 1143 06/28/19 1217  BP: 140/77  (!) 151/82 126/72  Pulse: 79  73 73  Resp: 16  16 18   Temp: 98.6 F (37 C)     TempSrc: Oral     SpO2:      Weight:  83.8 kg    Height:  4\' 8"  (1.422 m)      Current Vital Signs 24h Vital Sign Ranges  T 98.6 F (37 C) Temp  Avg: 98.1 F (36.7 C)  Min: 97.6 F (36.4 C)  Max: 98.6 F (37 C)  BP 126/72 BP  Min: 119/67  Max: 179/93  HR 73 Pulse  Avg: 77.4  Min: 72  Max: 90  RR 18 Resp  Avg: 17.5  Min: 16  Max: 18  SaO2 100 % Room Air SpO2  Avg: 99.2 %  Min: 98 %  Max: 100 %       24 Hour I/O Current Shift I/O  Time Ins Outs 09/20 0701 - 09/21 0700 In: [id number] [P.O.:2640] Out: 2450 [Urine:2450] 09/21 0701 - 09/21 1900 In: 210 [P.O.:210] Out: 500 [Urine:500]   FHR: 135 baseline, good variability, + accels, no decels.  Toco: mild, irregular SVE: FT, thick, ballotable   Assessment & Plan:  FHT CAT 1 GBS: unknown, rectovaginal swab collected, on amp q 6 hour Pitocin: will start. Confirmed vertex  Analgesia: none.   Will induce labor at 32 weeks given worsening renal function/ tenuous fluid status with patient near the point of needing HD while still having severe  range pressures.. Discussed with MFM who agrees with IOL.   CKD, near ESRD-nephro following, appreciate recs, continue aggressive BP control, monitor urine output. Will not overload patient with fluids.   Chronic htn with Pre-eclampsia with severe range BP and HELLP syndrome: continue labetolol, procardia, prns for severe range pressures. Continue Magnesium 1g/hour, Mag checks q 6 hours.  GDM: blood sugar checks q hours, if issues with control will initiate glucostabilizer  TOLAC: hx of successful VBAC. Will induce with pitocin. We discussed the possibility of need for repeat C/S if baby were to flip to breech or was unable to tolerate induction. Patient reports understanding of the risks/possiblities and wishes to proceed.

## 2019-06-28 NOTE — Consult Note (Signed)
Consult MFM Telemedicine  Requesting provider: Arlina Robes, MD Date of Service 06/27/19 Reason for request: Preeclampsia with worsening chronic kidney disease  Renee Porter is a 23 you hispanic G4P2 who 32w 2 d with preeclampsia with worsening chronic kidney disease, I have discussed the care of Renee Porter with Dr. Rip Harbour and Dr. Hollie Salk beginning 09/19 and daily since then.  I spoke with Renee Porter via interpreter. She denies headache, vision changes or RUQ pain. She was admitted with elevated Cr of 1.7 that has continued to increase to 2.57 with a BUN also increasing to 54 and she has a GFR that continues to fall  Now 24. Her blood pressure has been stable but has required mild increase or procardia.  Her preeclampsia labs are otherwise stable, no increase of LFT's. Her P/C ratio decreased to 2.0 from 8. She was started on 24 hour urine collection to better assess her renal function. She is not anemic. She does not appear to have Lupus normal ANA and SSA/SSB antibodies.  She had normal fetal growth of an EFW 15% with appropriate amniotic fluid volume.  Vitals with BMI 06/28/2019 06/28/2019 06/28/2019  Height - - -  Weight 184 lbs 12 oz - -  BMI [id number] - -  Systolic - 0000000 [id number]  Diastolic - 62 73  Pulse - 79 84   CMP Latest Ref Rng & Units 06/28/2019 06/27/2019 06/26/2019  Glucose 70 - 99 mg/dL 73 106(H) 99  BUN 6 - 20 mg/dL 58(H) 54(H) 49(H)  Creatinine 0.44 - 1.00 mg/dL 2.67(H) 2.57(H) 2.57(H)  Sodium 135 - 145 mmol/L 135 134(L) 132(L)  Potassium 3.5 - 5.1 mmol/L 4.8 4.8 4.6  Chloride 98 - 111 mmol/L 106 104 104  CO2 22 - 32 mmol/L 19(L) 17(L) 18(L)  Calcium 8.9 - 10.3 mg/dL 8.6(L) 8.6(L) 8.9  Total Protein 6.5 - 8.1 g/dL 5.7(L) 5.7(L) 5.7(L)  Total Bilirubin 0.3 - 1.2 mg/dL <0.1(L) 0.6 0.3  Alkaline Phos 38 - 126 U/L 122 115 123  AST 15 - 41 U/L 50(H) 33 21  ALT 0 - 44 U/L 57(H) 38 28   I/O last 3 completed shifts: In: 3200 [P.O.:3200] Out: 76 [Urine:4250]  Past Medical History:   Diagnosis Date  . Hypertension    Past Surgical History:  Procedure Laterality Date  . CESAREAN SECTION    . FOOT SURGERY     Relationships  Social connections  . Talks on phone: Not on file  . Gets together: Not on file  . Attends religious service: Not on file  . Active member of club or organization: Not on file  . Attends meetings of clubs or organizations: Not on file  . Relationship status: Not on file   Family History  Problem Relation Age of Onset  . Diabetes Father    Scheduled Meds: . docusate sodium  100 mg Oral Daily  . enoxaparin (LOVENOX) injection  30 mg Subcutaneous Q24H  . insulin aspart  0-16 Units Subcutaneous TID PC  . insulin aspart  7 Units Subcutaneous TID WC  . insulin NPH Human  10 Units Subcutaneous QHS  . insulin NPH Human  20 Units Subcutaneous QAC breakfast  . labetalol  600 mg Oral Q8H  . NIFEdipine  60 mg Oral Daily  . prenatal multivitamin  1 tablet Oral Q1200   Continuous Infusions: . lactated ringers Stopped (06/24/19 2308)   . docusate sodium  100 mg Oral Daily  . enoxaparin (LOVENOX) injection  30 mg Subcutaneous Q24H  . insulin  aspart  0-16 Units Subcutaneous TID PC  . insulin aspart  7 Units Subcutaneous TID WC  . insulin NPH Human  10 Units Subcutaneous QHS  . insulin NPH Human  20 Units Subcutaneous QAC breakfast  . labetalol  600 mg Oral Q8H  . NIFEdipine  60 mg Oral Daily  . prenatal multivitamin  1 tablet Oral Q1200    Impression/Counseling:   Chronic hypertension with superimposed preeclampsia.  I reviewed the findings with Renee Porter. We discussed the increased risk of worsening kidney disease with pregnancy is entered with a Cr above 1.5. We discussed the increased need of dialysis given her BUN and possible iatrogenic preterm delivery given her worsening blood pressure and increased BP requirement.  It is fully unclear of the etiology of her renal disease, however, a renal biopsy would help further evaluate her renal  disease in the postpartum period.   Renee Porter is s/p Mag and BMZ. Be cautious of Magnesium dose if delivery planned given renal condition.She is making appropriate urine output.  At this time recommend: 1) Continue daily NST 2) Labs every 6 hours 3) Delivery by 34 weeks unless increasing blood pressure requirements 4) NICU consult 5) Continue Nephrology input on dialysis timing (per Dr. Hollie Salk)   Addendum: I discussed with Dr. Rip Harbour the plan to move toward delivery given worsening hypertension. Will attempt TOLAC. Magnesium- low dose serial mag levels.  I spent 60 minutes with >50% in non-face to face communication. The consultation was performed with interpreter ID [id number]  Vikki Ports, MD

## 2019-06-28 NOTE — Progress Notes (Signed)
Inpatient Diabetes Program Recommendations  Diabetes Treatment Program Recommendations  ADA Standards of Care 2020 Diabetes in Pregnancy Target Glucose Ranges:  Fasting: 60 - 90 mg/dL Preprandial: 60 - 105 mg/dL 1 hr postprandial: Less than 140mg /dL (from first bite of meal) 2 hr postprandial: Less than 120 mg/dL (from first bite of meal)   Results for Renee Porter, Renee Porter (MRN [id number]) as of 06/28/2019 14:23  Ref. Range 06/28/2019 05:36 06/28/2019 10:30 06/28/2019 12:02 06/28/2019 12:29 06/28/2019 13:49  Glucose-Capillary Latest Ref Range: 70 - 99 mg/dL 67 (L)  Novolog 7 units  NPH 20 units 103 (H) 40 (LL) 88 41 (LL)  Results for Renee Porter (MRN [id number]) as of 06/28/2019 14:23  Ref. Range 06/27/2019 07:34 06/27/2019 10:02 06/27/2019 13:14 06/27/2019 15:05 06/27/2019 15:33 06/27/2019 16:57 06/27/2019 17:15 06/27/2019 17:56 06/27/2019 20:02  Glucose-Capillary Latest Ref Range: 70 - 99 mg/dL 96  Novolog 7 units  NPH 24 units 113 (H)  Novolog 2 units 118 (H)  Novolog 7 units 69 (L) 83 47 (L) 76 78 131 (H)  Novolog 3 units  NPH 10 units  Results for Renee Porter, Renee Porter (MRN [id number]) as of 06/28/2019 14:23  Ref. Range 06/24/2019 16:03  Hemoglobin A1C Latest Ref Range: 4.8 - 5.6 % 6.1 (H)  Results for Renee Porter (MRN [id number]) as of 06/28/2019 14:23  Ref. Range 06/24/2019 10:41  Glucose Latest Ref Range: 70 - 99 mg/dL 140 (H)   Review of Glycemic Control  Diabetes history: NO Outpatient Diabetes medications: NA Current orders for Inpatient glycemic control: IV insulin   Inpatient Diabetes Program Recommendations:   Insulin: Noted patient was ordered NPH 20 units QAM, NPH 10 units QHS, Novolog 7 units TID with meals, and Novolog 0-16 units TID (2 hour post prandial). Noted all SQ insulin has been discontinued and IV insulin ordered if needed per Diabetic Pregnant Patient order set.   NOTE: Per chart review, patient has no prior hx of DM and per note on 06/25/19 by DR.  Constant patient newly dx with GDM and insulin was started. Per Dr. Dara Lords note today patient is [phone] with acute on chronic kidney disease. Patient was given Betamethasone 12 mg on 06/24/19 at 16:46 and again on 06/25/19 at 13:10. Initial glucose was 140 mg/dl on 06/24/19. Patient was ordered NPH 20 units QAM, NPH 10 units QHS, Novolog 7 units TID with meals, and Novolog 0-16 units TID (2 hour post prandial) which was discontinued today. Patient has been experiencing hypoglycemia over the past 3 days. Patient last received NPH 20 units today at 8:28 am and Novolog 7 units at 8:28 am today. Noted recurrent hypoglycemia today.  Per Dr. Dara Lords note today patient is being induced "given worsening renal function/ tenuous fluid status with patient near the point of needing HD while still having severe range pressures".  Patient now has D5LR @ 75 ml/hr ordered and IV insulin if needed. Agree with discontinuing all SQ insulin orders and using IV insulin if needed.  Recurrent hypoglycemia may continue to occur until NPH wears off completely.  Anticipate glucose will normalize following delivery. Spoke with Alyse Low, RN and she reports patient is ordered clear liquids and has had some apple juice and jello today. Would encourage PO intake of clear liquids while appropriate especially given recurrent hypoglycemia and having received NPH and Novolog this morning.  Will continue to follow along.  Thanks, Barnie Alderman, RN, MSN, CDE Diabetes Coordinator Inpatient Diabetes Program [phone] (Team Pager from 8am to 5pm)

## 2019-06-28 NOTE — Progress Notes (Signed)
06/28/2019 - 7:34 PM  33 y.o. Renee Porter [redacted]w[redacted]d   Pregnancy complicated bycute on CKD, chronic htn with concern for superimposed Pre-E uncontrolled on multiple meds, gestational diabetes on insulin  Patient Active Problem List   Diagnosis Date Noted  . Preeclampsia, third trimester 06/24/2019  . Supervision of high risk pregnancy, antepartum 06/22/2019  . Chronic hypertension affecting pregnancy 06/22/2019  . Hypertension 07/20/2018  . SAB (spontaneous abortion) 07/20/2018  . Kidney disease 07/20/2018    Renee Porter is admitted for IOL   Subjective:  Patient starting to feel cramps, but is able to sleep through them Objective:   Vitals:   06/28/19 1712 06/28/19 1742 06/28/19 1818 06/28/19 1856  BP: (!) 158/90 (!) 149/92 (!) 149/83 (!) 156/81  Pulse: 72 80 73 75  Resp: 16 14 16 14   Temp:      TempSrc:      SpO2:      Weight:      Height:        Current Vital Signs 24h Vital Sign Ranges  T 98.8 F (37.1 C) Temp  Avg: 98.3 F (36.8 C)  Min: 97.6 F (36.4 C)  Max: 98.8 F (37.1 C)  BP (!) 156/81 BP  Min: 119/67  Max: 179/93  HR 75 Pulse  Avg: 74.8  Min: 66  Max: 90  RR 14 Resp  Avg: 16.3  Min: 14  Max: 18  SaO2 100 % Room Air SpO2  Avg: 99.3 %  Min: 98 %  Max: 100 %       24 Hour I/O Current Shift I/O  Time Ins Outs 09/20 0701 - 09/21 0700 In: 2640 [P.O.:2640] Out: 2450 [Urine:2450] No intake/output data recorded.   FHR: 130 baseline, + variability, + 10x10 accels, no decels.  Toco: mild, q54mins SVE: FT, long, ballotable  FB inserted with help of speculum. Inflated to 60cc, patient tolerated procedure well.   Assessment & Plan:  FHT CAT 1 GBS: unknown, continues on ampicillin Pitocin continue low dose protocol, foley bulb inserted Analgesia: none at this time   Will induce labor at 32 weeks given worsening renal function/ tenuous fluid status with patient near the point of needing HD while still having severe range pressures.. Discussed with MFM who  agrees with IOL.   CKD, near ESRD-nephro following, appreciate recs, continue aggressive BP control, monitor urine output. Will not overload patient with fluids. Continues to have good UOP. Monitor fluid status closely, keeping Maintenance fluids to 29mL/hour + additives for a total of near 164mL/hour on average  Chronic htn with Pre-eclampsia with severe range BP and  Concern of HELLP syndrome given her mildly elevated transaminases: continue labetolol, procardia, prns for severe range pressures. Continue Magnesium 1g/hour, Mag checks q 6 hours.  GDM: blood sugar checks q2 hours, initiated glucostabilizer with D5LR at 55cc/hour given hypoglycemia, hypoglycemia protocol  TOLAC: hx of successful VBAC. Continue pitocin

## 2019-06-28 NOTE — Progress Notes (Signed)
06/28/2019 - 3:57 PM  33 y.o. [id number] [redacted]w[redacted]d   Pregnancy complicated by Acute on CKD, chronic htn with concern for superimposed Pre-E uncontrolled on multiple meds, gestational diabetes on insulin  Patient Active Problem List   Diagnosis Date Noted  . Preeclampsia, third trimester 06/24/2019  . Supervision of high risk pregnancy, antepartum 06/22/2019  . Chronic hypertension affecting pregnancy 06/22/2019  . Hypertension 07/20/2018  . SAB (spontaneous abortion) 07/20/2018  . Kidney disease 07/20/2018    [name] is admitted for IOL   Subjective:  Doing well, contractions are becoming more painful Objective:   Vitals:   06/28/19 1319 06/28/19 1347 06/28/19 1425 06/28/19 1528  BP: (!) 157/86 (!) 145/84 137/82 (!) 142/75  Pulse: 72 69 67 72  Resp: 16 16 17 18   Temp:    98.8 F (37.1 C)  TempSrc:    Oral  SpO2:      Weight:      Height:        Current Vital Signs 24h Vital Sign Ranges  T 98.8 F (37.1 C) Temp  Avg: 98.2 F (36.8 C)  Min: 97.6 F (36.4 C)  Max: 98.8 F (37.1 C)  BP (!) 142/75 BP  Min: 119/67  Max: 179/93  HR 72 Pulse  Avg: 75.4  Min: 66  Max: 90  RR 18 Resp  Avg: 16.9  Min: 14  Max: 18  SaO2 100 % Room Air SpO2  Avg: 99.4 %  Min: 98 %  Max: 100 %       24 Hour I/O Current Shift I/O  Time Ins Outs 09/20 0701 - 09/21 0700 In: 2640 [P.O.:2640] Out: [id number] [Urine:2450] 09/21 0701 - 09/21 1900 In: 1160.3 [P.O.:510; I.V.:550.3] Out: 1350 [id number]   FHR: 130 baseline, good variability, +10x10 accels, no decels.  Toco: q3-5 SVE: FT/thick/ballotable   Assessment & Plan:  FHT CAT 1 GBS: unknown on pcn Pitocin Will restart at 4 since presentation is reconfirmed Analgesia: none at this time  Will induce labor at 32 weeks given worsening renal function/ tenuous fluid status with patient near the point of needing HD while still having severe range pressures.. Discussed with MFM who agrees with IOL.   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 Will not overload patient with fluids. Continues to have good UOP. Monitor fluid status closely, keeping Maintenance fluids to 19mL/hour + additives for a total of near 117mL/hour on average  Chronic htn with Pre-eclampsia with severe range BP and HELLP syndrome: continue labetolol, procardia, prns for severe range pressures. Continue Magnesium 1g/hour, Mag checks q 6 hours.  GDM: blood sugar checks q2 hours, initiated glucostabilizer with D5LR at 55cc/hour given hypoglycemia, hypoglycemia protocol  TOLAC: hx of successful VBAC. Continue pitocin

## 2019-06-28 NOTE — Progress Notes (Addendum)
Patient ID: Renee Porter, female   DOB: 08/25/1986, 33 y.o.   MRN: [id number] [facility] Attending  Pt discussed in morning report. MFM recommended proceed toward delivery d/t CHTN with Superimposed PEC with uncontrolled BP requiring increasing dosage in BP meds.  IOL reviewed with pt and FOB with interrupter. Pt with H/O successful VBAC. TOLAC vs RLTCS reviewed with pt. R/B discussed. Following discussion, pt is amendable to IOL and TOLAC.   U/S vertex  Nursing and L & D notified. Will consult NICU as well.  10:59 LFT's elevated so now with Dx of HELLP.

## 2019-06-28 NOTE — Plan of Care (Signed)
24 hour urine for total protein ends at 0915. Has had a few hypoglycemic episodes; MD continues to make adjustments to achieve euglycemia. BP readings are trending toward normotensive with some occasional severe range BP's necessitating PRN IV push meds. Will continue to monitor labwork, including trend in increased serum creatinine and BUN levels. Patient is followed by nephrology.

## 2019-06-28 NOTE — Progress Notes (Signed)
Patient ID: Linze Tonti, female   DOB: 06-21-1986, 33 y.o.   MRN: [id number] [place]) NOTE  Jakaiyah Nunnelee is a 33 y.o. [id number] at [redacted]w[redacted]d  who is admitted for [facility], and CKD  Fetal presentation is unsure. Length of Stay:  4  Days  Date of admission:06/24/2019  Subjective: Patient reports feeling well. She denies HA, visual changes, RUQ/epigastric pain, nausea or emesis Patient reports the fetal movement as active. Patient reports uterine contraction  activity as none. Patient reports  vaginal bleeding as none. Patient describes fluid per vagina as None.  Vitals:  Blood pressure 125/75, pulse 90, temperature 97.9 F (36.6 C), temperature source Oral, resp. rate 18, height 4\' 8"  (1.422 m), weight 83.5 kg, SpO2 100 %, unknown if currently breastfeeding. Vitals:   06/28/19 0550 06/28/19 0624 06/28/19 0644 06/28/19 0700  BP: (!) 179/93 (!) 175/90 119/67 125/75  Pulse: 73 76 80 90  Resp: 18     Temp: 97.9 F (36.6 C)     TempSrc: Oral     SpO2: 100%     Weight:      Height:       Physical Examination:  GENERAL: Well-developed, well-nourished female in no acute distress.  LUNGS: Clear to auscultation bilaterally.  HEART: Regular rate and rhythm. ABDOMEN: Soft, nontender, gravid PELVIC: Not indicated EXTREMITIES: No cyanosis, clubbing, or edema, 2+ distal pulses.   Fetal Monitoring:  Baseline 135, min variability, +accels, no decels     Labs:  Results for orders placed or performed during the hospital encounter of 06/24/19 (from the past 24 hour(s))  Glucose, capillary   Collection Time: 06/27/19  7:34 AM  Result Value Ref Range   Glucose-Capillary 96 70 - 99 mg/dL   Comment 1 Notify RN    Comment 2 Document in Chart   Comprehensive metabolic panel   Collection Time: 06/27/19  8:07 AM  Result Value Ref Range   Sodium 134 (L) 135 - 145 mmol/L   Potassium 4.8 3.5 - 5.1 mmol/L   Chloride 104 98 - 111 mmol/L   CO2 17 (L) 22 - 32 mmol/L    Glucose, Bld 106 (H) 70 - 99 mg/dL   BUN 54 (H) 6 - 20 mg/dL   Creatinine, Ser 2.57 (H) 0.44 - 1.00 mg/dL   Calcium 8.6 (L) 8.9 - 10.3 mg/dL   Total Protein 5.7 (L) 6.5 - 8.1 g/dL   Albumin 2.2 (L) 3.5 - 5.0 g/dL   AST 33 15 - 41 U/L   ALT 38 0 - 44 U/L   Alkaline Phosphatase 115 38 - 126 U/L   Total Bilirubin 0.6 0.3 - 1.2 mg/dL   GFR calc non Af Amer 24 (L) >60 mL/min   GFR calc Af Amer 28 (L) >60 mL/min   Anion gap 13 5 - 15  Protein / creatinine ratio, urine   Collection Time: 06/27/19  9:20 AM  Result Value Ref Range   Creatinine, Urine 62.56 mg/dL   Total Protein, Urine 125 mg/dL   Protein Creatinine Ratio 2.00 (H) 0.00 - 0.15 mg/mg[Cre]  Glucose, capillary   Collection Time: 06/27/19 10:02 AM  Result Value Ref Range   Glucose-Capillary 113 (H) 70 - 99 mg/dL   Comment 1 Notify RN    Comment 2 Document in Chart   Differential   Collection Time: 06/27/19 11:29 AM  Result Value Ref Range   Neutrophils Relative % 80 %   Neutro Abs 5.8 1.7 - 7.7 K/uL   Lymphocytes Relative 9 %  Lymphs Abs 0.6 (L) 0.7 - 4.0 K/uL   Monocytes Relative 10 %   Monocytes Absolute 0.7 0.1 - 1.0 K/uL   Eosinophils Relative 0 %   Eosinophils Absolute 0.0 0.0 - 0.5 K/uL   Basophils Relative 0 %   Basophils Absolute 0.0 0.0 - 0.1 K/uL   Immature Granulocytes 1 %   Abs Immature Granulocytes 0.06 0.00 - 0.07 K/uL  Technologist smear review   Collection Time: 06/27/19 11:29 AM  Result Value Ref Range   WBC Morphology MORPHOLOGY UNREMARKABLE    RBC Morphology MORPHOLOGY UNREMARKABLE    Tech Review GIANT PLATELETS SEEN   Glucose, capillary   Collection Time: 06/27/19  1:14 PM  Result Value Ref Range   Glucose-Capillary 118 (H) 70 - 99 mg/dL  Glucose, capillary   Collection Time: 06/27/19  3:05 PM  Result Value Ref Range   Glucose-Capillary 69 (L) 70 - 99 mg/dL  Glucose, capillary   Collection Time: 06/27/19  3:33 PM  Result Value Ref Range   Glucose-Capillary 83 70 - 99 mg/dL   Comment  1 Notify RN    Comment 2 Document in Chart   Glucose, capillary   Collection Time: 06/27/19  4:57 PM  Result Value Ref Range   Glucose-Capillary 47 (L) 70 - 99 mg/dL  Glucose, capillary   Collection Time: 06/27/19  5:15 PM  Result Value Ref Range   Glucose-Capillary 76 70 - 99 mg/dL  Glucose, capillary   Collection Time: 06/27/19  5:56 PM  Result Value Ref Range   Glucose-Capillary 78 70 - 99 mg/dL  Glucose, capillary   Collection Time: 06/27/19  8:02 PM  Result Value Ref Range   Glucose-Capillary 131 (H) 70 - 99 mg/dL  Glucose, capillary   Collection Time: 06/28/19  5:36 AM  Result Value Ref Range   Glucose-Capillary 67 (L) 70 - 99 mg/dL  CBC with Differential/Platelet   Collection Time: 06/28/19  5:45 AM  Result Value Ref Range   WBC 7.2 4.0 - 10.5 K/uL   RBC 4.72 3.87 - 5.11 MIL/uL   Hemoglobin 12.7 12.0 - 15.0 g/dL   HCT 39.9 36.0 - 46.0 %   MCV 84.5 80.0 - 100.0 fL   MCH 26.9 26.0 - 34.0 pg   MCHC 31.8 30.0 - 36.0 g/dL   RDW 17.4 (H) 11.5 - 15.5 %   Platelets 259 150 - 400 K/uL   nRBC 0.0 0.0 - 0.2 %   Neutrophils Relative % 74 %   Neutro Abs 5.4 1.7 - 7.7 K/uL   Lymphocytes Relative 14 %   Lymphs Abs 1.0 0.7 - 4.0 K/uL   Monocytes Relative 11 %   Monocytes Absolute 0.8 0.1 - 1.0 K/uL   Eosinophils Relative 0 %   Eosinophils Absolute 0.0 0.0 - 0.5 K/uL   Basophils Relative 0 %   Basophils Absolute 0.0 0.0 - 0.1 K/uL   Immature Granulocytes 1 %   Abs Immature Granulocytes 0.06 0.00 - 0.07 K/uL  Comprehensive metabolic panel   Collection Time: 06/28/19  5:45 AM  Result Value Ref Range   Sodium 135 135 - 145 mmol/L   Potassium 4.8 3.5 - 5.1 mmol/L   Chloride 106 98 - 111 mmol/L   CO2 19 (L) 22 - 32 mmol/L   Glucose, Bld 73 70 - 99 mg/dL   BUN 58 (H) 6 - 20 mg/dL   Creatinine, Ser 2.67 (H) 0.44 - 1.00 mg/dL   Calcium 8.6 (L) 8.9 - 10.3 mg/dL   Total  Protein 5.7 (L) 6.5 - 8.1 g/dL   Albumin 2.2 (L) 3.5 - 5.0 g/dL   AST 50 (H) 15 - 41 U/L   ALT 57 (H) 0 -  44 U/L   Alkaline Phosphatase 122 38 - 126 U/L   Total Bilirubin <0.1 (L) 0.3 - 1.2 mg/dL   GFR calc non Af Amer 23 (L) >60 mL/min   GFR calc Af Amer 27 (L) >60 mL/min   Anion gap 10 5 - 15    Imaging Studies:    US Renal  Result Date: 06/27/2019 CLINICAL DATA:  Acute Kidney Injury EXAM: RENAL / URINARY TRACT ULTRASOUND COMPLETE COMPARISON:  None. FINDINGS: Right Kidney: Renal measurements: 9 x 3.4 x 4.3 cm = volume: 65.6 mL . Echogenicity within normal limits. No mass or hydronephrosis visualized. Left Kidney: Renal measurements: 10 x 4.8 x 4 cm = volume: 100 mL. Echogenicity within normal limits. No mass or hydronephrosis visualized. Bladder: Appears normal for degree of bladder distention. Other: Increased hepatic parenchymal echogenicity. IMPRESSION: 1.  Normal renal ultrasound. 2. Increased hepatic parenchymal echogenicity as can be seen with hepatic steatosis. Electronically Signed   By: Kathreen Devoid   On: 06/27/2019 13:04   [place] Mfm Ob Detail +14 Wk  Result Date: 06/25/2019 ----------------------------------------------------------------------  OBSTETRICS REPORT                       (Signed Final 06/25/2019 02:46 pm) ---------------------------------------------------------------------- Patient Info  ID #:       [id number]                          D.O.B.:  05/01/1986 (32 yrs)  Name:       Jalah RAMOS-GREGORIO              Visit Date: 06/24/2019 11:14 am ---------------------------------------------------------------------- Performed By  Performed By:     Berlinda Last          Ref. Address:      [address]                    [place] [address]  Attending:        Sander Nephew      Secondary Phy.:    MAU Nursing-                    MD  MAU/Triage  Referred By:      Renetta Chalk          [facility]                    [place] ---------------------------------------------------------------------- Orders   #  Description                          Code         Ordered By   1  [place] MFM OB DETAIL +14 WK              [id number]     Maye Hides  ----------------------------------------------------------------------   #  Order #                    Accession #                 Episode #   1  [id number]                  [id number]                  [id number]  ---------------------------------------------------------------------- Indications   Pre-eclampsia                                  O14.90   [redacted] weeks gestation of pregnancy                Z3A.32   Insufficient Prenatal Care                     O09.30   Previous cesarean delivery, antepartum         O34.219   Encounter for antenatal screening for          Z36.3   malformations  ---------------------------------------------------------------------- Fetal Evaluation  Num Of Fetuses:          1  Fetal Heart Rate(bpm):   149  Cardiac Activity:        Observed  Presentation:            Cephalic  Placenta:                Posterior  P. Cord Insertion:       Visualized  Amniotic Fluid  AFI FV:      Within normal limits  AFI Sum(cm)     %Tile       Largest Pocket(cm)  16.3            59          5.7  RUQ(cm)       RLQ(cm)       LUQ(cm)        LLQ(cm)  5.7           4.9           3.7            2 ---------------------------------------------------------------------- Biometry  BPD:      83.3  mm     G. Age:  33w 4d         83  %    CI:  76.74   %    70 - 86                                                          FL/HC:       18.8  %    19.1 - 21.3  HC:      301.2  mm     G. Age:  33w 3d         71  %    HC/AC:       1.11       0.96 - 1.17  AC:      270.3  mm     G. Age:  31w 1d         23  %    FL/BPD:      67.8  %    71 - 87  FL:       56.5  mm     G. Age:  29w 5d          2  %    FL/AC:       20.9  %     20 - 24  HUM:      50.6  mm     G. Age:  29w 5d        < 5  %  Est. FW:    1696   gm   3 lb 12 oz      15  % ---------------------------------------------------------------------- OB History  Gravidity:    4         Term:   2        Prem:   0        SAB:   1  TOP:          0       Ectopic:  0        Living: 2 ---------------------------------------------------------------------- Gestational Age  LMP:           26w 2d        Date:  12/22/18                 EDD:   09/28/19  U/S Today:     32w 0d                                        EDD:   08/19/19  Best:          32w 0d     Det. By:  U/S (06/24/19)           EDD:   08/19/19 ---------------------------------------------------------------------- Anatomy  Cranium:               Appears normal         Aortic Arch:            Not well visualized  Cavum:                 Appears normal         Ductal Arch:            Appears normal  Ventricles:            Appears normal  Diaphragm:              Appears normal  Choroid Plexus:        Appears normal         Stomach:                Appears normal, left                                                                        sided  Cerebellum:            Appears normal         Abdomen:                Appears normal  Posterior Fossa:       Appears normal         Abdominal Wall:         Not well visualized  Nuchal Fold:           Not applicable ([id number]    Cord Vessels:           Appears normal ([redacted]                         wks GA)                                        vessel cord)  Face:                  Profile nl; orbits not Kidneys:                Appear normal                         well visualized  Lips:                  Appears normal         Bladder:                Appears normal  Thoracic:              Appears normal         Spine:                  Appears normal  Heart:                 Not well visualized    Upper Extremities:      Visualized  RVOT:                  Not well visualized    Lower Extremities:      Right  Clubfoot  LVOT:                  Appears normal  Other:  Nasal bone visualized. Hands not well visualized. left heel visualized.          Technically difficult due to advanced GA and fetal position. ---------------------------------------------------------------------- Cervix Uterus Adnexa  Cervix  Not visualized (advanced GA >24wks) ---------------------------------------------------------------------- Impression  Normal interval growth.  No ultrasonic  evidence of structural  fetal anomalies.  Suboptimal views of the fetal anatomy was obtained  secondary to fetal position.  The right foot appears clubbed in some images. This finding  was isolated. There was good movement of this extremity ---------------------------------------------------------------------- Recommendations  Follow up growth in 3-4 weeks and to assess clubbed foot. ----------------------------------------------------------------------               Sander Nephew, MD Electronically Signed Final Report   06/25/2019 02:46 pm ----------------------------------------------------------------------    Medications:  Scheduled . docusate sodium  100 mg Oral Daily  . enoxaparin (LOVENOX) injection  30 mg Subcutaneous Q24H  . insulin aspart  0-16 Units Subcutaneous TID PC  . insulin aspart  7 Units Subcutaneous TID WC  . insulin NPH Human  10 Units Subcutaneous QHS  . insulin NPH Human  20 Units Subcutaneous QAC breakfast  . labetalol  600 mg Oral Q8H  . NIFEdipine  60 mg Oral Daily  . prenatal multivitamin  1 tablet Oral Q1200   I have reviewed the patient's current medications.  ASSESSMENT:  Patient Active Problem List   Diagnosis Date Noted  . Preeclampsia, third trimester 06/24/2019  . Supervision of high risk pregnancy, antepartum 06/22/2019  . Chronic hypertension affecting pregnancy 06/22/2019  . Hypertension 07/20/2018  . SAB (spontaneous abortion) 07/20/2018  . Kidney disease 07/20/2018    PLAN: 33 yo with CKD, CHTN  ? Superimposed preeclampsia and new diagnosis of GDM - patient with continued severe range BP. Continue Labetalol 800 TID and procardia 60 daily (dose change per Renal) - GDM with episodes of hypoglycemia- evening NPH decreased from 24 units to 20 units. Continue close monitoring - Follow up am CMP and 24 hour urine collection - Appreciate nephrology partnership - Patient completed magnesium sulfate and BMZ. Plan for delivery at 34 weeks - Patient with a previous cesarean section followed by TOLAC. Patient is undecided at this time regarding mode of delivery - Continue antepartum care  Starling Christofferson 06/28/2019,7:17 AM

## 2019-06-28 NOTE — Progress Notes (Signed)
Hypoglycemic Event  CBG: 40  Treatment:1/2 amp (25 mL) 5% dextrose via IV push  Symptoms: shaky, sweating  Follow-up CBG: Time:1229 CBG Result:88  Possible Reasons for Event:pt received NPH this morning prior to induction of labor, now clear liquid diet only  Comments/MD notified: Dr. Marylynn Pearson

## 2019-06-28 NOTE — Anesthesia Procedure Notes (Signed)
Epidural Patient location during procedure: OB Start time: 06/28/2019 9:49 PM End time: 06/28/2019 9:52 PM  Staffing Anesthesiologist: Lyn Hollingshead, MD Performed: anesthesiologist   Preanesthetic Checklist Completed: patient identified, site marked, surgical consent, pre-op evaluation, timeout performed, IV checked, risks and benefits discussed and monitors and equipment checked  Epidural Patient position: sitting Prep: site prepped and draped and DuraPrep Patient monitoring: continuous pulse ox and blood pressure Approach: midline Location: L3-L4 Injection technique: LOR air  Needle:  Needle type: Tuohy  Needle gauge: 17 G Needle length: 9 cm and 9 Needle insertion depth: 6 cm Catheter type: closed end flexible Catheter size: 19 Gauge Catheter at skin depth: 11 cm Test dose: negative and Other  Assessment Events: blood not aspirated, injection not painful, no injection resistance, negative IV test and no paresthesia  Additional Notes Reason for block:procedure for pain

## 2019-06-28 NOTE — Progress Notes (Signed)
Spanish interpreter number [phone] via telephone present for epidural consent and procedure.  Nicholes Calamity, RN

## 2019-06-28 NOTE — Progress Notes (Signed)
Hypoglycemic Event  CBG: 41  Treatment: 1/2 amp (43mL) 5% dextrose via IV push; PO liquids 8 oz apple juice  Symptoms: shaky, sweating  Follow-up CBG: [id number] CBG Result:131  Possible Reasons for Event:pt received NPH this morning prior to induction of labor, now clear liquid diet only  Comments/MD notified:Dr. Driver    Lenox Ponds

## 2019-06-28 NOTE — Consult Note (Signed)
Neonatology Consult to Antenatal Patient:  I was asked by Dr. Rip Harbour to see this patient in order to provide antenatal counseling due to prematurity.  [name] is 60 4/[redacted] weeks EGA with worsening chronic kidney disease and CHTN with super-imposed pre-eclampsia and GDM.  She is now undergoing IOL for maternal indications, including need for dialysis. Growth 15%tile.  Mom receiving insulin, labetalol, and nifedipine.  She is BTMZ complete 9/18 and received IV Mag.   I spoke with the patient and father with an interpretor. We discussed the worst case of delivery in the next 1-2 days, including usual DR management, possible respiratory complications and need for support, IV access, feedings, LOS, and long term outcomes. She did not have any questions at this time.  I/we would be glad to come back if she has more questions later.  Thank you for asking me to see this patient.  Monia Sabal Katherina Mires, MD Neonatologist  The total length of face-to-face or floor/unit time for this encounter was 25 minutes. Counseling and/or coordination of care was 40 minutes of the above.

## 2019-06-28 NOTE — Anesthesia Preprocedure Evaluation (Signed)
Anesthesia Evaluation  Patient identified by MRN, date of birth, ID band Patient awake    Reviewed: Allergy & Precautions, H&P , NPO status , Patient's Chart, lab work & pertinent test results  Airway Mallampati: II  TM Distance: >3 FB Neck ROM: full    Dental no notable dental hx. (+) Teeth Intact   Pulmonary neg pulmonary ROS,    Pulmonary exam normal breath sounds clear to auscultation       Cardiovascular hypertension, Pt. on medications and Pt. on home beta blockers negative cardio ROS Normal cardiovascular exam Rhythm:regular Rate:Normal     Neuro/Psych negative neurological ROS  negative psych ROS   GI/Hepatic negative GI ROS, Neg liver ROS,   Endo/Other  Morbid obesity  Renal/GU Renal InsufficiencyRenal disease  negative genitourinary   Musculoskeletal negative musculoskeletal ROS (+)   Abdominal (+) + obese,   Peds  Hematology negative hematology ROS (+)   Anesthesia Other Findings   Reproductive/Obstetrics (+) Pregnancy                             Anesthesia Physical Anesthesia Plan  ASA: III  Anesthesia Plan: Epidural   Post-op Pain Management:    Induction:   PONV Risk Score and Plan:   Airway Management Planned:   Additional Equipment:   Intra-op Plan:   Post-operative Plan:   Informed Consent: I have reviewed the patients History and Physical, chart, labs and discussed the procedure including the risks, benefits and alternatives for the proposed anesthesia with the patient or authorized representative who has indicated his/her understanding and acceptance.       Plan Discussed with:   Anesthesia Plan Comments:         Anesthesia Quick Evaluation

## 2019-06-29 ENCOUNTER — Encounter (HOSPITAL_COMMUNITY): Payer: Self-pay | Admitting: Obstetrics

## 2019-06-29 DIAGNOSIS — O1424 HELLP syndrome, complicating childbirth: Secondary | ICD-10-CM | POA: Diagnosis not present

## 2019-06-29 DIAGNOSIS — Z3A32 32 weeks gestation of pregnancy: Secondary | ICD-10-CM | POA: Diagnosis not present

## 2019-06-29 DIAGNOSIS — E1121 Type 2 diabetes mellitus with diabetic nephropathy: Secondary | ICD-10-CM | POA: Diagnosis present

## 2019-06-29 DIAGNOSIS — N179 Acute kidney failure, unspecified: Secondary | ICD-10-CM | POA: Diagnosis present

## 2019-06-29 DIAGNOSIS — R809 Proteinuria, unspecified: Secondary | ICD-10-CM | POA: Diagnosis present

## 2019-06-29 DIAGNOSIS — O24419 Gestational diabetes mellitus in pregnancy, unspecified control: Secondary | ICD-10-CM | POA: Diagnosis present

## 2019-06-29 DIAGNOSIS — E1165 Type 2 diabetes mellitus with hyperglycemia: Secondary | ICD-10-CM | POA: Diagnosis present

## 2019-06-29 DIAGNOSIS — N189 Chronic kidney disease, unspecified: Secondary | ICD-10-CM | POA: Diagnosis present

## 2019-06-29 LAB — GLUCOSE, CAPILLARY
Glucose-Capillary: 103 mg/dL — ABNORMAL HIGH (ref 70–99)
Glucose-Capillary: 103 mg/dL — ABNORMAL HIGH (ref 70–99)
Glucose-Capillary: 106 mg/dL — ABNORMAL HIGH (ref 70–99)
Glucose-Capillary: 107 mg/dL — ABNORMAL HIGH (ref 70–99)
Glucose-Capillary: 110 mg/dL — ABNORMAL HIGH (ref 70–99)
Glucose-Capillary: 110 mg/dL — ABNORMAL HIGH (ref 70–99)
Glucose-Capillary: 111 mg/dL — ABNORMAL HIGH (ref 70–99)
Glucose-Capillary: 111 mg/dL — ABNORMAL HIGH (ref 70–99)
Glucose-Capillary: 113 mg/dL — ABNORMAL HIGH (ref 70–99)
Glucose-Capillary: 115 mg/dL — ABNORMAL HIGH (ref 70–99)
Glucose-Capillary: 128 mg/dL — ABNORMAL HIGH (ref 70–99)
Glucose-Capillary: 71 mg/dL (ref 70–99)
Glucose-Capillary: 72 mg/dL (ref 70–99)
Glucose-Capillary: 84 mg/dL (ref 70–99)
Glucose-Capillary: 86 mg/dL (ref 70–99)
Glucose-Capillary: 98 mg/dL (ref 70–99)
Glucose-Capillary: 98 mg/dL (ref 70–99)

## 2019-06-29 LAB — COMPREHENSIVE METABOLIC PANEL
ALT: 52 U/L — ABNORMAL HIGH (ref 0–44)
ALT: 51 U/L — ABNORMAL HIGH (ref 0–44)
ALT: 44 U/L (ref 0–44)
AST: 36 U/L (ref 15–41)
AST: 33 U/L (ref 15–41)
AST: 29 U/L (ref 15–41)
Albumin: 2.2 g/dL — ABNORMAL LOW (ref 3.5–5.0)
Albumin: 2.1 g/dL — ABNORMAL LOW (ref 3.5–5.0)
Albumin: 2 g/dL — ABNORMAL LOW (ref 3.5–5.0)
Alkaline Phosphatase: 121 U/L (ref 38–126)
Alkaline Phosphatase: 122 U/L (ref 38–126)
Alkaline Phosphatase: 123 U/L (ref 38–126)
Anion gap: 9 (ref 5–15)
Anion gap: 11 (ref 5–15)
Anion gap: 10 (ref 5–15)
BUN: 47 mg/dL — ABNORMAL HIGH (ref 6–20)
BUN: 44 mg/dL — ABNORMAL HIGH (ref 6–20)
BUN: 38 mg/dL — ABNORMAL HIGH (ref 6–20)
CO2: 21 mmol/L — ABNORMAL LOW (ref 22–32)
CO2: 20 mmol/L — ABNORMAL LOW (ref 22–32)
CO2: 21 mmol/L — ABNORMAL LOW (ref 22–32)
Calcium: 8.1 mg/dL — ABNORMAL LOW (ref 8.9–10.3)
Calcium: 7.6 mg/dL — ABNORMAL LOW (ref 8.9–10.3)
Calcium: 7.6 mg/dL — ABNORMAL LOW (ref 8.9–10.3)
Chloride: 104 mmol/L (ref 98–111)
Chloride: 101 mmol/L (ref 98–111)
Chloride: 101 mmol/L (ref 98–111)
Creatinine, Ser: 2.38 mg/dL — ABNORMAL HIGH (ref 0.44–1.00)
Creatinine, Ser: 2.34 mg/dL — ABNORMAL HIGH (ref 0.44–1.00)
Creatinine, Ser: 2.32 mg/dL — ABNORMAL HIGH (ref 0.44–1.00)
GFR calc Af Amer: 31 mL/min — ABNORMAL LOW (ref >60)
GFR calc Af Amer: 31 mL/min — ABNORMAL LOW (ref >60)
GFR calc Af Amer: 32 mL/min — ABNORMAL LOW (ref >60)
GFR calc non Af Amer: 26 mL/min — ABNORMAL LOW (ref >60)
GFR calc non Af Amer: 27 mL/min — ABNORMAL LOW (ref >60)
GFR calc non Af Amer: 27 mL/min — ABNORMAL LOW (ref >60)
Glucose, Bld: 73 mg/dL (ref 70–99)
Glucose, Bld: 102 mg/dL — ABNORMAL HIGH (ref 70–99)
Glucose, Bld: 115 mg/dL — ABNORMAL HIGH (ref 70–99)
Potassium: 4.7 mmol/L (ref 3.5–5.1)
Potassium: 4.7 mmol/L (ref 3.5–5.1)
Potassium: 4.7 mmol/L (ref 3.5–5.1)
Sodium: 134 mmol/L — ABNORMAL LOW (ref 135–145)
Sodium: 132 mmol/L — ABNORMAL LOW (ref 135–145)
Sodium: 132 mmol/L — ABNORMAL LOW (ref 135–145)
Total Bilirubin: <0.1 mg/dL — ABNORMAL LOW (ref 0.3–1.2)
Total Bilirubin: 0.2 mg/dL — ABNORMAL LOW (ref 0.3–1.2)
Total Bilirubin: 0.2 mg/dL — ABNORMAL LOW (ref 0.3–1.2)
Total Protein: 5.6 g/dL — ABNORMAL LOW (ref 6.5–8.1)
Total Protein: 5.6 g/dL — ABNORMAL LOW (ref 6.5–8.1)
Total Protein: 5.4 g/dL — ABNORMAL LOW (ref 6.5–8.1)

## 2019-06-29 LAB — CBC
HCT: 38.2 % (ref 36.0–46.0)
HCT: 38.5 % (ref 36.0–46.0)
HCT: 40.2 % (ref 36.0–46.0)
HCT: 40.3 % (ref 36.0–46.0)
Hemoglobin: 12.4 g/dL (ref 12.0–15.0)
Hemoglobin: 12.9 g/dL (ref 12.0–15.0)
Hemoglobin: 12.9 g/dL (ref 12.0–15.0)
Hemoglobin: 13 g/dL (ref 12.0–15.0)
MCH: 26.8 pg (ref 26.0–34.0)
MCH: 26.9 pg (ref 26.0–34.0)
MCH: 27 pg (ref 26.0–34.0)
MCH: 28.2 pg (ref 26.0–34.0)
MCHC: 32 g/dL (ref 30.0–36.0)
MCHC: 32.2 g/dL (ref 30.0–36.0)
MCHC: 32.3 g/dL (ref 30.0–36.0)
MCHC: 33.8 g/dL (ref 30.0–36.0)
MCV: 83.4 fL (ref 80.0–100.0)
MCV: 83.4 fL (ref 80.0–100.0)
MCV: 83.5 fL (ref 80.0–100.0)
MCV: 83.8 fL (ref 80.0–100.0)
Platelets: 211 10*3/uL (ref 150–400)
Platelets: 242 10*3/uL (ref 150–400)
Platelets: 248 10*3/uL (ref 150–400)
Platelets: 248 10*3/uL (ref 150–400)
RBC: 4.58 MIL/uL (ref 3.87–5.11)
RBC: 4.61 MIL/uL (ref 3.87–5.11)
RBC: 4.81 MIL/uL (ref 3.87–5.11)
RBC: 4.82 MIL/uL (ref 3.87–5.11)
RDW: 16.8 % — ABNORMAL HIGH (ref 11.5–15.5)
RDW: 17 % — ABNORMAL HIGH (ref 11.5–15.5)
RDW: 17.1 % — ABNORMAL HIGH (ref 11.5–15.5)
RDW: 17.2 % — ABNORMAL HIGH (ref 11.5–15.5)
WBC: 10.4 10*3/uL (ref 4.0–10.5)
WBC: 10.6 10*3/uL — ABNORMAL HIGH (ref 4.0–10.5)
WBC: 10.7 10*3/uL — ABNORMAL HIGH (ref 4.0–10.5)
WBC: 12.9 10*3/uL — ABNORMAL HIGH (ref 4.0–10.5)
nRBC: 0 % (ref 0.0–0.2)
nRBC: 0 % (ref 0.0–0.2)
nRBC: 0 % (ref 0.0–0.2)
nRBC: 0 % (ref 0.0–0.2)

## 2019-06-29 LAB — PROTEIN / CREATININE RATIO, URINE
Creatinine, Urine: 22.76 mg/dL
Protein Creatinine Ratio: 2.86 mg/mg{Cre} — ABNORMAL HIGH (ref 0.00–0.15)
Total Protein, Urine: 65 mg/dL

## 2019-06-29 LAB — FANA STAINING PATTERNS: Speckled Pattern: 1:320 {titer} — ABNORMAL HIGH

## 2019-06-29 LAB — BASIC METABOLIC PANEL
Anion gap: 9 (ref 5–15)
BUN: 36 mg/dL — ABNORMAL HIGH (ref 6–20)
CO2: 21 mmol/L — ABNORMAL LOW (ref 22–32)
Calcium: 7.2 mg/dL — ABNORMAL LOW (ref 8.9–10.3)
Chloride: 100 mmol/L (ref 98–111)
Creatinine, Ser: 2.26 mg/dL — ABNORMAL HIGH (ref 0.44–1.00)
GFR calc Af Amer: 33 mL/min — ABNORMAL LOW (ref >60)
GFR calc non Af Amer: 28 mL/min — ABNORMAL LOW (ref >60)
Glucose, Bld: 120 mg/dL — ABNORMAL HIGH (ref 70–99)
Potassium: 5 mmol/L (ref 3.5–5.1)
Sodium: 130 mmol/L — ABNORMAL LOW (ref 135–145)

## 2019-06-29 LAB — MAGNESIUM
Magnesium: 6.1 mg/dL (ref 1.7–2.4)
Magnesium: 6.7 mg/dL (ref 1.7–2.4)
Magnesium: 8.7 mg/dL (ref 1.7–2.4)
Magnesium: 6.6 mg/dL (ref 1.7–2.4)
Magnesium: 6 mg/dL — ABNORMAL HIGH (ref 1.7–2.4)

## 2019-06-29 LAB — ANTINUCLEAR ANTIBODIES, IFA: ANA Ab, IFA: POSITIVE — AB

## 2019-06-29 LAB — RUBELLA SCREEN: Rubella: 6.37 index (ref >0.99)

## 2019-06-29 MED ORDER — HYDRALAZINE HCL 20 MG/ML IJ SOLN
5.0000 mg | INTRAMUSCULAR | Status: DC | PRN
  Administered 2019-06-29 – 2019-07-04 (×9): 5 mg via INTRAVENOUS
  Filled 2019-06-29 (×7): qty 1

## 2019-06-29 MED ORDER — TERBUTALINE SULFATE 1 MG/ML IJ SOLN: 0.2500 mg | Freq: Once | INTRAMUSCULAR | Status: AC

## 2019-06-29 MED ORDER — WITCH HAZEL-GLYCERIN EX PADS: 1.0000 "application " | MEDICATED_PAD | CUTANEOUS | Status: DC | PRN

## 2019-06-29 MED ORDER — LABETALOL HCL 5 MG/ML IV SOLN
20.0000 mg | INTRAVENOUS | Status: DC | PRN
  Administered 2019-06-30 – 2019-07-02 (×4): 20 mg via INTRAVENOUS

## 2019-06-29 MED ORDER — ONDANSETRON HCL 4 MG PO TABS: 4.0000 mg | ORAL_TABLET | ORAL | Status: DC | PRN

## 2019-06-29 MED ORDER — EPHEDRINE 5 MG/ML INJ
5.0000 mg | INTRAVENOUS | Status: DC | PRN
  Administered 2019-06-29 (×2): 5 mg via INTRAVENOUS

## 2019-06-29 MED ORDER — PRENATAL MULTIVITAMIN CH
1.0000 | ORAL_TABLET | Freq: Every day | ORAL | Status: DC
  Administered 2019-07-01 – 2019-07-04 (×4): 1 via ORAL
  Filled 2019-06-29 (×4): qty 1

## 2019-06-29 MED ORDER — LABETALOL HCL 5 MG/ML IV SOLN
40.0000 mg | INTRAVENOUS | Status: DC | PRN
  Administered 2019-07-02 (×2): 40 mg via INTRAVENOUS

## 2019-06-29 MED ORDER — MISOPROSTOL 200 MCG PO TABS
ORAL_TABLET | ORAL | Status: AC
  Administered 2019-06-29: 16:00:00 800 ug via RECTAL
  Filled 2019-06-29: qty 4

## 2019-06-29 MED ORDER — SENNOSIDES-DOCUSATE SODIUM 8.6-50 MG PO TABS
2.0000 | ORAL_TABLET | ORAL | Status: DC
  Administered 2019-06-29 – 2019-07-03 (×4): 2 via ORAL
  Filled 2019-06-29 (×5): qty 2

## 2019-06-29 MED ORDER — OXYCODONE HCL 5 MG PO TABS
5.0000 mg | ORAL_TABLET | Freq: Four times a day (QID) | ORAL | Status: DC | PRN
  Administered 2019-06-29 – 2019-07-01 (×2): 5 mg via ORAL
  Filled 2019-06-29 (×2): qty 1

## 2019-06-29 MED ORDER — HYDRALAZINE HCL 20 MG/ML IJ SOLN
10.0000 mg | INTRAMUSCULAR | Status: DC | PRN
  Administered 2019-06-30: 04:00:00 10 mg via INTRAVENOUS
  Filled 2019-06-29: qty 1

## 2019-06-29 MED ORDER — IBUPROFEN 600 MG PO TABS: 600.0000 mg | ORAL_TABLET | Freq: Three times a day (TID) | ORAL | Status: DC | PRN

## 2019-06-29 MED ORDER — LACTATED RINGERS IV BOLUS
250.0000 mL | Freq: Once | INTRAVENOUS | Status: AC
  Administered 2019-06-29: 250 mL via INTRAVENOUS

## 2019-06-29 MED ORDER — INSULIN REGULAR(HUMAN) IN NACL 100-0.9 UT/100ML-% IV SOLN
INTRAVENOUS | Status: DC
  Administered 2019-06-29: 0.2 [IU]/h via INTRAVENOUS
  Filled 2019-06-29: qty 100

## 2019-06-29 MED ORDER — OXYTOCIN 40 UNITS IN NORMAL SALINE INFUSION - SIMPLE MED
2.5000 [IU]/h | INTRAVENOUS | Status: DC
  Administered 2019-06-29: 16:00:00 2.5 [IU]/h via INTRAVENOUS

## 2019-06-29 MED ORDER — DIBUCAINE (PERIANAL) 1 % EX OINT: 1.0000 "application " | TOPICAL_OINTMENT | CUTANEOUS | Status: DC | PRN

## 2019-06-29 MED ORDER — ONDANSETRON HCL 4 MG/2ML IJ SOLN: 4.0000 mg | INTRAMUSCULAR | Status: DC | PRN

## 2019-06-29 MED ORDER — MEASLES, MUMPS & RUBELLA VAC IJ SOLR: 0.5000 mL | Freq: Once | INTRAMUSCULAR | Status: DC

## 2019-06-29 MED ORDER — AMLODIPINE BESYLATE 10 MG PO TABS
10.0000 mg | ORAL_TABLET | Freq: Every day | ORAL | Status: DC
  Administered 2019-06-29 – 2019-07-02 (×4): 10 mg via ORAL
  Filled 2019-06-29 (×4): qty 1

## 2019-06-29 MED ORDER — SODIUM CHLORIDE 0.9 % IV SOLN
2.0000 g | Freq: Three times a day (TID) | INTRAVENOUS | Status: DC
  Administered 2019-06-29: 2 g via INTRAVENOUS
  Filled 2019-06-29: qty 2000

## 2019-06-29 MED ORDER — COCONUT OIL OIL: 1.0000 "application " | TOPICAL_OIL | Status: DC | PRN

## 2019-06-29 MED ORDER — OXYTOCIN 40 UNITS IN NORMAL SALINE INFUSION - SIMPLE MED: 1.0000 m[IU]/min | INTRAVENOUS | Status: DC

## 2019-06-29 MED ORDER — SIMETHICONE 80 MG PO CHEW: 80.0000 mg | CHEWABLE_TABLET | ORAL | Status: DC | PRN

## 2019-06-29 MED ORDER — BENZOCAINE-MENTHOL 20-0.5 % EX AERO
1.0000 "application " | INHALATION_SPRAY | CUTANEOUS | Status: DC | PRN
  Administered 2019-07-01: 1 via TOPICAL
  Filled 2019-06-29: qty 56

## 2019-06-29 MED ORDER — MAGNESIUM SULFATE 40 G IN LACTATED RINGERS - SIMPLE
0.5000 g/h | INTRAVENOUS | Status: AC
  Administered 2019-06-29: 18:00:00 0.5 g/h via INTRAVENOUS

## 2019-06-29 MED ORDER — DIPHENHYDRAMINE HCL 25 MG PO CAPS: 25.0000 mg | ORAL_CAPSULE | Freq: Four times a day (QID) | ORAL | Status: DC | PRN

## 2019-06-29 MED ORDER — ACETAMINOPHEN 325 MG PO TABS
650.0000 mg | ORAL_TABLET | Freq: Four times a day (QID) | ORAL | Status: DC | PRN
  Administered 2019-07-02 (×2): 650 mg via ORAL
  Filled 2019-06-29 (×2): qty 2

## 2019-06-29 MED ORDER — MISOPROSTOL 200 MCG PO TABS
800.0000 ug | ORAL_TABLET | Freq: Once | ORAL | Status: AC
  Administered 2019-06-29: 16:00:00 800 ug via RECTAL

## 2019-06-29 MED ORDER — TETANUS-DIPHTH-ACELL PERTUSSIS 5-2.5-18.5 LF-MCG/0.5 IM SUSP: 0.5000 mL | Freq: Once | INTRAMUSCULAR | Status: DC

## 2019-06-29 MED ORDER — OXYTOCIN 40 UNITS IN NORMAL SALINE INFUSION - SIMPLE MED
1.0000 m[IU]/min | INTRAVENOUS | Status: DC
  Administered 2019-06-29: 11:00:00 1 m[IU]/min via INTRAVENOUS

## 2019-06-29 MED ORDER — ENOXAPARIN SODIUM 40 MG/0.4ML ~~LOC~~ SOLN: 40.0000 mg | SUBCUTANEOUS | Status: DC

## 2019-06-29 MED ORDER — ENOXAPARIN SODIUM 40 MG/0.4ML ~~LOC~~ SOLN
40.0000 mg | SUBCUTANEOUS | Status: DC
  Administered 2019-06-30 – 2019-07-05 (×6): 40 mg via SUBCUTANEOUS
  Filled 2019-06-29 (×6): qty 0.4

## 2019-06-29 NOTE — Progress Notes (Addendum)
LABOR PROGRESS NOTE  Renee Porter is a 33 y.o. [id number] at [redacted]w[redacted]d  admitted for IOL for severe PreE and partial HELLP syndrome.   Subjective: Overall comfortable. Feeling some sensation of needing to go to the bathroom.  Objective: BP (!) 162/91   Pulse 77   Temp (!) 97.4 F (36.3 C) (Oral)   Resp 16   Ht 4\' 8"  (1.422 m)   Wt 83.8 kg   LMP  (LMP Unknown)   SpO2 100%   BMI 41.42 kg/m  or  Vitals:   06/29/19 1411 06/29/19 1431 06/29/19 1506 06/29/19 1532  BP:  129/78 (!) 158/78 (!) 162/91  Pulse:  66 68 77  Resp:   16   Temp: (!) 97.4 F (36.3 C)     TempSrc: Oral     SpO2:  99% 100%   Weight:      Height:       Gen: comfortable CV: Regular rate Pulm: Normal effort Neuro: reflexes 2+  Dilation: 8 Effacement (%): 100 Cervical Position: Anterior Station: 0 Presentation: Vertex by exam  Exam by: Aireona Torelli FHT: baseline rate 110, minimal to moderate varibility, -acel, variables present, some early decelerations, previously with late decels but none currently  Toco: q5 min  Labs: Lab Results  Component Value Date   WBC 10.7 (H) 06/29/2019   HGB 12.4 06/29/2019   HCT 38.5 06/29/2019   MCV 83.5 06/29/2019   PLT 248 06/29/2019    Patient Active Problem List   Diagnosis Date Noted  . Preeclampsia, third trimester 06/24/2019  . Supervision of high risk pregnancy, antepartum 06/22/2019  . Chronic hypertension affecting pregnancy 06/22/2019  . Hypertension 07/20/2018  . SAB (spontaneous abortion) 07/20/2018  . Kidney disease 07/20/2018    Assessment / Plan: 33 y.o. [id number] at [redacted]w[redacted]d here for IOL for severe PreE and partial HELLP.  Labor: Progressing well. TOLAC with prior VBAC. FB placed at [place] and out at 0430. On pitocin since 1632 on 9/21 and turned off this morning at 0700 for recurrent lates. AROM with light mec at 1020. Pit restarted at 1030; IUPC in place. Forebag ruptured at this exam. Anticipate SVD. S/p 250 mL bolus for minimal variability. Watching I/O's  carefully.  GBS: unknown, on ampicillin Fetal Wellbeing:  Currently Cat I/II Pain Control:  Epidural Anticipated MOD:  SVD PreE/Partial HELLP: Mag level 8.7 at 1200. Recheck at 1800 and restart as indicated. Reflexes WNL. BP meds discontinued; currently with severe range BP and will treat with Labetalol. Platelets stable at 248. Cr stable at 2.32. LFT's WNL. Unclear etiology for CKD per Renal. No absolute indication for dialysis currently; lytes stable and UOP adequate.  GDM: Glucostabilizer. Insulin current off. Last glucose 113. CKD: Baseline Cr 1.7-1.9, currently 2.3, watching fluid status closely, no signs of volume overload at present.  [name], MD [facility] Fellow, [facility] for [facility], [facility] 06/29/2019, 3:48 PM

## 2019-06-29 NOTE — Progress Notes (Signed)
CRITICAL VALUE ALERT  Critical Value:  Magnesium 8.7  Date & Time Notied:  06/29/2019 1320   Provider Notified: Dr. Marice Potter  Orders Received/Actions taken: Mag paused, repeat Mag level per orders

## 2019-06-29 NOTE — Progress Notes (Signed)
[facility] Progress Note    Assessment/ Plan:   33 year old female in third trimester of pregnancy presenting with uncontrolled hypertension, chronic renal insufficiency, nephrotic range proteinuria and hypoalbuminemia.   1. CKD 3 with nephrotic range proteinuria, predating pregnancy, unclear etiology:Baseline Cr appears to be 1.7-1.9. Reports a history of lupus in her half-sister. UA bland without RBCs or WBCs.ANA positive, anti-SM ab negative, SSA/ SSBnegative, ENA negative.Complements WNL.HIV, hep serologies negative, RPR negative. - Her Cr has risen from 1.9-->2.67 and blood pressures are still in the severe range. Her BUN has steadily increased w/ significant proteinuria (repeat UP/C 2 g) ->  24 hr urine 2.27 gm.  She has AKI in pregnancy (etiology as yet undetermined; does not appear to be GN related) which could result in permanent advancement of CKD and/or progression to ESRD -> Her renal function has finally started to  equilibrate but still w/ signif dysfunction -> TOLAC.   Discussed also that there is no absolute indication for dialysis but will evaluate daily. BP ok this AM.   2. Chronic HTN withlikelysuperimposed pre-eclampsia:BP still not at goal.uptitratedlabetalol to 600 TID 9/19, procardia XL 30 9/19, increased to 60 mg 9/20.May need to continue the anti-hypertensives even after delivery as there is a variable time interval response.  3. Third trimester pregnancy: 32 weeks by [place] 06/24/2019. S/p 2 doses BMZ  4. Gestational DM: on insulin  5.Dispo: remains inpt. I appreciate MFM and OB colleagues with coordination of care.   Subjective:   Denies f/c/n/v/dyspnea/HA/ visual disturbances   Objective:   BP 138/88   Pulse 65   Temp 98.2 F (36.8 C) (Axillary)   Resp 17   Ht 4\' 8"  (1.422 m)   Wt 83.8 kg   LMP  (LMP Unknown)   SpO2 99%   BMI 41.42 kg/m   Intake/Output Summary (Last 24 hours) at 06/29/2019  [id number] Last data filed at 06/29/2019 0900 Gross per 24 hour  Intake 5762.35 ml  Output 5150 ml  Net 612.35 ml   Weight change: 0.34 kg  Physical Exam: Gen:NAD,sitting in bed w/ signif other bedside HEENT: no JVD, sclerae anicteric CVS: RRR no m/r/g Resp: clear bilaterally no c/w/r Abd: + gravid uterus Ext: trace L LE edema and RLE edema  Imaging: US Renal  Result Date: 06/27/2019 CLINICAL DATA:  Acute Kidney Injury EXAM: RENAL / URINARY TRACT ULTRASOUND COMPLETE COMPARISON:  None. FINDINGS: Right Kidney: Renal measurements: 9 x 3.4 x 4.3 cm = volume: 65.6 mL . Echogenicity within normal limits. No mass or hydronephrosis visualized. Left Kidney: Renal measurements: 10 x 4.8 x 4 cm = volume: 100 mL. Echogenicity within normal limits. No mass or hydronephrosis visualized. Bladder: Appears normal for degree of bladder distention. Other: Increased hepatic parenchymal echogenicity. IMPRESSION: 1.  Normal renal ultrasound. 2. Increased hepatic parenchymal echogenicity as can be seen with hepatic steatosis. Electronically Signed   By: Kathreen Devoid   On: 06/27/2019 13:04    Labs: BMET Recent Labs  Lab 06/26/19 1345 06/27/19 [id number] 06/28/19 0545 06/28/19 1219 06/28/19 1748 06/29/19 0003 06/29/19 0633  NA 132* 134* 135 134* 134* 134* 132*  K 4.6 4.8 4.8 4.7 4.5 4.7 4.7  CL 104 104 106 106 105 104 101  CO2 18* 17* 19* 19* 20* 21* 20*  GLUCOSE 99 106* 73 118* 74 73 102*  BUN 49* 54* 58* 55* 50* 47* 44*  CREATININE 2.57* 2.57* 2.67* 2.51* 2.39* 2.38* 2.34*  CALCIUM 8.9 8.6* 8.6* 8.5* 8.6* 8.1* 7.6*   CBC  Recent Labs  Lab 06/27/19 1129 06/28/19 0545 06/28/19 1219 06/28/19 1748 06/29/19 0003 06/29/19 0633  WBC  --  7.2 7.0 7.3 10.4 10.6*  NEUTROABS 5.8 5.4  --   --   --   --   HGB  --  12.7 12.0 13.5 13.0 12.9  HCT  --  39.9 37.4 40.8 40.2 40.3  MCV  --  84.5 84.2 83.6 83.4 83.8  PLT  --  259 251 257 248 242    Medications:    . docusate sodium  100 mg Oral Daily  .  prenatal multivitamin  1 tablet Oral Q1200      Otelia Santee, MD 06/29/2019, 9:39 AM

## 2019-06-29 NOTE — Progress Notes (Signed)
LABOR PROGRESS NOTE  Renee Porter is a 34 y.o. [id number] at [redacted]w[redacted]d  admitted for IOL for severe PreE and partial HELLP syndrome. .  Subjective: Feeling comfortable since getting epidural FB still in place  Objective: BP (!) 153/89   Pulse 76   Temp 97.8 F (36.6 C) (Oral)   Resp 16   Ht 4\' 8"  (1.422 m)   Wt 83.8 kg   LMP  (LMP Unknown)   SpO2 98%   BMI 41.42 kg/m  or  Vitals:   06/28/19 2330 06/29/19 0000 06/29/19 0030 06/29/19 0104  BP: (!) 143/83 (!) 156/96 (!) 149/90 (!) 153/89  Pulse: 77 79 74 76  Resp:  16  16  Temp:  97.8 F (36.6 C)    TempSrc:  Oral    SpO2: 98% 100% 98%   Weight:      Height:       Gen: comfortable CV: RRR Pulm: CTABL Neuro: reflexes 2+   Dilation: Fingertip Effacement (%): Thick Station: Ballotable Presentation: Vertex Exam by:: Ignacia Felling, RN FHT: baseline rate 125, moderate varibility, -acel, -decel Toco: uterine irritability but very difficult to trace  Labs: Lab Results  Component Value Date   WBC 10.4 06/29/2019   HGB 13.0 06/29/2019   HCT 40.2 06/29/2019   MCV 83.4 06/29/2019   PLT 248 06/29/2019    Patient Active Problem List   Diagnosis Date Noted  . Preeclampsia, third trimester 06/24/2019  . Supervision of high risk pregnancy, antepartum 06/22/2019  . Chronic hypertension affecting pregnancy 06/22/2019  . Hypertension 07/20/2018  . SAB (spontaneous abortion) 07/20/2018  . Kidney disease 07/20/2018    Assessment / Plan: 33 y.o. 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  Labor: TOLAC with prior VBAC. FB placed at [place], on pitocin since 1632 and cont to uptitrate. GBS: unknown, on ampicillin Fetal Wellbeing:  Currently Cat I but has frequent periods of minimal variability, likely secondary to Mg. Shortly after epidural also with period of minimal variability and some subtle decels, unclear what type due to difficulty tracing contractions. At that time also with low normal BP's that were  below her baseline, since allowed to be slightly higher with improved tracing. Pain Control:  Epidural Anticipated MOD:  SVD PreE/Partial HELLP: repeat labs at midnight stable from prior, Mg level at goal, cont to monitor labs q6h. Cont Mg at 1g/hr rate. Currently on Nifedipine 60mg  qdaily and Labetalol 600mg  TID, however did not receive afternoon dose of labetalol (unclear why) and 2200 dose held due to acceptable pressures following epidural placement. Will cont to monitor, may need to be downtitrated to BID. GDM: elevated glucoses this AM, followed by hypoglycemia in afternoon. Glucostabilizer. CKD: Baseline Cr 1.7-1.9, currently 2.3, watching fluid status closely, no signs of volume overload at present.  Augustin Coupe, MD/MPH OB Fellow  06/29/2019, 1:53 AM

## 2019-06-29 NOTE — Progress Notes (Signed)
LABOR PROGRESS NOTE  Renee Porter is a 33 y.o. [id number] at [redacted]w[redacted]d  admitted for IOL for severe PreE and partial HELLP syndrome. .  Subjective: Feeling comfortable FB came out at 0430  Objective: BP 130/83   Pulse 68   Temp (!) 97.5 F (36.4 C) (Oral)   Resp 18   Ht 4\' 8"  (1.422 m)   Wt 83.8 kg   LMP  (LMP Unknown)   SpO2 99%   BMI 41.42 kg/m  or  Vitals:   06/29/19 0430 06/29/19 0500 06/29/19 0537 06/29/19 0602  BP: 138/71 (!) 146/78 (!) 158/90 130/83  Pulse: 74 70 79 68  Resp:  18    Temp:      TempSrc:      SpO2:      Weight:      Height:       Gen: comfortable CV: RRR Pulm: CTABL Neuro: reflexes 2+   Dilation: 3.5 Effacement (%): 70 Cervical Position: Anterior Station: -3 Presentation: Vertex Exam by:: Sheryle Hail, RN FHT: baseline rate 120, minimal varibility, -acel, -decel Toco: q1-3 min  Labs: Lab Results  Component Value Date   WBC 10.4 06/29/2019   HGB 13.0 06/29/2019   HCT 40.2 06/29/2019   MCV 83.4 06/29/2019   PLT 248 06/29/2019    Patient Active Problem List   Diagnosis Date Noted  . Preeclampsia, third trimester 06/24/2019  . Supervision of high risk pregnancy, antepartum 06/22/2019  . Chronic hypertension affecting pregnancy 06/22/2019  . Hypertension 07/20/2018  . SAB (spontaneous abortion) 07/20/2018  . Kidney disease 07/20/2018    Assessment / Plan: 33 y.o. 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  Labor: TOLAC with prior VBAC. FB placed at [place], out at 0430. On pitocin since 1632 and cont to uptitrate. GBS: unknown, on ampicillin Fetal Wellbeing:  Currently Cat II for minimal variability, suspect secondary to Mg or sleep cycle, intermittent periods of Cat I. Pain Control:  Epidural Anticipated MOD:  SVD PreE/Partial HELLP: repeat labs at midnight stable from prior, Mg level at goal, repeat labs for 0600 are pending. Cont Mg at 1g/hr rate. Currently on Nifedipine 60mg  qdaily and Labetalol 600mg  TID,  however did not receive afternoon dose of labetalol (unclear why) and 2200 dose held due to acceptable pressures following epidural placement. Will cont to monitor, may need to be downtitrated to BID. GDM: elevated glucoses this AM, followed by hypoglycemia in afternoon. Glucostabilizer. CKD: Baseline Cr 1.7-1.9, currently 2.3, watching fluid status closely, no signs of volume overload at present.  Augustin Coupe, MD/MPH OB Fellow  06/29/2019, 6:11 AM

## 2019-06-29 NOTE — Discharge Summary (Signed)
Postpartum Discharge Summary     Patient Name: Renee Porter DOB: Aug 14, 1986 MRN: [id number]  Date of admission: 06/24/2019 Delivering Provider: Chauncey Mann   Date of discharge: 07/05/2019  Admitting diagnosis: High BP Intrauterine pregnancy: [redacted]w[redacted]d    Secondary diagnosis:  Active Problems:   Supervision of high risk pregnancy, antepartum   Chronic hypertension affecting pregnancy   Preeclampsia, third trimester   CKD (chronic kidney disease)   [place]) [place]   Nephrotic range proteinuria   Gestational diabetes   Chronic hypertension with superimposed pre-eclampsia  Additional problems: None     Discharge diagnosis: Preterm Pregnancy Delivered, VBAC, CHTN with superimposed preeclampsia and GDM A2                                                                                                Post partum procedures:magnesium  Augmentation: AROM, Pitocin and Foley Balloon  Complications: None  Hospital course:  Induction of Labor With Vaginal Delivery   33y.o. yo [id number] admitted to the hospital 06/24/2019 for induction of labor.  Indication for induction: Preeclampsia, A2 DM and partial HELLP. Patient presented to [facility] with no prenatal care and found to have preeclampsia with worsening chronic kidney disease. [facility] and started on Magnesium, received 2 doses of Betamethasone and blood pressure treatment initiated. Nephrology consulted with unclear etiology of renal disease and would like to pursue renal biopsy in postpartum period. With assistance of MFM, decision made to start induction. Patient given Pitocin, Foley bulb placed and AROM performed. Received Epidural. Glucose stabilizer initiated but eventually turned off due to well-controlled sugars. BP's labile and patient received PRN Labetalol. She eventually progressed to completed and had uncomplicated delivery.  Membrane Rupture Time/Date: 10:25 AM  ,06/29/2019   Intrapartum Procedures: Episiotomy: None [1]                                         Lacerations:  None [1]  Patient had delivery of a Viable infant.  Information for the patient's newborn:  Ramos-Gregorio, Boy EvShivani0[[id number]]Delivery Method: VBAC, Spontaneous(Filed from Delivery Summary)    06/29/2019  Details of delivery can be found in separate delivery note. Patient had successful VBAC. She received 24 hrs magnesium therapy. Her anti-hypertensive regimen was adjusted several times per renal. Postpartum, blood pressures, repeat lab work and glucose monitored carefully. Physically, she is doing very well and eager for discharge.  Cr now back to her baseline, BP improved with added labetalol.  She was discharged home on procardia 60 mg XL daily and labetalol 300 mg BID.  She will f/u with BP check in office in 4 days. [facility]. Patient is discharged home 07/05/19.  Interview done via SpPatent attorney Delivery time: 4:07 PM   Magnesium Sulfate received: Yes BMZ received: Yes Rhophylac:No MMR:No Transfusion:No  Physical exam  Vitals:   07/04/19 2345 07/05/19 0515 07/05/19 0819 07/05/19 0836  BP: 124/71 (!) 153/90 (!) 160/97 (!) 157/92  Pulse: 91 85 89 91  Resp: 16 18 18    Temp: 98.4 F (36.9 C) 98.2 F (36.8 C) 98.7 F (37.1 C)   TempSrc: Oral Oral Oral   SpO2: 97% 99% 99%   Weight:      Height:       General: alert, cooperative and no distress Lochia: appropriate Uterine Fundus: firm Incision: N/A DVT Evaluation: No evidence of DVT seen on physical exam. Labs: Lab Results  Component Value Date   WBC 9.2 07/04/2019   HGB 10.8 (L) 07/04/2019   HCT 33.6 (L) 07/04/2019   MCV 86.2 07/04/2019   PLT 319 07/04/2019   PLT 322 07/04/2019   CMP Latest Ref Rng & Units 07/05/2019  Glucose 70 - 99 mg/dL 106(H)  BUN 6 - 20 mg/dL 30(H)   Creatinine 0.44 - 1.00 mg/dL 1.93(H)  Sodium 135 - 145 mmol/L 140  Potassium 3.5 - 5.1 mmol/L 4.2  Chloride 98 - 111 mmol/L 108  CO2 22 - 32 mmol/L 22  Calcium 8.9 - 10.3 mg/dL 8.9  Total Protein 6.5 - 8.1 g/dL 6.3(L)  Total Bilirubin 0.3 - 1.2 mg/dL 0.4  Alkaline Phos 38 - 126 U/L 111  AST 15 - 41 U/L 29  ALT 0 - 44 U/L 59(H)    Discharge instruction: per After Visit Summary and "Baby and Me Booklet".  After visit meds:  Allergies as of 07/05/2019   No Known Allergies     Medication List    STOP taking these medications   acetaminophen 500 MG tablet Commonly known as: TYLENOL   traMADol 50 MG tablet Commonly known as: Ultram     TAKE these medications   labetalol 300 MG tablet Commonly known as: NORMODYNE Take 1 tablet (300 mg total) by mouth 2 (two) times daily. What changed:   medication strength  how much to take  when to take this  Another medication with the same name was removed. Continue taking this medication, and follow the directions you see here.   NIFEdipine 60 MG 24 hr tablet Commonly known as: Procardia XL Take 1 tablet (60 mg total) by mouth daily.   PRENATAL VITAMINS PO Take 1 tablet by mouth daily after lunch.       Diet: carb modified diet  Activity: Advance as tolerated. Pelvic rest for 6 weeks.   Outpatient follow up:4 weeks Follow up Appt: Future Appointments  Date Time Provider [place]  07/07/2019  1:50 PM [facility] [place] [facility] None  07/14/2019  8:30 AM [place] WOC  07/14/2019  8:50 AM [facility] LAB [facility] WOC  08/11/2019  9:35 AM [place] Jude, MD [facility] WOC   Follow up Visit: [place]. Go on 07/07/2019.   Why: 1:50pm Contact information: [address] [place] [address] [phone]       Department, [facility] Go on 07/20/2019.   Why: at 9:15 am for Nexplanon placement Contact  information: [place] [address] [phone]        Center for [facility]. Go on 07/09/2019.   Specialty: Obstetrics and Gynecology Contact information: [place] [address] [place], [place] [id number] [place] [address] [phone]       [place] Follow up.   Why: make appointment for 2 weeks Contact information: [address]  [place] [address] [phone]          Will request f[facility]u at [facility]. No PNC.  Please schedule this patient for Postpartum visit in: 4 weeks with the following provider: MD High risk pregnancy complicated by: cHTN with superimposed Pre-E, GDMA2, partial HELLP. Delivery mode:  VBAC Anticipated Birth Control:  other/unsure PP Procedures needed: BP check and GTT  Schedule Integrated BH visit: no   Newborn Data: Live born female  Birth Weight: 3 lb 11.3 oz (1680 g) APGAR (1 MIN): 7   APGAR (5 MINS): 8   APGAR (10 MINS):    Newborn Delivery   Birth date/time: 06/29/2019 16:07:00 Delivery type: VBAC, Spontaneous      Baby Feeding: Breast Disposition:NICU   07/05/2019 Sloan Leiter, MD

## 2019-06-29 NOTE — Progress Notes (Addendum)
LABOR PROGRESS NOTE  Renee Porter is a 33 y.o. [id number] at [redacted]w[redacted]d  admitted for IOL for severe PreE and partial HELLP syndrome.   Subjective: Continues to feel comfortable. Patient reports being thirsty.  Objective: BP 125/71   Pulse 67   Temp 97.7 F (36.5 C) (Oral)   Resp 16   Ht 4\' 8"  (1.422 m)   Wt 83.8 kg   LMP  (LMP Unknown)   SpO2 99%   BMI 41.42 kg/m  or  Vitals:   06/29/19 1130 06/29/19 1200 06/29/19 1231 06/29/19 1303  BP: 124/72 130/76 135/81 125/71  Pulse: 63 69 65 67  Resp: 17 16 16    Temp:      TempSrc:      SpO2: 99% 95% 99%   Weight:      Height:       Gen: comfortable CV: Regular rate Pulm: Normal effort Neuro: reflexes 2+  Dilation: 5 Effacement (%): 80 Cervical Position: Anterior Station: -1 Presentation: Vertex by exam  Exam by: [name] FHT: baseline rate 115, minimal to moderate varibility, -acel, -decel Toco: q5 min  Labs: Lab Results  Component Value Date   WBC 10.7 (H) 06/29/2019   HGB 12.4 06/29/2019   HCT 38.5 06/29/2019   MCV 83.5 06/29/2019   PLT 248 06/29/2019    Patient Active Problem List   Diagnosis Date Noted  . Preeclampsia, third trimester 06/24/2019  . Supervision of high risk pregnancy, antepartum 06/22/2019  . Chronic hypertension affecting pregnancy 06/22/2019  . Hypertension 07/20/2018  . SAB (spontaneous abortion) 07/20/2018  . Kidney disease 07/20/2018    Assessment / Plan: 33 y.o. 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  Labor: Progressing well. TOLAC with prior VBAC. FB placed at [name] and out at 0430. On pitocin since 1632 on 9/21 and turned off this morning at 0700 for recurrent lates. AROM with light mec at 1020. Pit restarted at 1030, will increase Pit to 2x2. IUPC placed into left lateral position.  GBS: unknown, on ampicillin Fetal Wellbeing:  Currently Cat I/II Pain Control:  Epidural Anticipated MOD:  SVD PreE/Partial HELLP: Mag level 8.7 currently. Will turn off and  recheck in 6 hours. Reflexes WNL. BP meds discontinued; will only give meds for severe ranges as late decelerations evolve with even mid-range or normal BP's. Platelets stable at 248. Cr stable at 2.32. LFT's WNL. GDM: Glucostabilizer. Insulin current off. Last glucose 110. CKD: Baseline Cr 1.7-1.9, currently 2.3, watching fluid status closely, no signs of volume overload at present.  [name], MD [facility] Family Medicine Fellow, [facility] for [facility], [facility] 06/29/2019, 1:11 PM

## 2019-06-29 NOTE — Progress Notes (Addendum)
LABOR PROGRESS NOTE  Renee Porter is a 33 y.o. [id number] at [redacted]w[redacted]d  admitted for IOL for severe PreE and partial HELLP syndrome.   Subjective: Continues to feel comfortable.  Objective: BP 134/90   Pulse 65   Temp 98.2 F (36.8 C) (Axillary)   Resp 16   Ht 4\' 8"  (1.422 m)   Wt 83.8 kg   LMP  (LMP Unknown)   SpO2 99%   BMI 41.42 kg/m  or  Vitals:   06/29/19 0831 06/29/19 0901 06/29/19 0931 06/29/19 1002  BP: 120/72 127/85 138/88 134/90  Pulse: 60 63 65 65  Resp: 16 17  16   Temp:      TempSrc:      SpO2: 97% 98% 99%   Weight:      Height:       Gen: comfortable CV: Regular rate Pulm: Normal effort Neuro: reflexes 2+  Dilation: 5 Effacement (%): 80 Cervical Position: Anterior Station: -3 Presentation: Vertex by exam  Exam by: Thedora Rings FHT: baseline rate 115, minimal to moderate varibility, -acel, -decel Toco: q5 min  Labs: Lab Results  Component Value Date   WBC 10.6 (H) 06/29/2019   HGB 12.9 06/29/2019   HCT 40.3 06/29/2019   MCV 83.8 06/29/2019   PLT 242 06/29/2019    Patient Active Problem List   Diagnosis Date Noted  . Preeclampsia, third trimester 06/24/2019  . Supervision of high risk pregnancy, antepartum 06/22/2019  . Chronic hypertension affecting pregnancy 06/22/2019  . Hypertension 07/20/2018  . SAB (spontaneous abortion) 07/20/2018  . Kidney disease 07/20/2018    Assessment / Plan: 33 y.o. 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  Labor: TOLAC with prior VBAC. FB placed at [place] and out at 0430. On pitocin since 1632 on 9/21 and turned off this morning at 0700 for recurrent lates. AROM with light mec at this check. Will restart Pit at 1x1.  GBS: unknown, on ampicillin Fetal Wellbeing:  Currently Cat I/II for minimal variability Pain Control:  Epidural Anticipated MOD:  SVD PreE/Partial HELLP: Cont Mg at 1g/hr rate. Last Mg level 6.7. Repeat at 1200. Repeat CBC and CMP at 1200; currently stable. BP meds  discontinued; will only give meds for severe ranges as late decelerations evolve with even mid-range or normal BP's.  GDM: Glucostabilizer. Insulin current off. Last glucose 107. CKD: Baseline Cr 1.7-1.9, currently 2.3, watching fluid status closely, no signs of volume overload at present.  [name], MD [facility] Family Medicine Fellow, [facility] for [facility], [place] [facility] 06/29/2019, 10:31 AM

## 2019-06-29 NOTE — Progress Notes (Signed)
Inpatient Diabetes Program Recommendations  Diabetes Treatment Program Recommendations  ADA Standards of Care 2018 Diabetes in Pregnancy Target Glucose Ranges:  Fasting: 60 - 90 mg/dL Preprandial: 60 - 105 mg/dL 1 hr postprandial: Less than 140mg /dL (from first bite of meal) 2 hr postprandial: Less than 120 mg/dL (from first bite of meal)    Lab Results  Component Value Date   GLUCAP 106 (H) 06/29/2019   HGBA1C 6.1 (H) 06/24/2019    Review of Glycemic Control Results for Renee Porter, Renee Porter (MRN [id number]) as of 06/29/2019 09:22  Ref. Range 06/29/2019 07:08 06/29/2019 08:03 06/29/2019 09:02  Glucose-Capillary Latest Ref Range: 70 - 99 mg/dL 103 (H) 103 (H) 106 (H)   Diabetes history: Newly diagnosed GDM Diabetes medications: none Current orders for Inpatient glycemic control: IV insulin  Inpatient Diabetes Program Recommendations:    Noted pitocin dc/d related to FHR tracing.  Once patient delivers, consider ordering CBGs TID&HS under glycemic control order set and observing glucose trends.     Thanks, Bronson Curb, MSN, RNC-OB Diabetes Coordinator [phone] (8a-5p)

## 2019-06-30 DIAGNOSIS — O1424 HELLP syndrome, complicating childbirth: Secondary | ICD-10-CM | POA: Diagnosis not present

## 2019-06-30 DIAGNOSIS — Z3A32 32 weeks gestation of pregnancy: Secondary | ICD-10-CM | POA: Diagnosis not present

## 2019-06-30 LAB — COMPREHENSIVE METABOLIC PANEL
ALT: 36 U/L (ref 0–44)
AST: 24 U/L (ref 15–41)
Albumin: 1.9 g/dL — ABNORMAL LOW (ref 3.5–5.0)
Alkaline Phosphatase: 129 U/L — ABNORMAL HIGH (ref 38–126)
Anion gap: 9 (ref 5–15)
BUN: 35 mg/dL — ABNORMAL HIGH (ref 6–20)
CO2: 21 mmol/L — ABNORMAL LOW (ref 22–32)
Calcium: 7.3 mg/dL — ABNORMAL LOW (ref 8.9–10.3)
Chloride: 100 mmol/L (ref 98–111)
Creatinine, Ser: 2.22 mg/dL — ABNORMAL HIGH (ref 0.44–1.00)
GFR calc Af Amer: 33 mL/min — ABNORMAL LOW (ref >60)
GFR calc non Af Amer: 29 mL/min — ABNORMAL LOW (ref >60)
Glucose, Bld: 107 mg/dL — ABNORMAL HIGH (ref 70–99)
Potassium: 4.7 mmol/L (ref 3.5–5.1)
Sodium: 130 mmol/L — ABNORMAL LOW (ref 135–145)
Total Bilirubin: 0.4 mg/dL (ref 0.3–1.2)
Total Protein: 5 g/dL — ABNORMAL LOW (ref 6.5–8.1)

## 2019-06-30 LAB — GLUCOSE, CAPILLARY: Glucose-Capillary: 191 mg/dL — ABNORMAL HIGH (ref 70–99)

## 2019-06-30 LAB — CULTURE, BETA STREP (GROUP B ONLY)

## 2019-06-30 LAB — MAGNESIUM: Magnesium: 6 mg/dL — ABNORMAL HIGH (ref 1.7–2.4)

## 2019-06-30 MED ORDER — ZOLPIDEM TARTRATE 5 MG PO TABS: 5.0000 mg | ORAL_TABLET | Freq: Every evening | ORAL | Status: DC | PRN

## 2019-06-30 NOTE — Anesthesia Postprocedure Evaluation (Signed)
Anesthesia Post Note  Patient: Renee Porter  Procedure(s) Performed: AN AD HOC LABOR EPIDURAL     Patient location during evaluation: Mother Baby Anesthesia Type: Epidural Level of consciousness: awake and alert Pain management: pain level controlled Vital Signs Assessment: post-procedure vital signs reviewed and stable Respiratory status: spontaneous breathing, nonlabored ventilation and respiratory function stable Cardiovascular status: stable Postop Assessment: no headache, no backache, epidural receding, no apparent nausea or vomiting, patient able to bend at knees, able to ambulate and adequate PO intake Anesthetic complications: no    Last Vitals:  Vitals:   06/30/19 1016 06/30/19 1102  BP: 136/67 (!) 149/74  Pulse: 87 87  Resp:  18  Temp:  37 C  SpO2:  97%    Last Pain:  Vitals:   06/30/19 1102  TempSrc: Oral  PainSc:    Pain Goal: Patients Stated Pain Goal: 2 (06/25/19 1524)                 Levone Otten Hristova

## 2019-06-30 NOTE — Lactation Note (Signed)
This note was copied from a baby's chart. Lactation Consultation Note  Patient Name: Renee Porter [id number] Date: 06/30/2019 Reason for consult: Initial assessment;NICU baby;Preterm <34wks;Infant < 6lbs;Maternal endocrine disorder Type of Endocrine Disorder?: Diabetes  LC in to visit with P3 Mom of [redacted]w[redacted]d baby in the NICU.  Baby 12 hrs old, and Mom just assisted with pump for first time.    NICU booklet in Spanish given to Mom.  Mom encouraged to read this.  Instructed Mom to pump every 2-3 hrs for 15 min on initiation setting.  Mom knows how to do breast massage and hand expression and has colostrum containers and labels.    Mom knows to transport any EBM to NICU when she goes to see baby.    Reviewed importance of disassembling pump parts and washing, rinsing and air drying.    Mom states she has [facility], faxed request for DEBP on discharge. No questions currently per Mom.  Interventions Interventions: Breast massage;Hand express;DEBP;Skin to skin  Lactation Tools Discussed/Used Tools: Pump Breast pump type: Double-Electric Breast Pump WIC Program: Yes Pump Review: Setup, frequency, and cleaning;Milk Storage Initiated by:: [place] RN Date initiated:: 06/30/19   Consult Status Consult Status: Follow-up Date: 07/01/19 Follow-up type: In-patient    Broadus John 06/30/2019, 1:33 PM

## 2019-06-30 NOTE — Progress Notes (Signed)
Post Partum Day 1  Subjective: Pt without complaints this morning. Tolerating diet. Pain controlled. Denies HA or visual changes  Objective: Blood pressure (!) 149/74, pulse 87, temperature 98.6 F (37 C), temperature source Oral, resp. rate 18, height 4\' 8"  (1.422 m), weight 83.8 kg, SpO2 97 %, unknown if currently breastfeeding.  Physical Exam:  General: alert Lochia: appropriate Uterine Fundus: firm Incision: NA DVT Evaluation: No evidence of DVT seen on physical exam.  Recent Labs    06/29/19 1207 06/29/19 1728  HGB 12.4 12.9  HCT 38.5 38.2    Assessment/Plan: PPD # 1 VBAC CHTN with SIPEC CKD  Stable. Magnesium x 24 hrs. BP stable on Norvasc. Renal function improving. Repeat labs in AM. Infant stable in NICU. Appreciate nephrology assisting in her care Continue with progressive care.    LOS: 6 days   Renee Porter 06/30/2019, 11:11 AM

## 2019-07-01 DIAGNOSIS — O1424 HELLP syndrome, complicating childbirth: Secondary | ICD-10-CM

## 2019-07-01 DIAGNOSIS — Z3A32 32 weeks gestation of pregnancy: Secondary | ICD-10-CM

## 2019-07-01 LAB — COMPREHENSIVE METABOLIC PANEL
ALT: 31 U/L (ref 0–44)
AST: 19 U/L (ref 15–41)
Albumin: 2.1 g/dL — ABNORMAL LOW (ref 3.5–5.0)
Alkaline Phosphatase: 114 U/L (ref 38–126)
Anion gap: 10 (ref 5–15)
BUN: 41 mg/dL — ABNORMAL HIGH (ref 6–20)
CO2: 23 mmol/L (ref 22–32)
Calcium: 7.3 mg/dL — ABNORMAL LOW (ref 8.9–10.3)
Chloride: 102 mmol/L (ref 98–111)
Creatinine, Ser: 2.25 mg/dL — ABNORMAL HIGH (ref 0.44–1.00)
GFR calc Af Amer: 33 mL/min — ABNORMAL LOW (ref >60)
GFR calc non Af Amer: 28 mL/min — ABNORMAL LOW (ref >60)
Glucose, Bld: 111 mg/dL — ABNORMAL HIGH (ref 70–99)
Potassium: 3.8 mmol/L (ref 3.5–5.1)
Sodium: 135 mmol/L (ref 135–145)
Total Bilirubin: 0.2 mg/dL — ABNORMAL LOW (ref 0.3–1.2)
Total Protein: 5.2 g/dL — ABNORMAL LOW (ref 6.5–8.1)

## 2019-07-01 LAB — CBC
HCT: 34.9 % — ABNORMAL LOW (ref 36.0–46.0)
Hemoglobin: 11.1 g/dL — ABNORMAL LOW (ref 12.0–15.0)
MCH: 27 pg (ref 26.0–34.0)
MCHC: 31.8 g/dL (ref 30.0–36.0)
MCV: 84.9 fL (ref 80.0–100.0)
Platelets: 208 10*3/uL (ref 150–400)
RBC: 4.11 MIL/uL (ref 3.87–5.11)
RDW: 17.3 % — ABNORMAL HIGH (ref 11.5–15.5)
WBC: 10.6 10*3/uL — ABNORMAL HIGH (ref 4.0–10.5)
nRBC: 0 % (ref 0.0–0.2)

## 2019-07-01 LAB — SURGICAL PATHOLOGY

## 2019-07-01 MED ORDER — METOPROLOL TARTRATE 100 MG PO TABS
100.0000 mg | ORAL_TABLET | Freq: Two times a day (BID) | ORAL | Status: DC
  Administered 2019-07-01 – 2019-07-03 (×5): 100 mg via ORAL
  Filled 2019-07-01 (×7): qty 1

## 2019-07-01 NOTE — Progress Notes (Signed)
Called and made Dr. Ernestina Patches aware of RN having to use the hydralazine protocol for the pt once again this afternoon. No new orders received at this time.

## 2019-07-01 NOTE — Progress Notes (Addendum)
[facility] Progress Note    Assessment/ Plan:   33 year old female in third trimester of pregnancy presenting with uncontrolled hypertension, chronic renal insufficiency, nephrotic range proteinuria and hypoalbuminemia.   1. CKD 3 with nephrotic range proteinuria, predating pregnancy, unclear etiology:Baseline Cr appears to be 1.7-1.9. Reports a history of lupus in her half-sister. UA bland without RBCs or WBCs.ANA positive, anti-SM ab negative, SSA/ SSBnegative, ENA negative.Complements WNL.HIV, hep serologies negative, RPR negative. -Her Cr has risen from 1.9-->2.67and blood pressures were  in the severe range. Her BUN has steadily increasedw/ significant proteinuria (repeatUP/C 2 g) ->24 hr urine 2.27 gm.  She has AKI in pregnancy (etiology as yet undetermined; does not appear to be GN related) which could result in permanent advancement of CKD and/or progression to ESRD -> Her renal function has finally started to  equilibrate but still w/ signif dysfunction -> TOLAC -> post VBAC D2.  May take some time to stabilize; f/u with [place] in 2 weeks.    2. Chronic HTN withlikelysuperimposed pre-eclampsia:BP still not at goal.uptitratedlabetalol to 600 TID 9/19,procardia XL 30 9/19, increasedto 60 mg 9/20 -> Metoprolol 100mg  BID + Norvasc. Will hold off on titrating too quickly as Norvasc takes days to work and do not want her to become symptomatic hypotensive.  May need to continue the anti-hypertensives even after delivery as there is a variable time interval response.  3. Third trimester pregnancy: 32 weeks by [place] 06/24/2019. S/p 2 doses BMZ  4. Gestational DM: on insulin  5.Dispo: remains inpt. I appreciate MFM and OB colleagues with coordination of care.  Subjective:   Denies f/c/n/v/dyspnea/HA/ visual disturbances   Objective:   BP (!) 150/92 (BP Location: Left Arm) Comment: notified nurse  Pulse 79    Temp 98.7 F (37.1 C) (Oral)   Resp 18   Ht 4\' 8"  (1.422 m)   Wt 83.8 kg   LMP  (LMP Unknown)   SpO2 98%   Breastfeeding Unknown   BMI 41.42 kg/m   Intake/Output Summary (Last 24 hours) at 07/01/2019 1055 Last data filed at 07/01/2019 0930 Gross per 24 hour  Intake 2462.6 ml  Output 3925 ml  Net -1462.4 ml   Weight change:   Physical Exam: Gen:NAD,sitting in a chair HEENT: no JVD, sclerae anicteric CVS: RRR no m/r/g Resp: clear bilaterally no c/w/r Abd: SND minimal tenderness Ext: trace L LE edema and RLE edema  Imaging: No results found.  Labs: BMET Recent Labs  Lab 06/28/19 1748 06/29/19 0003 06/29/19 [id number] 06/29/19 1207 06/29/19 1713 06/30/19 0509 07/01/19 0540  NA 134* 134* 132* 132* 130* 130* 135  K 4.5 4.7 4.7 4.7 5.0 4.7 3.8  CL 105 104 101 101 100 100 102  CO2 20* 21* 20* 21* 21* 21* 23  GLUCOSE 74 73 102* 115* 120* 107* 111*  BUN 50* 47* 44* 38* 36* 35* 41*  CREATININE 2.39* 2.38* 2.34* 2.32* 2.26* 2.22* 2.25*  CALCIUM 8.6* 8.1* 7.6* 7.6* 7.2* 7.3* 7.3*   CBC Recent Labs  Lab 06/27/19 1129 06/28/19 0545  06/29/19 0633 06/29/19 1207 06/29/19 1728 07/01/19 0540  WBC  --  7.2   < > 10.6* 10.7* 12.9* 10.6*  NEUTROABS 5.8 5.4  --   --   --   --   --   HGB  --  12.7   < > 12.9 12.4 12.9 11.1*  HCT  --  39.9   < > 40.3 38.5 38.2 34.9*  MCV  --  84.5   < >  83.8 83.5 83.4 84.9  PLT  --  259   < > 242 248 211 208   < > = values in this interval not displayed.    Medications:    . amLODipine  10 mg Oral QHS  . enoxaparin (LOVENOX) injection  40 mg Subcutaneous Q24H  . measles, mumps & rubella vaccine  0.5 mL Subcutaneous Once  . metoprolol tartrate  100 mg Oral BID  . prenatal multivitamin  1 tablet Oral Q1200  . senna-docusate  2 tablet Oral Q24H  . Tdap  0.5 mL Intramuscular Once      Otelia Santee, MD 07/01/2019, 10:55 AM

## 2019-07-01 NOTE — Progress Notes (Signed)
Post Partum Day # 2 VBAC Subjective: Pt without complaints today. Denies HA or visual changes. Ambulating, voiding, tolerating diet and good oral pain control.   Objective: Blood pressure (!) 152/75, pulse 76, temperature 98.6 F (37 C), temperature source Oral, resp. rate 18, height 4\' 8"  (1.422 m), weight 83.8 kg, SpO2 98 %, unknown if currently breastfeeding.  Physical Exam:  General: alert Lochia: appropriate Uterine Fundus: firm Incision: NA DVT Evaluation: No evidence of DVT seen on physical exam.  Recent Labs    06/29/19 1728 07/01/19 0540  HGB 12.9 11.1*  HCT 38.2 34.9*    Assessment/Plan: PPD # 2 VBAC CHTN with SIPEC CKD  Adjusting BP meds for better control, appears to be headed in the right direction. Renal function stable. F/U with nephrology in 2 weeks. Appreciated their help in pt's care. Hopeful for discharge tomorrow if BP remains controlled   LOS: 7 days   Chancy Milroy 07/01/2019, 2:25 PM

## 2019-07-01 NOTE — Clinical Social Work Maternal (Signed)
CLINICAL SOCIAL WORK MATERNAL/CHILD NOTE  Patient Details  Name: Renee Porter MRN: [id number] Date of Birth: [birth year]  Date:  07/01/2019  Clinical Social Worker Initiating Note:  Abundio Miu, [place] Date/Time: Initiated:  07/01/19/1333     Child's Name:  Renee Porter   Biological Parents:  Mother, Father(Father: Antionette Char)   Need for Interpreter:  Spanish   Reason for Referral:  Parental Support of Premature Babies < 58 weeks/or Critically Ill babies, Late or No Prenatal Care    Address:  [address] [place] [address]    Phone number:  [phone] (home)     Additional phone number:   Household Members/Support Persons (HM/SP):       HM/SP Name Relationship DOB or Age  HM/SP -1  Antionette Char  FOB    HM/SP -2    Mother in law    HM/SP -3  Anayelis Porter  daughter  01/02/07  HM/SP -4  Lilliana Porter  daughter  12/29/09  HM/SP -5        HM/SP -6        HM/SP -7        HM/SP -8          Natural Supports (not living in the home):      Professional Supports:     Employment: Part-time   Type of Work: Administrator, sports   Education:  Other (comment)(6th Grade)   Homebound arranged:    Museum/gallery curator Resources:  Self-Pay    Other Resources:  [facility]   Cultural/Religious Considerations Which May Impact Care:    Strengths:  Ability to meet basic needs , Understanding of illness   Psychotropic Medications:         Pediatrician:       Pediatrician List:   [place]      Pediatrician Fax Number:    Risk Factors/Current Problems:  None   Cognitive State:  Able to Concentrate , Alert , Goal Oriented , Insightful , Linear Thinking    Mood/Affect:  Calm , Interested , Relaxed    CSW Assessment: CSW met with MOB at bedside to discuss infant's NICU admission and no  prenatal care. [facility] present and interpreted during assessment. CSW introduced self and explained reason for consult. MOB was polite and engaged during assessment. MOB reported that she resides with FOB, mother in law and 2 daughters. MOB reported that her mother in law helps care for the children. MOB reported that she works at [employer] and plans to return once she feels better. MOB reported that so far the only thing that she has purchased for infant is a crib. MOB reported that infant's birth so early was unexpected. CSW acknowledged and normalized infant's early birth and not having a lot of things purchased so far. CSW informed MOB about [facility] being able to assist with some items for infant if needed, MOB reported okay. CSW inquired about MOB's support system, MOB reported that her mother in law is her support.   CSW inquired about MOB's mental health history, MOB denied any mental health history. MOB denied any history of postpartum depression. CSW inquired about how MOB was feeling emotionally, MOB reported that she was happy with baby. MOB presented calm and did not demonstrate any acute mental health signs/symptoms. CSW assessed for safety, MOB denied  SI, HI and domestic violence.   CSW provided education regarding the baby blues period vs. perinatal mood disorders, discussed treatment and gave resources for mental health follow up if concerns arise.  CSW recommends self-evaluation during the postpartum time period using the New Mom Checklist from Postpartum Progress and encouraged MOB to contact a medical professional if symptoms are noted at any time.    CSW and MOB discussed infant's NICU admission. CSW informed MOB about the NICU, what to expect and resources/supports available while infant is admitted to the NICU. MOB reported that the NICU admission has been fine and that she feels well informed about infant's care. MOB denied any current needs/concerns.  CSW encouraged MOB to contact CSW if any needs/concerns arise.   CSW informed MOB about the hospital drug screen policy due to no prenatal care. MOB reported that she tried to make several appointment at the [facility] but was unsuccessful due to COVID 19. MOB reported that she had one visit via video. MOB denied any substance use during pregnancy. CSW informed MOB that infant's UDS was negative and CDS would continue to be monitored and a CPS report would be made if warranted. MOB denied any questions/concerns.   CSW will continue to offer resources/supports while infant is admitted to the NICU.   CSW Plan/Description:  Perinatal Mood and Anxiety Disorder (PMADs) Education, [place], CSW Will Continue to Monitor Umbilical Cord Tissue Drug Screen Results and Make Report if [place], Other Patient/Family Education    Burnis Medin, LCSW 07/01/2019, 1:43 PM

## 2019-07-02 LAB — COMPREHENSIVE METABOLIC PANEL
ALT: 49 U/L — ABNORMAL HIGH (ref 0–44)
AST: 50 U/L — ABNORMAL HIGH (ref 15–41)
Albumin: 2 g/dL — ABNORMAL LOW (ref 3.5–5.0)
Alkaline Phosphatase: 107 U/L (ref 38–126)
Anion gap: 12 (ref 5–15)
BUN: 37 mg/dL — ABNORMAL HIGH (ref 6–20)
CO2: 22 mmol/L (ref 22–32)
Calcium: 8.3 mg/dL — ABNORMAL LOW (ref 8.9–10.3)
Chloride: 101 mmol/L (ref 98–111)
Creatinine, Ser: 2.09 mg/dL — ABNORMAL HIGH (ref 0.44–1.00)
GFR calc Af Amer: 36 mL/min — ABNORMAL LOW (ref >60)
GFR calc non Af Amer: 31 mL/min — ABNORMAL LOW (ref >60)
Glucose, Bld: 219 mg/dL — ABNORMAL HIGH (ref 70–99)
Potassium: 4.1 mmol/L (ref 3.5–5.1)
Sodium: 135 mmol/L (ref 135–145)
Total Bilirubin: 0.3 mg/dL (ref 0.3–1.2)
Total Protein: 5.4 g/dL — ABNORMAL LOW (ref 6.5–8.1)

## 2019-07-02 LAB — CBC
HCT: 34.3 % — ABNORMAL LOW (ref 36.0–46.0)
Hemoglobin: 10.9 g/dL — ABNORMAL LOW (ref 12.0–15.0)
MCH: 27.6 pg (ref 26.0–34.0)
MCHC: 31.8 g/dL (ref 30.0–36.0)
MCV: 86.8 fL (ref 80.0–100.0)
Platelets: 247 10*3/uL (ref 150–400)
RBC: 3.95 MIL/uL (ref 3.87–5.11)
RDW: 17.2 % — ABNORMAL HIGH (ref 11.5–15.5)
WBC: 10.3 10*3/uL (ref 4.0–10.5)
nRBC: 0 % (ref 0.0–0.2)

## 2019-07-02 LAB — GLUCOSE, CAPILLARY
Glucose-Capillary: 108 mg/dL — ABNORMAL HIGH (ref 70–99)
Glucose-Capillary: 173 mg/dL — ABNORMAL HIGH (ref 70–99)

## 2019-07-02 MED ORDER — AMLODIPINE BESYLATE 10 MG PO TABS: 10.0000 mg | ORAL_TABLET | Freq: Every day | ORAL | 1 refills | Status: DC

## 2019-07-02 MED ORDER — METOPROLOL TARTRATE 100 MG PO TABS: 100.0000 mg | ORAL_TABLET | Freq: Two times a day (BID) | ORAL | 1 refills | Status: DC

## 2019-07-02 NOTE — Discharge Instructions (Signed)
[facility] [place] [address] [phone]     Informacin bsica sobre la diabetes Diabetes Basics  La diabetes (diabetes mellitus) es una enfermedad de larga duracin (crnica). Se produce cuando el cuerpo no utiliza Occupational hygienist (glucosa) que se libera de los alimentos despus de comer. La diabetes puede deberse a uno de [employer] o a ambos:  El pncreas no produce suficiente cantidad de una hormona llamada insulina.  El cuerpo no reacciona de forma normal a la insulina que produce. La insulina permite que ciertos azcares (glucosa) ingresen a las clulas del cuerpo. Esto le proporciona energa. Si tiene diabetes, los azcares no pueden ingresar a las clulas. Esto produce un aumento del nivel de Dispensing optician (hiperglucemia). Sigue estas instrucciones en tu casa: Cmo se trata la diabetes? Es posible que tenga que administrarse insulina u otros medicamentos para la diabetes todos los [place] para mantener el nivel de Location manager en la sangre equilibrado. Adminstrese los medicamentos para la diabetes todos los [place] se lo haya indicado el mdico. Haga una lista de los medicamentos para la diabetes aqu: Medicamentos para la diabetes  Nombre del medicamento: ______________________________ ? Cantidad (dosis): ________________ Mellody Drown (a.m./p.m.): _______________ Wilford Grist: ___________________________________  Micki Riley medicamento: ______________________________ ? Cantidad (dosis): ________________ Mellody Drown (a.m./p.m.): _______________ Wilford Grist: ___________________________________  Micki Riley medicamento: ______________________________ ? Cantidad (dosis): ________________ Mellody Drown (a.m./p.m.): _______________ Wilford Grist: ___________________________________ Si [place] insulina, aprender cmo aplicrsela con inyecciones. Es posible que deba ajustar la cantidad en funcin de los alimentos que coma. Haga una lista de los tipos de  [place] aqu: Insulina  Tipo de insulina: ______________________________ ? Cantidad (dosis): ________________ Mellody Drown (a.m./p.m.): _______________ Wilford Grist: ___________________________________  Suezanne Jacquet: ______________________________ ? Cantidad (dosis): ________________ Mellody Drown (a.m./p.m.): _______________ Wilford Grist: ___________________________________  Suezanne Jacquet: ______________________________ ? Cantidad (dosis): ________________ Mellody Drown (a.m./p.m.): _______________ Wilford Grist: ___________________________________  Suezanne Jacquet: ______________________________ ? Cantidad (dosis): ________________ Mellody Drown (a.m./p.m.): _______________ Wilford Grist: ___________________________________  Suezanne Jacquet: ______________________________ ? Cantidad (dosis): ________________ Mellody Drown (a.m./p.m.): _______________ Wilford Grist: ___________________________________ Cmo me controlo el nivel de azcar en la sangre?  Controle sus niveles de azcar en la sangre con un medidor de glucemia segn las indicaciones del mdico. El mdico fijar los objetivos del tratamiento para usted. Generalmente, los resultados de los niveles de azcar en la sangre deben ser los siguientes:  Antes de las comidas (preprandial): de 80 a 130mg /dl (de 4,4 a 7,34mmol/l).  Despus de las comidas (posprandial): por debajo de 180mg /dl (44mmol/l).  Nivel de A1c: menos del 7%. Anote las veces que se controlar los niveles de azcar en la sangre: Controles de azcar en la sangre  Hora: _______________ Wilford Grist: ___________________________________  Mellody Drown: _______________ Notas: ___________________________________  Hora: _______________ Notas: ___________________________________  Hora: _______________ Notas: ___________________________________  Hora: _______________ Notas: ___________________________________  Hora: _______________ Notas: ___________________________________  Sander Nephew debo saber acerca del nivel bajo de azcar en  la sangre? Un nivel bajo de azcar en la sangre se denomina hipoglucemia. Este cuadro ocurre cuando el nivel de Location manager en la sangre es igual o menor que 70mg /dl (3,30mmol/l). Entre los sntomas, se pueden incluir los siguientes:  Sentir: ? [place]. ? Preocupacin o nervios (ansiedad). ? Sudoracin y [employer]. ? Confusin. ? Mareos. ? Somnolencia. ? Ganas de vomitar (nuseas).  Tener: ? Latidos cardacos acelerados. ? Dolor de [place]. ? Cambios en la visin. ? Hormigueo y falta de sensibilidad (entumecimiento) alrededor de la boca, los labios o la [place]. ? Movimientos espasmdicos que no puede controlar (convulsiones).  Dificultades para hacer lo siguiente: ? Moverse (coordinacin). ? [place]. ? Desmayos. ? Molestarse con facilidad (irritabilidad). Tratamiento del nivel bajo de azcar en la sangre Para tratar un nivel bajo de azcar en la sangre, ingiera un alimento o una bebida azucarada de inmediato. Si puede pensar con claridad y tragar de manera segura, siga la regla 15/15, que consiste en lo siguiente:  Consuma 15gramos de un hidrato de carbono de accin rpida (carbohidrato). Hable con su mdico acerca de cunto debera consumir.  Algunos hidratos de carbono de accin rpida son: ? Comprimidos de azcar (pastillas de glucosa). Consuma 3o 4pastillas de glucosa. ? De 6 a 8unidades de caramelos duros. ? De 4 a 6onzas (de 120 a 144ml) de jugo de frutas. ? De 4 a 6onzas (de 120 a 135ml) de refresco comn (no diettico). ? 1 cucharada (13ml) de miel o azcar.  Contrlese el nivel de azcar en la sangre 30minutos despus de ingerir el hidrato de carbono.  Si el nivel de azcar en la sangre todava es igual o menor que 70mg /dl (3,10mmol/l), ingiera nuevamente 15gramos de un hidrato de carbono.  Si el nivel de azcar en la sangre no supera los 70mg /dl (3,23mmol/l) despus de 3intentos, solicite ayuda de inmediato.  Ingiera una comida o una colacin en el  transcurso de 1hora despus de que el nivel de azcar en la sangre se haya normalizado. Tratamiento del nivel muy bajo de azcar en la sangre Si el nivel de azcar en la sangre es igual o menor que 54mg /dl (27mmol/l), significa que est muy bajo (hipoglucemia grave). Esto es Engineer, maintenance (IT). No espere a ver si los sntomas desaparecen. Solicite atencin mdica de inmediato. Comunquese con el servicio de emergencias de su localidad (911 en los [place]). No conduzca por sus propios medios [employer] hospital. Preguntas para hacerle al mdico  Es necesario que me rena con Radio broadcast assistant en el cuidado de la diabetes?  Qu equipos necesitar para cuidarme en casa?  Qu medicamentos para la diabetes necesito? Cundo debo tomarlos?  Con qu frecuencia debo controlar mi nivel de azcar en la sangre?  A qu nmero puedo llamar si tengo preguntas?  Cundo es mi prxima cita con el mdico?  Dnde puedo encontrar un grupo de apoyo para las personas con diabetes? Dnde buscar ms informacin  American Diabetes Association (Asociacin Estadounidense de la Diabetes): www.diabetes.org  American Association of Diabetes Educators (Asociacin Estadounidense de Instructores para el Cuidado de la Diabetes): www.diabeteseducator.org/patient-resources Comunquese con un mdico si:  El nivel de azcar en la sangre es igual o mayor que 240mg /dl (13,35mmol/dl) durante 2das seguidos.  Ha estado enfermo o ha tenido fiebre durante 2das o ms y no mejora.  Tiene alguno de estos problemas durante ms de 6horas: ? No puede comer ni beber. ? Siente malestar estomacal (nuseas). ? Vomita. ? Presenta heces lquidas (diarrea). Solicite ayuda inmediatamente si:  El nivel de azcar en la sangre est por debajo de 54mg /dl (8mmol/l).  Se siente confundida.  Tiene dificultad para hacer lo siguiente: ? Pensar con claridad. ? La respiracin. Resumen  La diabetes (diabetes mellitus) es una  enfermedad de larga duracin (crnica). Se produce cuando el cuerpo no utiliza Occupational hygienist (glucosa) que se libera de los alimentos despus de la digestin.  Aplquese la insulina y tome los medicamentos para la diabetes como se lo hayan indicado.  Contrlese el nivel de azcar en la sangre todos los [place], con la frecuencia que le hayan indicado.  Concurra a todas las  visitas de seguimiento como se lo haya indicado el mdico. Esto es importante. Esta informacin no tiene Marine scientist el consejo del mdico. Asegrese de hacerle al mdico cualquier pregunta que tenga. Document Released: 01/30/2018 Document Revised: 11/18/2018 Document Reviewed: 01/30/2018 Elsevier Patient Education  2020 [employer].

## 2019-07-02 NOTE — Progress Notes (Signed)
Post Partum Day 3 VBAC Subjective: Pt without complaints this morning. Denies HA or visual changes. Ambulating and voiding without problems. Tolerating diet  Objective: Blood pressure 115/69, pulse 80, temperature 98 F (36.7 C), temperature source Oral, resp. rate 17, height 4\' 8"  (1.422 m), weight 83.8 kg, SpO2 99 %, unknown if currently breastfeeding.  Physical Exam:  General: alert Lochia: appropriate Uterine Fundus: firm Incision: NA DVT Evaluation: No evidence of DVT seen on physical exam.  Recent Labs    07/01/19 0540 07/02/19 0954  HGB 11.1* 10.9*  HCT 34.9* 34.3*    Assessment/Plan: PPD # 3 VBAC CHTN with SIPEC CKD ? Type 2 DM  BP control is still an issue for Ms Ramos-Gregorio. Received 5 doses of Hydrazine yesterday and 3 doses of Labetalol today. BP 100-120/50-70's now. Ideal BP form an OB standpoint is <140/90. Renal function has improved. Will continue to monitor BP. Should be reaching point were BP's are taking complete affect. Appreciated nephrology seeing and any additional input for BP control. LFT's elevated, doubt HELLP. Pt is s/p 24 hrs magnesium. Will follow. CBG's elevated. A2DM with elevated A1c as well. Will change to diabetic diet and monitor CBG's. Hopefully home in the next 1-2 days. Note for BP check next Tuesday in office sent to [facility] pool. Discussed importance of taking medications and follow for BP and renal check discussed with pt.  Interrupter services used during today's visit    LOS: 8 days   Chancy Milroy 07/02/2019, 11:13 AM

## 2019-07-02 NOTE — Progress Notes (Signed)
Ervin MD called to notify of pt increased BP.  Pt previously received hydralazine as first medication.  MD given order to begin with labetalol for hypertension protocol.

## 2019-07-02 NOTE — Lactation Note (Signed)
This note was copied from a baby's chart. Lactation Consultation Note  Patient Name: Renee Porter [id number] Date: 07/02/2019 Reason for consult: Follow-up assessment;Preterm <34wks;Infant < 6lbs;NICU baby  P3 mother whose infant is now 79 hours old.  This is a preterm baby at 32+5 weeks with a CGA of 33+1 weeks, weighing < 6 lbs and in the NICU.  Mother breast fed her other two children for one year each.  Her feeding preference on admission was breast/bottle.  Spanish interpreter, Arbie Cookey, used for interpretation.  Spoke with RN prior to entering room.  Mother will not be discharged today due to BP issues.    Mother has not pumped since 1900 last evening.  Prior to that she had been pumping every 3 1/2-4 hours.  I asked her if she could pump more frequently like every 2 1/2-3 hours and she stated she could do this.  She is familiar with hand expression and I encouraged this before/after feedings to help with supply.  Mother has not been able to express any colostrum drops at this time.    Reviewed pump and milk storage times.  Mother had no questions about the pump.  She knows how to clean pump parts.    [facility] referral was faxed by previous [place].  [facility] office has called mother and she will be eligible to pick up her pump after discharge.  I informed her that if she is discharged this weekend we would be able to provide a loaner pump until the [facility] office is open on Monday.  Mother verbalized understanding.    Mother is tearful and does not understand why she has to remain in the hospital with no visitors.  Reviewed the reason for her staying longer and the restrictions of visitation due to Covid.  Suggested mother use Facetime on her phone to talk to family members and she is doing this.  Clarified some questions she had with the RN and mother will call if she has any further questions/concerns.  RN updated.   Maternal Data Formula Feeding for Exclusion: Yes Reason for exclusion: Mother's  choice to formula and breast feed on admission Has patient been taught Hand Expression?: Yes Does the patient have breastfeeding experience prior to this delivery?: Yes  Feeding    LATCH Score                   Interventions    Lactation Tools Discussed/Used WIC Program: Yes Pump Review: (Reviewed) Initiated by:: Apollo Timothy Date initiated:: 07/02/19   Consult Status Consult Status: Follow-up Date: 07/03/19 Follow-up type: In-patient    Isiaah Cuervo R Otilio Groleau 07/02/2019, 9:17 AM

## 2019-07-02 NOTE — Progress Notes (Signed)
Pt was in good spirits and was grateful that her baby was doing well.  She is eager to get home to her older children.  She stated no other particular needs at this time.    [place], [place] Pager, [phone] 2:52 PM

## 2019-07-03 LAB — COMPREHENSIVE METABOLIC PANEL
ALT: 73 U/L — ABNORMAL HIGH (ref 0–44)
AST: 63 U/L — ABNORMAL HIGH (ref 15–41)
Albumin: 2.1 g/dL — ABNORMAL LOW (ref 3.5–5.0)
Alkaline Phosphatase: 101 U/L (ref 38–126)
Anion gap: 8 (ref 5–15)
BUN: 37 mg/dL — ABNORMAL HIGH (ref 6–20)
CO2: 26 mmol/L (ref 22–32)
Calcium: 8.6 mg/dL — ABNORMAL LOW (ref 8.9–10.3)
Chloride: 104 mmol/L (ref 98–111)
Creatinine, Ser: 1.94 mg/dL — ABNORMAL HIGH (ref 0.44–1.00)
GFR calc Af Amer: 39 mL/min — ABNORMAL LOW (ref >60)
GFR calc non Af Amer: 34 mL/min — ABNORMAL LOW (ref >60)
Glucose, Bld: 113 mg/dL — ABNORMAL HIGH (ref 70–99)
Potassium: 4.1 mmol/L (ref 3.5–5.1)
Sodium: 138 mmol/L (ref 135–145)
Total Bilirubin: 0.3 mg/dL (ref 0.3–1.2)
Total Protein: 5.5 g/dL — ABNORMAL LOW (ref 6.5–8.1)

## 2019-07-03 LAB — GLUCOSE, CAPILLARY: Glucose-Capillary: 107 mg/dL — ABNORMAL HIGH (ref 70–99)

## 2019-07-03 MED ORDER — NIFEDIPINE ER OSMOTIC RELEASE 30 MG PO TB24
60.0000 mg | ORAL_TABLET | Freq: Every day | ORAL | Status: DC
  Administered 2019-07-03 – 2019-07-05 (×3): 60 mg via ORAL
  Filled 2019-07-03 (×3): qty 2

## 2019-07-03 MED ORDER — LABETALOL HCL 200 MG PO TABS
200.0000 mg | ORAL_TABLET | Freq: Two times a day (BID) | ORAL | Status: DC
  Administered 2019-07-03 – 2019-07-04 (×3): 200 mg via ORAL
  Filled 2019-07-03 (×3): qty 1

## 2019-07-03 NOTE — Progress Notes (Signed)
[facility] Progress Note    Assessment/ Plan:   33 year old female in third trimester of pregnancy presenting with uncontrolled hypertension, chronic renal insufficiency, nephrotic range proteinuria and hypoalbuminemia.   1. CKD 3 with nephrotic range proteinuria, predating pregnancy, unclear etiology:Baseline Cr appears to be 1.7-1.9. Reports a history of lupus in her half-sister. UA bland without RBCs or WBCs.ANA positive, anti-SM ab negative, SSA/ SSBnegative, ENA negative.Complements WNL.HIV, hep serologies negative, RPR negative. -Her Cr has risen from 1.9-->2.67and blood pressures were  in the severe range. Her BUN has steadily increasedw/ significant proteinuria (repeatUP/C 2 g) ->24 hr urine2.27 gm.  She has AKI in pregnancy (etiology as yet undetermined; does not appear to be GN related) which could result in permanent advancement of CKD and/or progression to ESRD->Her renal functionhas finally started toequilibrate but still w/ signif dysfunction -> TOLAC -> post VBAC D2.  May take some time to stabilize; f/u with [place] in 2 weeks.    2. Chronic HTN withlikelysuperimposed pre-eclampsia:BP still not at goal.uptitratedlabetalol to 600 TID 9/19,procardia XL 30 9/19, increasedto 60 mg 9/20 -> Metoprolol 100mg  BID + Norvasc.   Let's change to Labetolol 200mg  PO BID and Procardia XL 60mg  daily   3. Third trimester pregnancy: 32 weeks by [place] 06/24/2019. S/p 2 doses BMZ  4. Gestational DM: on insulin  5.Dispo: remains inpt. I appreciate MFM and OB colleagues with coordination of care.  Subjective:   Denies f/c/n/v/dyspnea/HA/ visual disturbances   Objective:   BP (!) 150/82 (BP Location: Left Arm)   Pulse 82   Temp 97.8 F (36.6 C) (Oral)   Resp 18   Ht 4\' 8"  (1.422 m)   Wt 83.8 kg   LMP  (LMP Unknown)   SpO2 98%   Breastfeeding Unknown   BMI 41.42 kg/m   Intake/Output Summary  (Last 24 hours) at 07/03/2019 1014 Last data filed at 07/03/2019 0810 Gross per 24 hour  Intake 1080 ml  Output 2700 ml  Net -1620 ml   Weight change:   Physical Exam: Gen:NAD,sitting in a chair HEENT: no JVD, sclerae anicteric CVS: RRR no m/r/g Resp: clear bilaterally no c/w/r Abd: SND minimal tenderness Ext: trace L LE edema and RLE edema  Imaging: No results found.  Labs: BMET Recent Labs  Lab 06/29/19 [phone] 06/29/19 1207 06/29/19 1713 06/30/19 0509 07/01/19 0540 07/02/19 0954 07/03/19 0534  NA 132* 132* 130* 130* 135 135 138  K 4.7 4.7 5.0 4.7 3.8 4.1 4.1  CL 101 101 100 100 102 101 104  CO2 20* 21* 21* 21* 23 22 26   GLUCOSE 102* 115* 120* 107* 111* 219* 113*  BUN 44* 38* 36* 35* 41* 37* 37*  CREATININE 2.34* 2.32* 2.26* 2.22* 2.25* 2.09* 1.94*  CALCIUM 7.6* 7.6* 7.2* 7.3* 7.3* 8.3* 8.6*   CBC Recent Labs  Lab 06/27/19 1129 06/28/19 0545  06/29/19 1207 06/29/19 1728 07/01/19 0540 07/02/19 0954  WBC  --  7.2   < > 10.7* 12.9* 10.6* 10.3  NEUTROABS 5.8 5.4  --   --   --   --   --   HGB  --  12.7   < > 12.4 12.9 11.1* 10.9*  HCT  --  39.9   < > 38.5 38.2 34.9* 34.3*  MCV  --  84.5   < > 83.5 83.4 84.9 86.8  PLT  --  259   < > 248 211 208 247   < > = values in this interval not displayed.  Medications:    . amLODipine  10 mg Oral QHS  . enoxaparin (LOVENOX) injection  40 mg Subcutaneous Q24H  . measles, mumps & rubella vaccine  0.5 mL Subcutaneous Once  . metoprolol tartrate  100 mg Oral BID  . prenatal multivitamin  1 tablet Oral Q1200  . senna-docusate  2 tablet Oral Q24H  . Tdap  0.5 mL Intramuscular Once      Otelia Santee, MD 07/03/2019, 10:14 AM

## 2019-07-03 NOTE — Progress Notes (Signed)
Post Partum Day 4 VBAC Subjective: Patient is Spanish-speaking only, Spanish interpreter present for this encounter. Patient without complaints this morning. Patient denies any headaches, visual symptoms, RUQ/epigastric pain or other concerning symptoms. Ambulating and voiding without problems. Tolerating diet.   Objective: Blood pressure (!) 150/82, pulse 82, temperature 97.8 F (36.6 C), temperature source Oral, resp. rate 18, height 4\' 8"  (1.422 m), weight 83.8 kg, SpO2 98 %, unknown if currently breastfeeding.  Vitals:   07/03/19 0437 07/03/19 0810 07/03/19 0823 07/03/19 0845  BP: (!) 157/80 (!) 177/90 (!) 178/93 (!) 150/82  Pulse: 87 89 79 82  Resp: 18 18    Temp: 98.2 F (36.8 C) 97.8 F (36.6 C)    TempSrc: Oral Oral    SpO2: 99% 98%    Weight:      Height:       Physical Exam:  General: alert Lochia: appropriate Abd: No RUQ/epigastric pain Uterine Fundus: firm DVT Evaluation: No evidence of DVT seen on physical exam.   CBC Latest Ref Rng & Units 07/02/2019 07/01/2019 06/29/2019  WBC 4.0 - 10.5 K/uL 10.3 10.6(H) 12.9(H)  Hemoglobin 12.0 - 15.0 g/dL 10.9(L) 11.1(L) 12.9  Hematocrit 36.0 - 46.0 % 34.3(L) 34.9(L) 38.2  Platelets 150 - 400 K/uL 247 208 211   CMP Latest Ref Rng & Units 07/03/2019 07/02/2019 07/01/2019  Glucose 70 - 99 mg/dL 113(H) 219(H) 111(H)  BUN 6 - 20 mg/dL 37(H) 37(H) 41(H)  Creatinine 0.44 - 1.00 mg/dL 1.94(H) 2.09(H) 2.25(H)  Sodium 135 - 145 mmol/L 138 135 135  Potassium 3.5 - 5.1 mmol/L 4.1 4.1 3.8  Chloride 98 - 111 mmol/L 104 101 102  CO2 22 - 32 mmol/L 26 22 23   Calcium 8.9 - 10.3 mg/dL 8.6(L) 8.3(L) 7.3(L)  Total Protein 6.5 - 8.1 g/dL 5.5(L) 5.4(L) 5.2(L)  Total Bilirubin 0.3 - 1.2 mg/dL 0.3 0.3 0.2(L)  Alkaline Phos 38 - 126 U/L 101 107 114  AST 15 - 41 U/L 63(H) 50(H) 19  ALT 0 - 44 U/L 73(H) 49(H) 31   Assessment/Plan: PPD#4 VBAC CHTN with SIPEC CKD ? Type 2 DM  BP control is still an issue for patient Renal function has  improved. Appreciated nephrology seeing and any additional input for BP control. Concerned about LFTs, Labetalol discontinued. Will follow up with Pharmacy to evaluate other medications. Will need to do other workup for this transaminitis.  Doubt HELLP given normal platelets, she is s/p 24 hrs magnesium. Will follow closely. Routine postpartum care for now. Disposition plans on hold for now.   LOS: 9 days   Verita Schneiders, MD 07/03/2019, 11:35 AM

## 2019-07-03 NOTE — Progress Notes (Signed)
Faculty Practice OB/GYN Attending Note  Talked to Pharmacy earlier today about patient's elevated LFTs, they feel it is very unlikely that Labetalol is causing this as she has been on it for much longer than the past two days. They also feel her other medications are not hepatotoxic.  Discussed with Dr. Gertie Exon (MFM), who agreed on following up labs, but feels this is not consistent with HELLP/worsening PEC and does feel patient needs to have a repeat magnesium sulfate regimen.  Labs ordered for further evaluation, will also get a RUQ ultrasound tomorrow morning.  No other sentinel events for patient, and no reported concerning symptoms. She had severe range BPs this morning treated with Hydralazine and Nephrology started on new regimen of Labetalol 200 mg po bid and Procardia KL 60 mg po qd.   Patient Vitals for the past 24 hrs:  BP Temp Temp src Pulse Resp SpO2  07/03/19 1950 (!) 143/85 98.5 F (36.9 C) Oral 99 18 96 %  07/03/19 1629 (!) 143/69 98 F (36.7 C) Oral 90 18 99 %  07/03/19 1150 (!) 145/79 98.9 F (37.2 C) Oral 76 18 96 %  07/03/19 0845 (!) 150/82 - - 82 - -  07/03/19 0823 (!) 178/93 - - 79 - -  07/03/19 0810 (!) 177/90 97.8 F (36.6 C) Oral 89 18 98 %  07/03/19 0437 (!) 157/80 98.2 F (36.8 C) Oral 87 18 99 %  07/02/19 2318 (!) 159/84 - - 80 17 -  07/02/19 2302 (!) 172/82 - - 79 18 99 %    CMP Latest Ref Rng & Units 07/03/2019 07/02/2019 07/01/2019  Glucose 70 - 99 mg/dL 113(H) 219(H) 111(H)  BUN 6 - 20 mg/dL 37(H) 37(H) 41(H)  Creatinine 0.44 - 1.00 mg/dL 1.94(H) 2.09(H) 2.25(H)  Sodium 135 - 145 mmol/L 138 135 135  Potassium 3.5 - 5.1 mmol/L 4.1 4.1 3.8  Chloride 98 - 111 mmol/L 104 101 102  CO2 22 - 32 mmol/L 26 22 23   Calcium 8.9 - 10.3 mg/dL 8.6(L) 8.3(L) 7.3(L)  Total Protein 6.5 - 8.1 g/dL 5.5(L) 5.4(L) 5.2(L)  Total Bilirubin 0.3 - 1.2 mg/dL 0.3 0.3 0.2(L)  Alkaline Phos 38 - 126 U/L 101 107 114  AST 15 - 41 U/L 63(H) 50(H) 19  ALT 0 - 44 U/L 73(H) 49(H) 31    Will continue current care and follow up labs and ultrasound ordered for tomorrow.   Verita Schneiders, MD, [place] for [employer], [place]

## 2019-07-04 ENCOUNTER — Inpatient Hospital Stay (HOSPITAL_COMMUNITY): Payer: Medicaid Other

## 2019-07-04 LAB — COMPREHENSIVE METABOLIC PANEL
ALT: 70 U/L — ABNORMAL HIGH (ref 0–44)
AST: 40 U/L (ref 15–41)
Albumin: 2.4 g/dL — ABNORMAL LOW (ref 3.5–5.0)
Alkaline Phosphatase: 114 U/L (ref 38–126)
Anion gap: 13 (ref 5–15)
BUN: 31 mg/dL — ABNORMAL HIGH (ref 6–20)
CO2: 21 mmol/L — ABNORMAL LOW (ref 22–32)
Calcium: 8.8 mg/dL — ABNORMAL LOW (ref 8.9–10.3)
Chloride: 104 mmol/L (ref 98–111)
Creatinine, Ser: 2 mg/dL — ABNORMAL HIGH (ref 0.44–1.00)
GFR calc Af Amer: 38 mL/min — ABNORMAL LOW (ref >60)
GFR calc non Af Amer: 33 mL/min — ABNORMAL LOW (ref >60)
Glucose, Bld: 209 mg/dL — ABNORMAL HIGH (ref 70–99)
Potassium: 3.9 mmol/L (ref 3.5–5.1)
Sodium: 138 mmol/L (ref 135–145)
Total Bilirubin: 0.4 mg/dL (ref 0.3–1.2)
Total Protein: 6.1 g/dL — ABNORMAL LOW (ref 6.5–8.1)

## 2019-07-04 LAB — CBC WITH DIFFERENTIAL/PLATELET
Abs Immature Granulocytes: 0.05 10*3/uL (ref 0.00–0.07)
Basophils Absolute: 0 10*3/uL (ref 0.0–0.1)
Basophils Relative: 0 %
Eosinophils Absolute: 0.1 10*3/uL (ref 0.0–0.5)
Eosinophils Relative: 2 %
HCT: 33.6 % — ABNORMAL LOW (ref 36.0–46.0)
Hemoglobin: 10.8 g/dL — ABNORMAL LOW (ref 12.0–15.0)
Immature Granulocytes: 1 %
Lymphocytes Relative: 11 %
Lymphs Abs: 1 10*3/uL (ref 0.7–4.0)
MCH: 27.7 pg (ref 26.0–34.0)
MCHC: 32.1 g/dL (ref 30.0–36.0)
MCV: 86.2 fL (ref 80.0–100.0)
Monocytes Absolute: 0.6 10*3/uL (ref 0.1–1.0)
Monocytes Relative: 7 %
Neutro Abs: 7.3 10*3/uL (ref 1.7–7.7)
Neutrophils Relative %: 79 %
Platelets: 319 10*3/uL (ref 150–400)
RBC: 3.9 MIL/uL (ref 3.87–5.11)
RDW: 16.3 % — ABNORMAL HIGH (ref 11.5–15.5)
WBC: 9.2 10*3/uL (ref 4.0–10.5)
nRBC: 0 % (ref 0.0–0.2)

## 2019-07-04 LAB — GAMMA GT: GGT: 38 U/L (ref 7–50)

## 2019-07-04 LAB — DIC (DISSEMINATED INTRAVASCULAR COAGULATION)PANEL
D-Dimer, Quant: 1.56 ug/mL-FEU — ABNORMAL HIGH (ref 0.00–0.50)
Fibrinogen: 748 mg/dL — ABNORMAL HIGH (ref 210–475)
INR: 0.9 (ref 0.8–1.2)
Platelets: 322 10*3/uL (ref 150–400)
Prothrombin Time: 12.4 seconds (ref 11.4–15.2)
Smear Review: NONE SEEN
aPTT: 34 seconds (ref 24–36)

## 2019-07-04 LAB — GLUCOSE, CAPILLARY
Glucose-Capillary: 82 mg/dL (ref 70–99)
Glucose-Capillary: 97 mg/dL (ref 70–99)
Glucose-Capillary: 91 mg/dL (ref 70–99)

## 2019-07-04 MED ORDER — LABETALOL HCL 200 MG PO TABS
300.0000 mg | ORAL_TABLET | Freq: Two times a day (BID) | ORAL | Status: DC
  Administered 2019-07-04 – 2019-07-05 (×2): 300 mg via ORAL
  Filled 2019-07-04 (×2): qty 1

## 2019-07-04 NOTE — Progress Notes (Signed)
Faculty Attending Note  Post Partum Day 5 Subjective: Patient is feeling well. She reports no pain. She is ambulating and denies light-headedness or dizziness. She is passing flatus. She is tolerating a regular diet without nausea/vomiting. Bleeding is minimal. She is breast feeding. Baby is in NICU and doing well.  Objective: Blood pressure (!) 146/94, pulse 83, temperature 98.6 F (37 C), temperature source Oral, resp. rate 18, height 4\' 8"  (1.422 m), weight 83.8 kg, SpO2 99 %, unknown if currently breastfeeding. Temp:  [98 F (36.7 C)-98.6 F (37 C)] 98.6 F (37 C) (09/27 1213) Pulse Rate:  [83-99] 83 (09/27 1300) Resp:  [16-18] 18 (09/27 1213) BP: (133-176)/(69-107) 146/94 (09/27 1300) SpO2:  [96 %-100 %] 99 % (09/27 1213)  Physical Exam:  General: alert, oriented, cooperative Chest: normal respiratory effort Heart: RRR  Abdomen: soft, appropriately tender to palpation  Uterine Fundus: firm, 2 fingers below the umbilicus Lochia: moderate, rubra DVT Evaluation: no evidence of DVT Extremities: no edema, no calf tenderness  UOP: voiding spontaneously  Recent Labs    07/02/19 0954 07/04/19 0927  HGB 10.9* 10.8*  HCT 34.3* 33.6*    Assessment/Plan: Patient Active Problem List   Diagnosis Date Noted  . CKD (chronic kidney disease) 06/29/2019  . AKI (acute kidney injury) ([place]) 06/29/2019  . Nephrotic range proteinuria 06/29/2019  . Gestational diabetes 06/29/2019  . Preeclampsia, third trimester 06/24/2019  . Supervision of high risk pregnancy, antepartum 06/22/2019  . Chronic hypertension affecting pregnancy 06/22/2019  . Hypertension 07/20/2018  . SAB (spontaneous abortion) 07/20/2018  . Kidney disease 07/20/2018    Patient is 33 y.o. [id number] PPD#5 s/p VBAC at [redacted]w[redacted]d. Course c/b CKD, severe pre-eclampsia, elevated AST/ALT. BP was improving until this am, on labetalol 200 mg BID( increase to 300 mg BID today) and procardia 60 QD. Followed by nephrology, appreciate  recommendations. Cr improved down to baseline, however AST/ALT increased yesterday, will hold in house today and plan for discharge tomorrow if LFT trending down and BP appropriate.   She is doing very well with no complaints. Tearful about when she will leave hospital as she wants to see her other children but understanding of and cooperative with plan.   Continue routine post partum care Pain meds prn Regular diet Nexplanon for birth control, to [facility] on discharge, patient would like assistance in making this appointment Plan for discharge possibly tomorrow   K. Arvilla Meres, M.D. Attending Center for [employer] (Faculty Practice)  07/04/2019, 2:30 PM

## 2019-07-04 NOTE — Progress Notes (Signed)
[facility] Progress Note    Assessment/ Plan:   33 year old female in third trimester of pregnancy presenting with uncontrolled hypertension, chronic renal insufficiency, nephrotic range proteinuria and hypoalbuminemia.   1. CKD 3 with nephrotic range proteinuria, predating pregnancy, unclear etiology:Baseline Cr appears to be 1.7-1.9. Reports a history of lupus in her half-sister. UA bland without RBCs or WBCs.ANA positive, anti-SM ab negative, SSA/ SSBnegative, ENA negative.Complements WNL.HIV, hep serologies negative, RPR negative. -Her Cr has risen from 1.9-->2.67and blood pressureswerein the severe range. Her BUN has steadily increasedw/ significant proteinuria (repeatUP/C 2 g) ->24 hr urine2.27 gm.  She has AKI in pregnancy (etiology as yet undetermined; does not appear to be GN related) which could result in permanent advancement of CKD and/or progression to ESRD->Her renal functionhas finally started toequilibrate but still w/ signif dysfunction -> TOLAC-> post VBAC D2.  May take some time to stabilize; f/u with [place] in 2 weeks.    2. Chronic HTN withlikelysuperimposed pre-eclampsia:BP still not at goal.uptitratedlabetalol to 600 TID 9/19,procardia XL 30 9/19, increasedto 60 mg 9/20-> Metoprolol 100mg  BID+ Norvasc.  Changed to Labetolol 200mg  PO BID and Procardia XL 60mg  daily on 9/26  -> increase to Labetolol 300mg  BID    3. Third trimester pregnancy: 32 weeks by [place] 06/24/2019. S/p 2 doses BMZ  4. Gestational DM: on insulin  5.Dispo: remains inpt. I appreciate MFM and OB colleagues with coordination of care.  Subjective:   Denies f/c/n/v/dyspnea/HA/ visual disturbances No events overnight   Objective:   BP (!) 160/87 (BP Location: Left Arm)   Pulse 99   Temp 98.1 F (36.7 C) (Oral)   Resp 18   Ht 4\' 8"  (1.422 m)   Wt 83.8 kg   LMP  (LMP Unknown)   SpO2 100%    Breastfeeding Unknown   BMI 41.42 kg/m   Intake/Output Summary (Last 24 hours) at 07/04/2019 0909 Last data filed at 07/04/2019 0825 Gross per 24 hour  Intake 2700 ml  Output 3025 ml  Net -325 ml   Weight change:   Physical Exam: Gen:NAD,sitting on side of bed HEENT: no JVD, sclerae anicteric CVS: RRR no m/r/g Resp: clear bilaterally no c/w/r Abd:SND minimal tenderness Ext: trace L LE edema and RLE edema  Imaging: No results found.  Labs: BMET Recent Labs  Lab 06/29/19 [id number] 06/29/19 1207 06/29/19 1713 06/30/19 0509 07/01/19 0540 07/02/19 0954 07/03/19 0534  NA 132* 132* 130* 130* 135 135 138  K 4.7 4.7 5.0 4.7 3.8 4.1 4.1  CL 101 101 100 100 102 101 104  CO2 20* 21* 21* 21* 23 22 26   GLUCOSE 102* 115* 120* 107* 111* 219* 113*  BUN 44* 38* 36* 35* 41* 37* 37*  CREATININE 2.34* 2.32* 2.26* 2.22* 2.25* 2.09* 1.94*  CALCIUM 7.6* 7.6* 7.2* 7.3* 7.3* 8.3* 8.6*   CBC Recent Labs  Lab 06/27/19 1129 06/28/19 0545  06/29/19 1207 06/29/19 1728 07/01/19 0540 07/02/19 0954  WBC  --  7.2   < > 10.7* 12.9* 10.6* 10.3  NEUTROABS 5.8 5.4  --   --   --   --   --   HGB  --  12.7   < > 12.4 12.9 11.1* 10.9*  HCT  --  39.9   < > 38.5 38.2 34.9* 34.3*  MCV  --  84.5   < > 83.5 83.4 84.9 86.8  PLT  --  259   < > 248 211 208 247   < > = values in  this interval not displayed.    Medications:    . enoxaparin (LOVENOX) injection  40 mg Subcutaneous Q24H  . labetalol  200 mg Oral BID  . measles, mumps & rubella vaccine  0.5 mL Subcutaneous Once  . NIFEdipine  60 mg Oral Daily  . prenatal multivitamin  1 tablet Oral Q1200  . senna-docusate  2 tablet Oral Q24H  . Tdap  0.5 mL Intramuscular Once      Otelia Santee, MD 07/04/2019, 9:09 AM

## 2019-07-04 NOTE — Plan of Care (Signed)
BP improving overall.

## 2019-07-05 DIAGNOSIS — O119 Pre-existing hypertension with pre-eclampsia, unspecified trimester: Secondary | ICD-10-CM

## 2019-07-05 LAB — ENA+DNA/DS+ANTICH+CENTRO+JO...
Anti JO-1: <0.2 AI (ref 0.0–0.9)
Centromere Ab Screen: <0.2 AI (ref 0.0–0.9)
Chromatin Ab SerPl-aCnc: <0.2 AI (ref 0.0–0.9)
ENA SM Ab Ser-aCnc: <0.2 AI (ref 0.0–0.9)
Ribonucleic Protein: 1.6 AI — ABNORMAL HIGH (ref 0.0–0.9)
SSA (Ro) (ENA) Antibody, IgG: <0.2 AI (ref 0.0–0.9)
SSB (La) (ENA) Antibody, IgG: <0.2 AI (ref 0.0–0.9)
Scleroderma (Scl-70) (ENA) Antibody, IgG: <0.2 AI (ref 0.0–0.9)
ds DNA Ab: <1 IU/mL (ref 0–9)

## 2019-07-05 LAB — COMPREHENSIVE METABOLIC PANEL
ALT: 59 U/L — ABNORMAL HIGH (ref 0–44)
AST: 29 U/L (ref 15–41)
Albumin: 2.4 g/dL — ABNORMAL LOW (ref 3.5–5.0)
Alkaline Phosphatase: 111 U/L (ref 38–126)
Anion gap: 10 (ref 5–15)
BUN: 30 mg/dL — ABNORMAL HIGH (ref 6–20)
CO2: 22 mmol/L (ref 22–32)
Calcium: 8.9 mg/dL (ref 8.9–10.3)
Chloride: 108 mmol/L (ref 98–111)
Creatinine, Ser: 1.93 mg/dL — ABNORMAL HIGH (ref 0.44–1.00)
GFR calc Af Amer: 40 mL/min — ABNORMAL LOW (ref >60)
GFR calc non Af Amer: 34 mL/min — ABNORMAL LOW (ref >60)
Glucose, Bld: 106 mg/dL — ABNORMAL HIGH (ref 70–99)
Potassium: 4.2 mmol/L (ref 3.5–5.1)
Sodium: 140 mmol/L (ref 135–145)
Total Bilirubin: 0.4 mg/dL (ref 0.3–1.2)
Total Protein: 6.3 g/dL — ABNORMAL LOW (ref 6.5–8.1)

## 2019-07-05 LAB — GLUCOSE, CAPILLARY: Glucose-Capillary: 101 mg/dL — ABNORMAL HIGH (ref 70–99)

## 2019-07-05 LAB — ANA W/REFLEX IF POSITIVE: Anti Nuclear Antibody (ANA): POSITIVE — AB

## 2019-07-05 MED ORDER — LABETALOL HCL 300 MG PO TABS: 600.0000 mg | ORAL_TABLET | Freq: Two times a day (BID) | ORAL | 3 refills | Status: DC

## 2019-07-05 MED ORDER — LABETALOL HCL 100 MG PO TABS: 600.0000 mg | ORAL_TABLET | Freq: Two times a day (BID) | ORAL | 12 refills | Status: DC

## 2019-07-05 MED ORDER — LABETALOL HCL 300 MG PO TABS: 300.0000 mg | ORAL_TABLET | Freq: Two times a day (BID) | ORAL | 2 refills | Status: DC

## 2019-07-05 MED ORDER — NIFEDIPINE ER OSMOTIC RELEASE 60 MG PO TB24: 60.0000 mg | ORAL_TABLET | Freq: Every day | ORAL | 3 refills | Status: DC

## 2019-07-05 MED FILL — NIFEdipine ER 60 MG TB24: 60 | 30 days supply | Qty: 30 | Fill #0

## 2019-07-05 MED FILL — LABETALOL HCL 300 MG TABLET: 300 | 30 days supply | Qty: 120 | Fill #0

## 2019-07-05 NOTE — Lactation Note (Signed)
This note was copied from a baby's chart. Lactation Consultation Note  Patient Name: Renee Porter Today's Date: 07/05/2019  In house [employer] interpreter here for visit.  Baby is 79 days old in the NICU.  Mom is pumping every 3 hours and obtaining 2-3 ounces of milk.  Breasts are comfortable.  Reviewed setting pump on standard, frequency and duration of pumping.  Mom will obtain a breast pump from [facility].  She knows to refrigerate milk at home and bring to hospital in cooler.  Questions answered.  Encouraged to call for assist prn.   Maternal Data    Feeding Feeding Type: Donor Breast Milk  LATCH Score                   Interventions    Lactation Tools Discussed/Used     Consult Status      Ave Filter 07/05/2019, 10:46 AM

## 2019-07-05 NOTE — Progress Notes (Signed)
Patient discharged home with husband. Discharge teaching, home care, s/s of pre-e, and reasons to seek care discussed. Pt verbalized understanding. Spanish interpreter present for entire teaching.

## 2019-07-06 ENCOUNTER — Ambulatory Visit: Payer: Self-pay

## 2019-07-06 LAB — HEPATITIS PANEL, ACUTE
HCV Ab: <0.1 s/co ratio (ref 0.0–0.9)
Hep A IgM: NEGATIVE
Hep B C IgM: NEGATIVE
Hepatitis B Surface Ag: NEGATIVE

## 2019-07-06 NOTE — Lactation Note (Signed)
This note was copied from a baby's chart. Lactation Consultation Note  Patient Name: Boy Delinda Ramos-Gregorio [id number] Date: 07/06/2019   Spoke to [employer], parents not present in the room at this point. She told LC that mom is pumping and bringing her EBM to the NICU, she was discharged yesterday. LC called mom & dad and she told LC that they picked up the DEBP from [facility] this morning and she's pumping every 3 hours; she won't do it every 2 because "there is not enough coming out". Explained supply and demand, mom is getting at least 3 ounces (sometimes more) per pumping session; praised her for her efforts.   Per NP note baby will be transitioning to formula feedings and discontinue donor milk, baby is currently on Similac 30 calorie formula in addition to mother's milk. Reviewed breastmilk storage guidelines with mom, and instructed her to keep bringing her EBM to the NICU every time she comes. Parents aware of [place] OP services and will call contact PRN.  Maternal Data    Feeding Feeding Type: Breast Milk  LATCH Score                   Interventions    Lactation Tools Discussed/Used     Consult Status      Nature Kueker Francene Boyers 07/06/2019, 9:58 PM

## 2019-07-07 ENCOUNTER — Other Ambulatory Visit: Payer: Self-pay

## 2019-07-07 ENCOUNTER — Ambulatory Visit: Payer: Self-pay | Attending: Family Medicine | Admitting: Physician Assistant

## 2019-07-07 DIAGNOSIS — O24419 Gestational diabetes mellitus in pregnancy, unspecified control: Secondary | ICD-10-CM

## 2019-07-07 DIAGNOSIS — I10 Essential (primary) hypertension: Secondary | ICD-10-CM

## 2019-07-07 DIAGNOSIS — N189 Chronic kidney disease, unspecified: Secondary | ICD-10-CM

## 2019-07-07 DIAGNOSIS — Z09 Encounter for follow-up examination after completed treatment for conditions other than malignant neoplasm: Secondary | ICD-10-CM

## 2019-07-07 NOTE — Progress Notes (Signed)
Virtual Visit via Telephone Note  I connected with Renee Porter on 07/07/19 at  1:50 PM EDT by telephone and verified that I am speaking with the correct person using two identifiers.   I discussed the limitations, risks, security and privacy concerns of performing an evaluation and management service by telephone and the availability of in person appointments. I also discussed with the patient that there may be a patient responsible charge related to this service. The patient expressed understanding and agreed to proceed.  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 She was [redacted] weeks pregnant and had successful VBAC. She had prediabetes and nephrology consult due to renal disease.  She has a nephrology follow-up appt.  She will need PCP care from [place] with following BP, blood sugars, etc.  She and the infant are now at home and doing ok.  No HA/CP/SOB.   Observations/Objective: NAD.  A&Ox3   Assessment and Plan: 1. 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  Now on labetelol and nifedipine.  Continue both.  Check BP 2-3 times weekly OOO and record and bring to next visit  2. Chronic kidney disease, unspecified CKD stage Has f/up appt with nephrology   3. Gestational diabetes mellitus (GDM), antepartum, gestational diabetes method of control unspecified Will follow blood sugars and A1C-expect improvement s/p delivery as trending normal at discharge  4. Hospital discharge follow-up Doing well.  No complaints today No CP/SOB   Follow Up Instructions: Assign PCP 3-4 weeks   I discussed the assessment and treatment plan with the patient. The patient was provided an opportunity to ask questions and all were answered. The patient agreed with the plan and demonstrated an understanding of the  instructions.   The patient was advised to call back or seek an in-person evaluation if the symptoms worsen or if the condition fails to improve as anticipated.  I provided 13 minutes of non-face-to-face time during this encounter.   Freeman Caldron, PA-C  Patient ID: Renee Porter, female   DOB: [birth year], 33 y.o.   MRN: [id number]

## 2019-07-08 ENCOUNTER — Ambulatory Visit (INDEPENDENT_AMBULATORY_CARE_PROVIDER_SITE_OTHER): Payer: Self-pay

## 2019-07-08 ENCOUNTER — Encounter: Payer: Self-pay | Admitting: Obstetrics & Gynecology

## 2019-07-08 VITALS — BP 131/86 | HR 87 | Wt 168.1 lb

## 2019-07-08 DIAGNOSIS — I10 Essential (primary) hypertension: Secondary | ICD-10-CM

## 2019-07-08 NOTE — Progress Notes (Signed)
Pt presents to office for BP check following vaginal delivery on 06/29/19. Pt has history of chronic hypertension and reports taking her BP medications as prescribed. BP today 133/88 and 131/86. Hx and today's BP reviewed with Gala Romney, MD; provider would not like to make any changes at this time.   Discussed provider's recommendations and follow up appts with patient. Pt will return to office for BP check on 10/7. Pt verbalizes understanding and has no other concerns for this time.   [employer] interpreter Claudia Desanctis [phone] used for appt.  Apolonio Schneiders RN 07/08/19

## 2019-07-11 NOTE — Progress Notes (Signed)
Patient seen and assessed by nursing staff during this encounter. I have reviewed the chart and agree with the documentation and plan.  Silas [place], MD 07/11/2019 11:45 AM

## 2019-07-12 ENCOUNTER — Other Ambulatory Visit: Payer: Self-pay | Admitting: *Deleted

## 2019-07-12 DIAGNOSIS — Z8632 Personal history of gestational diabetes: Secondary | ICD-10-CM

## 2019-07-13 ENCOUNTER — Telehealth: Payer: Self-pay | Admitting: General Practice

## 2019-07-13 NOTE — Telephone Encounter (Signed)
Received phone call from patient's daughter to call her mother back with a spanish interpreter.   Called patient with Renee Porter for interpreter stating we are returning her phone call. Patient states she has an appt tomorrow and has been coughing some & has a tickle in her throat/throat irritation. Asked patient how frequently she is coughing and if she has other symptoms. Patient states she thinks she has allergies and she is only coughing once or twice a day. Told patient if she is only coughing once or twice that is fine she can come in for her appt tomorrow. If she is actively coughing throughout the day, her appt will need to be rescheduled. Patient verbalized understanding & had no questions.

## 2019-07-14 ENCOUNTER — Ambulatory Visit (INDEPENDENT_AMBULATORY_CARE_PROVIDER_SITE_OTHER): Payer: Self-pay

## 2019-07-14 ENCOUNTER — Other Ambulatory Visit: Payer: Self-pay

## 2019-07-14 ENCOUNTER — Ambulatory Visit: Payer: Self-pay

## 2019-07-14 DIAGNOSIS — Z0130 Encounter for examination of blood pressure without abnormal findings: Secondary | ICD-10-CM

## 2019-07-14 DIAGNOSIS — Z8632 Personal history of gestational diabetes: Secondary | ICD-10-CM

## 2019-07-14 NOTE — Progress Notes (Signed)
Pt here for today for BP check and 2 hr GTT. Pt denies any blurry vision, dizziness, or headache. Pt reports taking all BP meds as prescribed. BP today 121/83. Discussed upcoming PCP appt to establish care on 10/26.  Reviewed with Kerry Hough, PA who recommends no changes to medication at this time and pt to follow up at [facility] visit on 11/4. Discussed provider's recommendation with pt. Pt educated to call our office if she experiences any dizziness, headache, or blurry vision before next appt. Also, if pt takes her blood pressure at home and receives a reading higher than 140/90 she needs to call the office.  Pt reports an occasional cough and is asking what medicine she may take. Recommended robitussin over the counter.  Pt verbalizes understanding and has no further questions.   Apolonio Schneiders RN 07/14/19

## 2019-07-14 NOTE — Progress Notes (Signed)
Patient seen and assessed by nursing staff during this encounter. I have reviewed the chart and agree with the documentation and plan.  Kerry Hough, PA-C 07/14/2019 10:39 AM

## 2019-07-15 ENCOUNTER — Telehealth: Payer: Self-pay

## 2019-07-15 LAB — GLUCOSE TOLERANCE, 2 HOURS
Glucose, 2 hour: 124 mg/dL (ref 65–139)
Glucose, GTT - Fasting: 126 mg/dL — ABNORMAL HIGH (ref 65–99)

## 2019-07-15 NOTE — Telephone Encounter (Signed)
-----   Message from Donnamae Jude, MD sent at 07/15/2019 11:35 AM EDT ----- She appears to still be diabetic--please refer to PCP

## 2019-07-15 NOTE — Telephone Encounter (Signed)
Called patient to inform her of test results using Spanish interpreter( Racquel) to inform her of diabetes and the need to follow up with pcp.

## 2019-07-15 NOTE — Telephone Encounter (Signed)
Opened in Error.

## 2019-07-19 ENCOUNTER — Ambulatory Visit: Payer: Self-pay

## 2019-08-02 ENCOUNTER — Ambulatory Visit: Payer: Self-pay | Attending: Nurse Practitioner | Admitting: Nurse Practitioner

## 2019-08-02 ENCOUNTER — Encounter: Payer: Self-pay | Admitting: Nurse Practitioner

## 2019-08-02 ENCOUNTER — Other Ambulatory Visit: Payer: Self-pay

## 2019-08-02 DIAGNOSIS — N189 Chronic kidney disease, unspecified: Secondary | ICD-10-CM

## 2019-08-02 DIAGNOSIS — I10 Essential (primary) hypertension: Secondary | ICD-10-CM

## 2019-08-02 MED ORDER — LABETALOL HCL 300 MG PO TABS: 600.0000 mg | ORAL_TABLET | Freq: Two times a day (BID) | ORAL | 3 refills | Status: DC

## 2019-08-02 MED ORDER — NIFEDIPINE ER OSMOTIC RELEASE 60 MG PO TB24: 60.0000 mg | ORAL_TABLET | Freq: Every day | ORAL | 3 refills | Status: DC

## 2019-08-02 NOTE — Progress Notes (Signed)
Virtual Visit via Telephone Note Due to national recommendations of social distancing due to Renee Porter, telehealth visit is felt to be most appropriate for this patient at this time.  I discussed the limitations, risks, security and privacy concerns of performing an evaluation and management service by telephone and the availability of in person appointments. I also discussed with the patient that there may be a patient responsible charge related to this service. The patient expressed understanding and agreed to proceed.    I connected with Renee Porter on 08/02/19  at   3:30 PM EDT  EDT by telephone and verified that I am speaking with the correct person using two identifiers.   Consent I discussed the limitations, risks, security and privacy concerns of performing an evaluation and management service by telephone and the availability of in person appointments. I also discussed with the patient that there may be a patient responsible charge related to this service. The patient expressed understanding and agreed to proceed.   Location of Patient: Private  Residence   Location of Provider: [place] [facility] [place] [facility]    Persons participating in Telemedicine visit: Geryl Rankins FNP-BC Elroy Porter  Spanish Interpreter ID # [id number]    History of Present Illness: Telemedicine visit for: Establish Care  has a past medical history of Hypertension and Prediabetes.     Essential Hypertension She does not monitor her blood pressure at home. She recently had a baby boy 1 month ago. Currently breastfeeding so will leave her on the current medications: labetalol 600 mg BID, nifedipine 60 mg daily. Denies chest pain, shortness of breath, palpitations, lightheadedness, dizziness, headaches or BLE edema.  BP Readings from Last 3 Encounters:  07/08/19 131/86  07/05/19 (!) 169/95  06/24/19 (!) 199/142   Prediabetic Controlled with weight and diet. She does  not monitor her blood glucose levels at home. Denies any hypo or hyperglycemic symptoms.  Lab Results  Component Value Date   HGBA1C 6.1 (H) 06/24/2019      Past Medical History:  Diagnosis Date  . Hypertension   . Prediabetes     Past Surgical History:  Procedure Laterality Date  . CESAREAN SECTION    . FOOT SURGERY      Family History  Problem Relation Age of Onset  . Diabetes Father     Social History   Socioeconomic History  . Marital status: Single    Spouse name: Not on file  . Number of children: Not on file  . Years of education: Not on file  . Highest education level: Not on file  Occupational History  . Not on file  Social Needs  . Financial resource strain: Not on file  . Food insecurity    Worry: Never true    Inability: Never true  . Transportation needs    Medical: No    Non-medical: No  Tobacco Use  . Smoking status: Never Smoker  . Smokeless tobacco: Never Used  Substance and Sexual Activity  . Alcohol use: Never    Frequency: Never  . Drug use: Never  . Sexual activity: Not Currently    Birth control/protection: None  Lifestyle  . Physical activity    Days per week: Not on file    Minutes per session: Not on file  . Stress: Not on file  Relationships  . Social Herbalist on phone: Not on file    Gets together: Not on file    Attends religious  service: Not on file    Active member of club or organization: Not on file    Attends meetings of clubs or organizations: Not on file    Relationship status: Not on file  Other Topics Concern  . Not on file  Social History Narrative  . Not on file     Observations/Objective: Awake, alert and oriented x 3   Review of Systems  Constitutional: Negative for fever, malaise/fatigue and weight loss.  HENT: Negative.  Negative for nosebleeds.   Eyes: Negative.  Negative for blurred vision, double vision and photophobia.  Respiratory: Negative.  Negative for cough and shortness of  breath.   Cardiovascular: Negative.  Negative for chest pain, palpitations and leg swelling.  Gastrointestinal: Negative.  Negative for heartburn, nausea and vomiting.  Musculoskeletal: Negative.  Negative for myalgias.  Neurological: Negative.  Negative for dizziness, focal weakness, seizures and headaches.  Psychiatric/Behavioral: Negative.  Negative for suicidal ideas.    Assessment and Plan: Diagnoses and all orders for this visit:  Essential hypertension -     NIFEdipine (PROCARDIA XL/NIFEDICAL XL) 60 MG 24 hr tablet; Take 1 tablet (60 mg total) by mouth daily. -     labetalol (NORMODYNE) 300 MG tablet; Take 2 tablets (600 mg total) by mouth 2 (two) times daily. Continue all antihypertensives as prescribed.  Remember to bring in your blood pressure log with you for your follow up appointment.  DASH/Mediterranean Diets are healthier choices for HTN.    Chronic kidney disease, unspecified CKD stage Continue follow up with Nephrologist as instructed.   Currently stable.   Follow Up Instructions Return in about 3 months (around 11/02/2019).     I discussed the assessment and treatment plan with the patient. The patient was provided an opportunity to ask questions and all were answered. The patient agreed with the plan18 and demonstrated an understanding of the instructions.   The patient was advised to call back or seek an in-person evaluation if the symptoms worsen or if the condition fails to improve as anticipated.  I provided 18 minutes of non-face-to-face time during this encounter including median intraservice time, reviewing previous notes, labs, imaging, medications and explaining diagnosis and management.  [name], FNP-BC

## 2019-08-04 MED FILL — NIFEdipine ER 60 MG TB24: 60 | 30 days supply | Qty: 30 | Fill #0

## 2019-08-04 MED FILL — LABETALOL HCL 300 MG TABS: 300 | 30 days supply | Qty: 120 | Fill #0

## 2019-08-09 ENCOUNTER — Encounter: Payer: Self-pay | Admitting: Nurse Practitioner

## 2019-08-11 ENCOUNTER — Other Ambulatory Visit: Payer: Self-pay

## 2019-08-11 ENCOUNTER — Ambulatory Visit: Payer: Self-pay | Admitting: Family Medicine

## 2019-08-11 NOTE — Progress Notes (Signed)
Subjective:     Renee Porter is a 33 y.o. female who presents for a postpartum visit. She is 6 weeks 1 day postpartum following a spontaneous vaginal delivery. I have fully reviewed the prenatal and intrapartum course. The delivery was at 60 5/7 gestational weeks. Outcome: spontaneous vaginal delivery. Anesthesia: epidural. Postpartum course has been normal. Baby's course has been complicated by NICu stay. Baby is feeding by both breast and bottle - Similac Neosure. Bleeding staining only. Bowel function is normal. Bladder function is normal. Patient is not sexually active. Contraception method is Nexplanon; placed 07/27/19 at [facility]. Postpartum depression screening: negative.  The following portions of the patient's history were reviewed and updated as appropriate: allergies, current medications, past family history, past medical history, past social history, past surgical history and problem list.  Review of Systems Pertinent items noted in HPI and remainder of comprehensive ROS otherwise negative.   Objective:    LMP 07/19/2019   General:  alert, cooperative and appears stated age  Lungs: clear to auscultation bilaterally  Heart:  regular rate and rhythm  Abdomen: soft, non-tender; bowel sounds normal; no masses,  no organomegaly        Assessment:     Postpartum exam. Pap smear not done at today's visit.   Plan:    1. Contraception: Nexplanon at [facility] and pap done there as well. 2.  Hs PCP to f/u DM 3. Has seen nephrology 4. Follow up in: 1 year or as needed.

## 2019-09-06 MED FILL — NIFEdipine ER 60 MG TB24: 60 | 30 days supply | Qty: 30 | Fill #1

## 2019-09-06 MED FILL — LABETALOL HCL 300 MG TABS: 300 | 30 days supply | Qty: 120 | Fill #1

## 2019-09-08 ENCOUNTER — Ambulatory Visit: Payer: Self-pay | Attending: Nurse Practitioner

## 2019-09-08 ENCOUNTER — Other Ambulatory Visit: Payer: Self-pay

## 2019-09-20 ENCOUNTER — Other Ambulatory Visit: Payer: Self-pay | Admitting: Nephrology

## 2019-09-20 DIAGNOSIS — R748 Abnormal levels of other serum enzymes: Secondary | ICD-10-CM

## 2019-10-18 MED FILL — NIFEdipine ER 60 MG TB24: 60 | 30 days supply | Qty: 30 | Fill #2

## 2019-10-18 MED FILL — LABETALOL HCL 300 MG TABS: 300 | 30 days supply | Qty: 120 | Fill #2

## 2019-11-03 ENCOUNTER — Ambulatory Visit: Payer: Self-pay | Admitting: Nurse Practitioner

## 2019-11-15 ENCOUNTER — Other Ambulatory Visit: Payer: Self-pay

## 2019-11-15 ENCOUNTER — Ambulatory Visit: Payer: Self-pay | Attending: Nurse Practitioner | Admitting: Nurse Practitioner

## 2019-11-15 ENCOUNTER — Encounter: Payer: Self-pay | Admitting: Nurse Practitioner

## 2019-11-15 VITALS — BP 113/70 | HR 89 | Temp 97.5°F | Wt 172.0 lb

## 2019-11-15 DIAGNOSIS — I10 Essential (primary) hypertension: Secondary | ICD-10-CM

## 2019-11-15 DIAGNOSIS — N189 Chronic kidney disease, unspecified: Secondary | ICD-10-CM

## 2019-11-15 DIAGNOSIS — R7303 Prediabetes: Secondary | ICD-10-CM

## 2019-11-15 LAB — GLUCOSE, POCT (MANUAL RESULT ENTRY): POC Glucose: 165 mg/dl — AB (ref 70–99)

## 2019-11-15 NOTE — Progress Notes (Signed)
Assessment & Plan:  Renee Porter was seen today for follow-up.  Diagnoses and all orders for this visit:  Essential hypertension -     CMP14+EGFR Continue all antihypertensives as prescribed.  Remember to bring in your blood pressure log with you for your follow up appointment.  DASH/Mediterranean Diets are healthier choices for HTN.    Chronic kidney disease, unspecified CKD stage -     CBC -     CMP14+EGFR Avoid NSAID, ibuprofen, aleve, advil. Motrin, naproxen   Prediabetes -     Hemoglobin A1c -     Lipid panel -     TSH -     Glucose (CBG) -     Cancel: Microalbumin/Creatinine Ratio, Urine Continue blood sugar control as discussed in office today, low carbohydrate diet, and regular physical exercise as tolerated, 150 minutes per week (30 min each day, 5 days per week, or 50 min 3 days per week).   Patient has been counseled on age-appropriate routine health concerns for screening and prevention. These are reviewed and up-to-date. Referrals have been placed accordingly. Immunizations are up-to-date or declined.    Subjective:   Chief Complaint  Patient presents with  . Follow-up    Pt. is here for 3 months follow up.    HPI Renee Porter 34 y.o. female presents to office today for follow up.  has a past medical history of Hypertension and Prediabetes.  VRI was used to communicate directly with patient for the entire encounter including providing detailed patient instructions.   Essential Hypertension Well controlled. She is currently taking labetalol 600 mg BID and nifedipine 60 mg daily. She takes nifedipine at night as it tends to cause transient dizziness when she takes it during the day. Denies chest pain, shortness of breath, palpitations, lightheadedness, dizziness, headaches or BLE edema. She has CKD and is currently seeing nephrology Dr. Hollie Salk. She states she has a biopsy pending.  BP Readings from Last 3 Encounters:  11/15/19 113/70  08/11/19 109/66  07/08/19  131/86   Prediabetes   Currently diet controlled. History of gestational diabetes.  Lab Results  Component Value Date   HGBA1C 6.1 (H) 06/24/2019   Review of Systems  Constitutional: Negative for fever, malaise/fatigue and weight loss.  HENT: Negative.  Negative for nosebleeds.   Eyes: Negative.  Negative for blurred vision, double vision and photophobia.  Respiratory: Negative.  Negative for cough and shortness of breath.   Cardiovascular: Negative.  Negative for chest pain, palpitations and leg swelling.  Gastrointestinal: Negative.  Negative for heartburn, nausea and vomiting.  Musculoskeletal: Negative.  Negative for myalgias.  Neurological: Negative.  Negative for dizziness, focal weakness, seizures and headaches.  Psychiatric/Behavioral: Negative.  Negative for suicidal ideas.    Past Medical History:  Diagnosis Date  . Hypertension   . Prediabetes     Past Surgical History:  Procedure Laterality Date  . CESAREAN SECTION    . FOOT SURGERY      Family History  Problem Relation Age of Onset  . Diabetes Father     Social History Reviewed with no changes to be made today.   Outpatient Medications Prior to Visit  Medication Sig Dispense Refill  . labetalol (NORMODYNE) 300 MG tablet Take 2 tablets (600 mg total) by mouth 2 (two) times daily. 60 tablet 3  . NIFEdipine (PROCARDIA XL/NIFEDICAL XL) 60 MG 24 hr tablet Take 1 tablet (60 mg total) by mouth daily. 30 tablet 3  . Prenatal Vit-Fe Fumarate-FA (PRENATAL VITAMINS PO) Take  1 tablet by mouth daily after lunch.      No facility-administered medications prior to visit.    No Known Allergies     Objective:    BP 113/70 (BP Location: Left Arm, Patient Position: Sitting, Cuff Size: Normal)   Pulse 89   Temp (!) 97.5 F (36.4 C) (Temporal)   Wt 172 lb (78 kg)   SpO2 100%   Breastfeeding Yes   BMI 38.56 kg/m  Wt Readings from Last 3 Encounters:  11/15/19 172 lb (78 kg)  08/11/19 164 lb (74.4 kg)  07/08/19  168 lb 1.6 oz (76.2 kg)    Physical Exam Vitals and nursing note reviewed.  Constitutional:      Appearance: She is well-developed.  HENT:     Head: Normocephalic and atraumatic.  Cardiovascular:     Rate and Rhythm: Normal rate and regular rhythm.     Heart sounds: Normal heart sounds. No murmur. No friction rub. No gallop.   Pulmonary:     Effort: Pulmonary effort is normal. No tachypnea or respiratory distress.     Breath sounds: Normal breath sounds. No decreased breath sounds, wheezing, rhonchi or rales.  Chest:     Chest wall: No tenderness.  Abdominal:     General: Bowel sounds are normal.     Palpations: Abdomen is soft.  Musculoskeletal:        General: Normal range of motion.     Cervical back: Normal range of motion.  Skin:    General: Skin is warm and dry.  Neurological:     Mental Status: She is alert and oriented to person, place, and time.     Coordination: Coordination normal.  Psychiatric:        Behavior: Behavior normal. Behavior is cooperative.        Thought Content: Thought content normal.        Judgment: Judgment normal.          Patient has been counseled extensively about nutrition and exercise as well as the importance of adherence with medications and regular follow-up. The patient was given clear instructions to go to ER or return to medical center if symptoms don't improve, worsen or new problems develop. The patient verbalized understanding.   Follow-up: Return in about 3 months (around 02/12/2020).   [name], FNP-BC [facility] and [place], [place]   11/15/2019, 2:51 PM

## 2019-11-16 ENCOUNTER — Other Ambulatory Visit: Payer: Self-pay | Admitting: Nephrology

## 2019-11-16 LAB — CMP14+EGFR
ALT: 26 IU/L (ref 0–32)
AST: 16 IU/L (ref 0–40)
Albumin/Globulin Ratio: 1.3 (ref 1.2–2.2)
Albumin: 4.1 g/dL (ref 3.8–4.8)
Alkaline Phosphatase: 196 IU/L — ABNORMAL HIGH (ref 39–117)
BUN/Creatinine Ratio: 17 (ref 9–23)
BUN: 36 mg/dL — ABNORMAL HIGH (ref 6–20)
Bilirubin Total: <0.2 mg/dL (ref 0.0–1.2)
CO2: 19 mmol/L — ABNORMAL LOW (ref 20–29)
Calcium: 9.4 mg/dL (ref 8.7–10.2)
Chloride: 108 mmol/L — ABNORMAL HIGH (ref 96–106)
Creatinine, Ser: 2.16 mg/dL — ABNORMAL HIGH (ref 0.57–1.00)
GFR calc Af Amer: 34 mL/min/{1.73_m2} — ABNORMAL LOW (ref >59)
GFR calc non Af Amer: 30 mL/min/{1.73_m2} — ABNORMAL LOW (ref >59)
Globulin, Total: 3.2 g/dL (ref 1.5–4.5)
Glucose: 98 mg/dL (ref 65–99)
Potassium: 5.2 mmol/L (ref 3.5–5.2)
Sodium: 141 mmol/L (ref 134–144)
Total Protein: 7.3 g/dL (ref 6.0–8.5)

## 2019-11-16 LAB — CBC
Hematocrit: 35.1 % (ref 34.0–46.6)
Hemoglobin: 11.4 g/dL (ref 11.1–15.9)
MCH: 26.9 pg (ref 26.6–33.0)
MCHC: 32.5 g/dL (ref 31.5–35.7)
MCV: 83 fL (ref 79–97)
Platelets: 391 10*3/uL (ref 150–450)
RBC: 4.24 x10E6/uL (ref 3.77–5.28)
RDW: 13.8 % (ref 11.7–15.4)
WBC: 8 10*3/uL (ref 3.4–10.8)

## 2019-11-16 LAB — TSH: TSH: 1.35 u[IU]/mL (ref 0.450–4.500)

## 2019-11-16 LAB — LIPID PANEL
Chol/HDL Ratio: 5.6 ratio — ABNORMAL HIGH (ref 0.0–4.4)
Cholesterol, Total: 223 mg/dL — ABNORMAL HIGH (ref 100–199)
HDL: 40 mg/dL (ref >39)
LDL Chol Calc (NIH): 151 mg/dL — ABNORMAL HIGH (ref 0–99)
Triglycerides: 173 mg/dL — ABNORMAL HIGH (ref 0–149)
VLDL Cholesterol Cal: 32 mg/dL (ref 5–40)

## 2019-11-16 LAB — HEMOGLOBIN A1C
Est. average glucose Bld gHb Est-mCnc: 117 mg/dL
Hgb A1c MFr Bld: 5.7 % — ABNORMAL HIGH (ref 4.8–5.6)

## 2019-11-17 ENCOUNTER — Other Ambulatory Visit (HOSPITAL_COMMUNITY): Payer: Self-pay | Admitting: Nephrology

## 2019-11-17 DIAGNOSIS — N1832 Chronic kidney disease, stage 3b: Secondary | ICD-10-CM

## 2019-11-22 ENCOUNTER — Ambulatory Visit
Admission: RE | Admit: 2019-11-22 | Discharge: 2019-11-22 | Disposition: A | Discharge status: Home / Self Care | Payer: No Typology Code available for payment source | Source: Ambulatory Visit | Attending: Nephrology | Admitting: Nephrology

## 2019-11-22 DIAGNOSIS — R748 Abnormal levels of other serum enzymes: Secondary | ICD-10-CM

## 2019-11-23 ENCOUNTER — Telehealth: Payer: Self-pay | Admitting: Nurse Practitioner

## 2019-11-23 DIAGNOSIS — I10 Essential (primary) hypertension: Secondary | ICD-10-CM

## 2019-11-23 MED ORDER — LABETALOL HCL 300 MG PO TABS: 600.0000 mg | ORAL_TABLET | Freq: Two times a day (BID) | ORAL | 3 refills | Status: DC

## 2019-11-23 MED ORDER — NIFEDIPINE ER OSMOTIC RELEASE 60 MG PO TB24: 60.0000 mg | ORAL_TABLET | Freq: Every day | ORAL | 3 refills | Status: DC

## 2019-11-23 MED FILL — LABETALOL HCL 300 MG TABS: 300 | 60 days supply | Qty: 60 | Fill #0

## 2019-11-23 MED FILL — NIFEdipine ER 60 MG TB24: 60 | 30 days supply | Qty: 30 | Fill #0

## 2019-11-23 NOTE — Telephone Encounter (Signed)
Rx sent 

## 2019-11-23 NOTE — Telephone Encounter (Signed)
1) Medication(s) Requested (by name): -labetalol (NORMODYNE) 300 MG tablet  -NIFEdipine (PROCARDIA XL/NIFEDICAL XL) 60 MG 24 hr tablet   2) Pharmacy of Choice: [facility]

## 2019-11-25 ENCOUNTER — Other Ambulatory Visit: Payer: Self-pay | Admitting: Radiology

## 2019-11-26 ENCOUNTER — Other Ambulatory Visit: Payer: Self-pay | Admitting: Physician Assistant

## 2019-11-29 ENCOUNTER — Encounter (HOSPITAL_COMMUNITY): Payer: Self-pay

## 2019-11-29 ENCOUNTER — Ambulatory Visit (HOSPITAL_COMMUNITY)
Admission: RE | Admit: 2019-11-29 | Discharge: 2019-11-29 | Disposition: A | Discharge status: Home / Self Care | Payer: Self-pay | Source: Ambulatory Visit | Attending: Nephrology | Admitting: Nephrology

## 2019-11-29 ENCOUNTER — Other Ambulatory Visit: Payer: Self-pay

## 2019-11-29 DIAGNOSIS — R809 Proteinuria, unspecified: Secondary | ICD-10-CM | POA: Insufficient documentation

## 2019-11-29 DIAGNOSIS — N1832 Chronic kidney disease, stage 3b: Secondary | ICD-10-CM

## 2019-11-29 DIAGNOSIS — I129 Hypertensive chronic kidney disease with stage 1 through stage 4 chronic kidney disease, or unspecified chronic kidney disease: Secondary | ICD-10-CM | POA: Insufficient documentation

## 2019-11-29 DIAGNOSIS — Z79899 Other long term (current) drug therapy: Secondary | ICD-10-CM | POA: Insufficient documentation

## 2019-11-29 DIAGNOSIS — Z833 Family history of diabetes mellitus: Secondary | ICD-10-CM | POA: Insufficient documentation

## 2019-11-29 DIAGNOSIS — N189 Chronic kidney disease, unspecified: Secondary | ICD-10-CM | POA: Insufficient documentation

## 2019-11-29 LAB — PROTIME-INR
INR: 1 (ref 0.8–1.2)
Prothrombin Time: 12.9 seconds (ref 11.4–15.2)

## 2019-11-29 LAB — CBC
HCT: 37.2 % (ref 36.0–46.0)
Hemoglobin: 11.8 g/dL — ABNORMAL LOW (ref 12.0–15.0)
MCH: 26.4 pg (ref 26.0–34.0)
MCHC: 31.7 g/dL (ref 30.0–36.0)
MCV: 83.2 fL (ref 80.0–100.0)
Platelets: 357 10*3/uL (ref 150–400)
RBC: 4.47 MIL/uL (ref 3.87–5.11)
RDW: 13.7 % (ref 11.5–15.5)
WBC: 8.4 10*3/uL (ref 4.0–10.5)
nRBC: 0 % (ref 0.0–0.2)

## 2019-11-29 MED ORDER — MIDAZOLAM HCL 2 MG/2ML IJ SOLN
INTRAMUSCULAR | Status: AC | PRN
  Administered 2019-11-29: 1 mg via INTRAVENOUS

## 2019-11-29 MED ORDER — SODIUM CHLORIDE 0.9 % IV SOLN
INTRAVENOUS | Status: AC | PRN
  Administered 2019-11-29: 10 mL/h via INTRAVENOUS

## 2019-11-29 MED ORDER — LIDOCAINE HCL (PF) 1 % IJ SOLN
INTRAMUSCULAR | Status: AC
  Filled 2019-11-29: qty 30

## 2019-11-29 MED ORDER — MIDAZOLAM HCL 2 MG/2ML IJ SOLN
INTRAMUSCULAR | Status: AC
  Filled 2019-11-29: qty 2

## 2019-11-29 MED ORDER — GELATIN ABSORBABLE 12-7 MM EX MISC
CUTANEOUS | Status: AC
  Filled 2019-11-29: qty 1

## 2019-11-29 MED ORDER — FENTANYL CITRATE (PF) 100 MCG/2ML IJ SOLN
INTRAMUSCULAR | Status: AC
  Filled 2019-11-29: qty 2

## 2019-11-29 MED ORDER — FENTANYL CITRATE (PF) 100 MCG/2ML IJ SOLN
INTRAMUSCULAR | Status: AC | PRN
  Administered 2019-11-29: 50 ug via INTRAVENOUS

## 2019-11-29 MED ORDER — SODIUM CHLORIDE 0.9 % IV SOLN: INTRAVENOUS | Status: DC

## 2019-11-29 NOTE — H&P (Signed)
Chief Complaint: Patient was seen in consultation today for random renal biopsy at the request of [place]  Referring Physician(s): Upton,Elizabeth  Supervising Physician: Corrie Mckusick  Patient Status: [facility] - Out-pt  History of Present Illness: Renee Porter is a 34 y.o. female   CKD HTN Proteinuria Referred to Dr Hollie Salk for evaluation  Now scheduled for random renal biopsy   Past Medical History:  Diagnosis Date  . Hypertension   . Prediabetes     Past Surgical History:  Procedure Laterality Date  . CESAREAN SECTION    . FOOT SURGERY      Allergies: Patient has no known allergies.  Medications: Prior to Admission medications   Medication Sig Start Date End Date Taking? Authorizing Provider  acetaminophen (TYLENOL) 325 MG tablet Take 325 mg by mouth every 6 (six) hours as needed for moderate pain or headache.   Yes [provider]  labetalol (NORMODYNE) 300 MG tablet Take 2 tablets (600 mg total) by mouth 2 (two) times daily. 11/23/19  Yes Charlott Rakes, MD  NIFEdipine (PROCARDIA XL/NIFEDICAL XL) 60 MG 24 hr tablet Take 1 tablet (60 mg total) by mouth daily. Patient taking differently: Take 60 mg by mouth every evening.  11/23/19  Yes Charlott Rakes, MD  Prenatal Vit-Fe Fumarate-FA (PRENATAL VITAMINS PO) Take 1 tablet by mouth daily in the afternoon.    Yes [provider]     Family History  Problem Relation Age of Onset  . Diabetes Father     Social History   Socioeconomic History  . Marital status: Single    Spouse name: Not on file  . Number of children: Not on file  . Years of education: Not on file  . Highest education level: Not on file  Occupational History  . Not on file  Tobacco Use  . Smoking status: Never Smoker  . Smokeless tobacco: Never Used  Substance and Sexual Activity  . Alcohol use: Never  . Drug use: Never  . Sexual activity: Not Currently    Birth control/protection: None  Other Topics  Concern  . Not on file  Social History Narrative  . Not on file   Social Determinants of Health   Financial Resource Strain:   . Difficulty of Paying Living Expenses: Not on file  Food Insecurity: No Food Insecurity  . Worried About Charity fundraiser in the Last Year: Never true  . Ran Out of Food in the Last Year: Never true  Transportation Needs: No Transportation Needs  . Lack of Transportation (Medical): No  . Lack of Transportation (Non-Medical): No  Physical Activity:   . Days of Exercise per Week: Not on file  . Minutes of Exercise per Session: Not on file  Stress:   . Feeling of Stress : Not on file  Social Connections:   . Frequency of Communication with Friends and Family: Not on file  . Frequency of Social Gatherings with Friends and Family: Not on file  . Attends Religious Services: Not on file  . Active Member of Clubs or Organizations: Not on file  . Attends Archivist Meetings: Not on file  . Marital Status: Not on file     Review of Systems: A 12 point ROS discussed and pertinent positives are indicated in the HPI above.  All other systems are negative.  Review of Systems  Constitutional: Negative for activity change, fatigue and fever.  Respiratory: Negative for cough and shortness of breath.   Gastrointestinal: Negative for abdominal  pain.  Neurological: Negative for weakness.  Psychiatric/Behavioral: Negative for behavioral problems and confusion.    Vital Signs: BP 109/72   Pulse 79   Temp 97.7 F (36.5 C) (Oral)   Resp 16   Ht 4\' 9"  (1.448 m)   Wt 170 lb (77.1 kg)   BMI 36.79 kg/m   Physical Exam Vitals reviewed.  Cardiovascular:     Rate and Rhythm: Normal rate and regular rhythm.     Heart sounds: Normal heart sounds.  Pulmonary:     Effort: Pulmonary effort is normal.     Breath sounds: Normal breath sounds.  Abdominal:     Palpations: Abdomen is soft.  Musculoskeletal:        General: Normal range of motion.  Skin:     General: Skin is warm and dry.  Neurological:     Mental Status: She is alert and oriented to person, place, and time.  Psychiatric:        Behavior: Behavior normal.        Thought Content: Thought content normal.        Judgment: Judgment normal.     Comments: Interpreter Graciella in room at bedside Consented through interpreter     Imaging: US Abdomen Complete  Result Date: 11/22/2019 CLINICAL DATA:  Elevated liver enzymes EXAM: ABDOMEN ULTRASOUND COMPLETE COMPARISON:  Ultrasound right upper quadrant July 04, 2019 FINDINGS: Gallbladder: No gallstones or wall thickening visualized. There is no pericholecystic fluid. No sonographic Murphy sign noted by sonographer. Common bile duct: Diameter: 4 mm. No intrahepatic, common hepatic, or common bile duct dilatation. Liver: No focal lesion identified. Within normal limits in parenchymal echogenicity. Portal vein is patent on color Doppler imaging with normal direction of blood flow towards the liver. IVC: No abnormality visualized. Pancreas: Visualized portion unremarkable. Portions of pancreas obscured by gas. Spleen: Size and appearance within normal limits. Right Kidney: Length: 7.4 cm. Echogenicity is increased. Renal cortical thickness within normal limits. No mass or hydronephrosis visualized. Left Kidney: Length: 10.7 cm. Echogenicity within normal limits. No mass or hydronephrosis visualized. Areas of cortical irregularity likely represent a degree of scarring. Abdominal aorta: No aneurysm visualized. Other findings: No evident ascites. IMPRESSION: 1. Small, mildly echogenic right kidney. No obstructing focus. Reason for relative right renal atrophy uncertain. Residua of prior infection could present in this manner. This is a finding that potentially could be indicative of renal artery stenosis. In this regard, question whether patient is hypertensive. Left kidney appears normal except for areas of suspected scarring. 2. Portions of  pancreas obscured by gas. Visualized portions of pancreas appear normal. 3.  Study otherwise unremarkable. Electronically Signed   By: Lowella Grip III M.D.   On: 11/22/2019 11:53    Labs:  CBC: Recent Labs    07/02/19 0954 07/04/19 0927 11/15/19 1515 11/29/19 0608  WBC 10.3 9.2 8.0 8.4  HGB 10.9* 10.8* 11.4 11.8*  HCT 34.3* 33.6* 35.1 37.2  PLT 247 322  319 391 357    COAGS: Recent Labs    07/04/19 0927 11/29/19 0608  INR 0.9 1.0  APTT 34  --     BMP: Recent Labs    07/03/19 0534 07/04/19 0927 07/05/19 0529 11/15/19 1515  NA 138 138 140 141  K 4.1 3.9 4.2 5.2  CL 104 104 108 108*  CO2 26 21* 22 19*  GLUCOSE 113* 209* 106* 98  BUN 37* 31* 30* 36*  CALCIUM 8.6* 8.8* 8.9 9.4  CREATININE 1.94* 2.00* 1.93* 2.16*  GFRNONAA 34* 33* 34* 30*  GFRAA 39* 38* 40* 34*    LIVER FUNCTION TESTS: Recent Labs    07/03/19 0534 07/04/19 0927 07/05/19 0529 11/15/19 1515  BILITOT 0.3 0.4 0.4 <0.2  AST 63* 40 29 16  ALT 73* 70* 59* 26  ALKPHOS 101 114 111 196*  PROT 5.5* 6.1* 6.3* 7.3  ALBUMIN 2.1* 2.4* 2.4* 4.1    TUMOR MARKERS: No results for input(s): AFPTM, CEA, CA199, CHROMGRNA in the last 8760 hours.  Assessment and Plan:  CKD; HTN Proteinuria Scheduled for random renal bx per Nephrology Risks and benefits of random renal biopsy was discussed with the patient and/or patient's family including, but not limited to bleeding, infection, damage to adjacent structures or low yield requiring additional tests.  All of the questions were answered and there is agreement to proceed.  Consent signed and in chart.   Thank you for this interesting consult.  I greatly enjoyed meeting Renee Porter and look forward to participating in their care.  A copy of this report was sent to the requesting provider on this date.  Electronically Signed: Lavonia Drafts, PA-C 11/29/2019, 8:00 AM   I spent a total of  30 Minutes   in face to face in clinical  consultation, greater than 50% of which was counseling/coordinating care for random renal bx

## 2019-11-29 NOTE — Discharge Instructions (Addendum)
Biopsia de rin percutnea, cuidados posteriores Percutaneous Kidney Biopsy, Care After Esta hoja le brinda informacin sobre cmo cuidarse despus del procedimiento. Su mdico tambin podr darle instrucciones ms especficas. Comunquese con el mdico si tiene problemas o preguntas. Qu puedo esperar despus del procedimiento? Despus del procedimiento, es normal tener los siguientes sntomas:  Dolor o sensibilidad cerca del lugar de la biopsia.  Orina turbia o de color rosa durante 24horas despus del procedimiento. Siga estas instrucciones en su casa: Actividad  Retome sus actividades normales segn lo indicado por el mdico. Pregntele al mdico qu actividades son seguras para usted.  Si le administraron un sedante durante el procedimiento, puede afectarlo por varias horas. No conduzca ni opere maquinaria hasta que el mdico le indique que es seguro [place].  No levante ningn objeto que pese ms de10 libras (4.5kg) o que supere el lmite de peso que le hayan indicado, hasta que el mdico le diga que puede [place].  Evite las actividades que demandan mucho esfuerzo (que son extenuantes) hasta que el mdico se lo autorice. La [employer] de las personas deben esperar 2semanas antes de poder [employer] actividades, como hacer [place]. Instrucciones generales   Use los medicamentos de venta libre y los recetados solamente como se lo haya indicado el mdico.  Puede comer y beber despus del procedimiento. Siga las instrucciones del mdico respecto de las restricciones en las comidas o las bebidas.  Verifique TEFL teacher de la biopsia todos los das para detectar signos de infeccin. Est atento a los siguientes signos: ? Aumento del enrojecimiento, la hinchazn o Conservation officer, historic buildings. ? Lquido o sangre. ? Calor. ? Pus o mal olor.  Concurra a todas las visitas de seguimiento como se lo haya indicado el mdico. Esto es importante. Comunquese con un mdico  si:  El enrojecimiento, la hinchazn o Conservation officer, historic buildings alrededor del Environmental consultant de la biopsia [place].  Observa lquido o sangre que salen del lugar de la biopsia.  El lugar de la biopsia se siente caliente al tacto.  Tiene pus o percibe mal olor que proviene del lugar de la biopsia.  Tiene sangre en la orina durante ms de 24horas despus del procedimiento.  Tiene fiebre. Solicite ayuda de inmediato si:  Tiene orina color rojo o marrn oscuro.  No puede orinar.  Siente ardor al [place].  Se siente mareada o siente que va a desvanecerse.  Siente un dolor intenso en el abdomen o en un costado. Resumen  Despus del procedimiento, es normal sentir dolor o sensibilidad en el lugar de la biopsia y producir orina turbia o de color rosa durante las primeras 24horas.  Psychiatric nurse de la biopsia todos los das para Hydrographic surveyor signos de infeccin, como aumento del enrojecimiento, la hinchazn o el dolor; lquido, Biochemist, clinical, pus o mal olor provenientes del lugar de la biopsia; o sentir que el lugar de la biopsia est caliente al tacto.  Retome sus actividades normales segn lo indicado por el mdico. Esta informacin no tiene Marine scientist el consejo del mdico. Asegrese de hacerle al mdico cualquier pregunta que tenga. Document Revised: 07/20/2019 Document Reviewed: 07/20/2019 Elsevier Patient Education  [place] consciente moderada en los adultos (Moderate Conscious Sedation, Adult) La sedacin es el uso de un medicamento para favorecer la relajacin y Runner, broadcasting/film/video y la ansiedad. La sedacin consciente moderada es un tipo de sedacin. Cuando se somete a una sedacin consciente moderada, disminuye su nivel de alerta habitual, pero igualmente puede responder  a las instrucciones o al tacto, o a ambos. La sedacin consciente moderada se [place] durante procedimientos mdicos y dentales breves. Es ms leve que una sedacin profunda, que es un tipo de sedacin de la que no  es fcil despertarse. Tambin es ms leve que la anestesia general, que es el uso de medicamentos para dejarlo inconsciente. La sedacin consciente moderada le permite volver a sus actividades habituales ms pronto. INFORME A SU MDICO:  Cualquier alergia que tenga.  Todos los [employer], incluidos vitaminas, hierbas, gotas oftlmicas, cremas y medicamentos de venta libre.  Uso de corticoides (por va oral o cremas).  Cualquier problema que usted o sus familiares hayan tenido con sedantes y anestsicos.  Enfermedades de la sangre que tenga.  Cirugas previas.  Cualquier afeccin que padezca, como apnea del sueo.  Si est embarazada o podra estarlo.  Consumo de cigarrillos, alcohol, marihuana o drogas. RIESGOS Y COMPLICACIONES En general, se trata de un procedimiento seguro. Sin embargo, pueden presentarse problemas, por ejemplo:  Recibir demasiado medicamento (sedacin excesiva).  Nuseas.  Reaccin alrgica a un medicamento.  Problemas para respirar. Si esto ocurre, es posible que se utilice un tubo respiratorio para ayudarlo a Ambulance person. El tubo se retirar cuando despierte y respire por su cuenta.  Problemas cardacos.  Problemas pulmonares. ANTES DEL PROCEDIMIENTO Mantenerse hidratado Siga las indicaciones del mdico acerca de la hidratacin, las cuales pueden incluir lo siguiente:  Hasta 2horas antes del procedimiento, puede beber lquidos transparentes, como agua, jugos frutales transparentes, caf negro y t solo. Restricciones en las comidas y bebidas [place] comidas y las bebidas, las cuales pueden incluir lo siguiente:  Ocho horas antes del procedimiento, deje de ingerir comidas o alimentos pesados, por ejemplo, carne, alimentos fritos o alimentos grasos.  Seis horas antes del procedimiento, deje de ingerir comidas o alimentos livianos, como tostadas o cereales.  Seis horas antes del procedimiento, deje  de beber [place] o bebidas que [employer].  Dos horas antes del procedimiento, deje de beber lquidos transparentes. Medicamentos Consulte al mdico si debe hacer o no lo siguiente:  Quarry manager o suspender los medicamentos que toma habitualmente. Esto es muy importante si toma medicamentos para la diabetes o anticoagulantes.  Tomar medicamentos como aspirina e ibuprofeno. Estos medicamentos pueden tener un efecto anticoagulante en la [place]. No tome estos medicamentos antes del procedimiento si el mdico le indica que no lo haga. Pruebas y exmenes  Le harn un examen fsico.  Posiblemente deba realizarse anlisis de sangre para evaluar lo siguiente: ? Cmo estn funcionando los riones y Engineer, maintenance (IT). ? Con qu eficacia coagula la sangre. Instrucciones generales  Haga planes para que una persona lo lleve a su casa desde el hospital o la clnica.  Si se ir a su casa inmediatamente despus del procedimiento, planifique que alguien se quede con usted durante 24horas. PROCEDIMIENTO  Le colocarn una va intravenosa (IV) en una de las venas.  Se le administrar un medicamento para ayudarlo a que se relaje (sedante) a travs de la va intravenosa.  Se realizar el procedimiento mdico o dental. DESPUS DEL PROCEDIMIENTO  Le controlarn con frecuencia la presin arterial, la frecuencia cardaca, la frecuencia respiratoria y Retail buyer de oxgeno en la sangre hasta que haya desaparecido el efecto de los medicamentos administrados.  No conduzca durante 24horas. Esta informacin no tiene Marine scientist el consejo del mdico. Asegrese de hacerle al mdico cualquier pregunta que tenga. Document Revised: 05/30/2016 Document Reviewed: 01/13/2016 Elsevier  Patient Education  [employer].

## 2019-11-29 NOTE — Procedures (Signed)
Interventional Radiology Procedure Note  Procedure: US guided left medical renal biopsy.  Complications: None Recommendations:  - Ok to shower tomorrow - Do not submerge for 7 days - Routine care   Signed,  Dulcy Fanny. Earleen Newport, DO

## 2019-12-06 ENCOUNTER — Other Ambulatory Visit: Payer: Self-pay

## 2019-12-06 ENCOUNTER — Ambulatory Visit: Payer: Self-pay | Attending: Nurse Practitioner

## 2019-12-06 DIAGNOSIS — I10 Essential (primary) hypertension: Secondary | ICD-10-CM

## 2019-12-07 LAB — CMP14+EGFR
ALT: 23 IU/L (ref 0–32)
AST: 21 IU/L (ref 0–40)
Albumin/Globulin Ratio: 1.2 (ref 1.2–2.2)
Albumin: 4 g/dL (ref 3.8–4.8)
Alkaline Phosphatase: 196 IU/L — ABNORMAL HIGH (ref 39–117)
BUN/Creatinine Ratio: 17 (ref 9–23)
BUN: 38 mg/dL — ABNORMAL HIGH (ref 6–20)
Bilirubin Total: 0.3 mg/dL (ref 0.0–1.2)
CO2: 16 mmol/L — ABNORMAL LOW (ref 20–29)
Calcium: 9.6 mg/dL (ref 8.7–10.2)
Chloride: 111 mmol/L — ABNORMAL HIGH (ref 96–106)
Creatinine, Ser: 2.19 mg/dL — ABNORMAL HIGH (ref 0.57–1.00)
GFR calc Af Amer: 34 mL/min/{1.73_m2} — ABNORMAL LOW (ref >59)
GFR calc non Af Amer: 29 mL/min/{1.73_m2} — ABNORMAL LOW (ref >59)
Globulin, Total: 3.3 g/dL (ref 1.5–4.5)
Glucose: 122 mg/dL — ABNORMAL HIGH (ref 65–99)
Potassium: 5 mmol/L (ref 3.5–5.2)
Sodium: 141 mmol/L (ref 134–144)
Total Protein: 7.3 g/dL (ref 6.0–8.5)

## 2019-12-07 LAB — LIPID PANEL
Chol/HDL Ratio: 4.9 ratio — ABNORMAL HIGH (ref 0.0–4.4)
Cholesterol, Total: 192 mg/dL (ref 100–199)
HDL: 39 mg/dL — ABNORMAL LOW (ref >39)
LDL Chol Calc (NIH): 128 mg/dL — ABNORMAL HIGH (ref 0–99)
Triglycerides: 136 mg/dL (ref 0–149)
VLDL Cholesterol Cal: 25 mg/dL (ref 5–40)

## 2019-12-07 LAB — SURGICAL PATHOLOGY

## 2019-12-14 MED FILL — LABETALOL HCL 300 MG TABS: 300 | 30 days supply | Qty: 120 | Fill #1

## 2019-12-14 MED FILL — NIFEdipine ER 60 MG TB24: 60 | 30 days supply | Qty: 30 | Fill #1

## 2020-01-17 MED FILL — NIFEdipine ER 60 MG TB24: 60 | 30 days supply | Qty: 30 | Fill #2

## 2020-01-17 MED FILL — LABETALOL HCL 300 MG TABS: 300 | 15 days supply | Qty: 60 | Fill #2

## 2020-02-04 MED FILL — LABETALOL HCL 300 MG TABS: 300 | 30 days supply | Qty: 60 | Fill #0

## 2020-02-14 ENCOUNTER — Ambulatory Visit: Payer: Self-pay | Admitting: Nurse Practitioner

## 2020-02-21 MED FILL — LABETALOL HCL 300 MG TABS: 300 | 15 days supply | Qty: 60 | Fill #1

## 2020-02-21 MED FILL — NIFEdipine ER 60 MG TB24: 60 | 30 days supply | Qty: 30 | Fill #3

## 2020-03-20 ENCOUNTER — Ambulatory Visit: Payer: Self-pay | Admitting: Nurse Practitioner

## 2020-03-20 MED FILL — NIFEdipine ER 60 MG TB24: 60 | 30 days supply | Qty: 30 | Fill #0

## 2020-03-31 MED FILL — LABETALOL HCL 300 MG TABS: 300 | 15 days supply | Qty: 60 | Fill #3

## 2020-04-17 ENCOUNTER — Other Ambulatory Visit: Payer: Self-pay

## 2020-04-17 ENCOUNTER — Ambulatory Visit: Payer: No Typology Code available for payment source | Attending: Nurse Practitioner | Admitting: Nurse Practitioner

## 2020-04-17 ENCOUNTER — Other Ambulatory Visit: Payer: Self-pay | Admitting: Nurse Practitioner

## 2020-04-17 ENCOUNTER — Encounter: Payer: Self-pay | Admitting: Nurse Practitioner

## 2020-04-17 VITALS — BP 104/73 | HR 79 | Temp 97.7°F | Wt 171.0 lb

## 2020-04-17 DIAGNOSIS — H9201 Otalgia, right ear: Secondary | ICD-10-CM

## 2020-04-17 DIAGNOSIS — Z124 Encounter for screening for malignant neoplasm of cervix: Secondary | ICD-10-CM

## 2020-04-17 DIAGNOSIS — I10 Essential (primary) hypertension: Secondary | ICD-10-CM

## 2020-04-17 DIAGNOSIS — R7303 Prediabetes: Secondary | ICD-10-CM

## 2020-04-17 LAB — GLUCOSE, POCT (MANUAL RESULT ENTRY): POC Glucose: 169 mg/dl — AB (ref 70–99)

## 2020-04-17 MED FILL — NIFEdipine ER 60 MG TB24: 60 | 30 days supply | Qty: 30 | Fill #1

## 2020-04-17 MED FILL — LABETALOL HCL 300 MG TABS: 300 | 15 days supply | Qty: 60 | Fill #0

## 2020-04-17 NOTE — Progress Notes (Signed)
Assessment & Plan:  Beverly was seen today for gynecologic exam.  Diagnoses and all orders for this visit:  Encounter for Papanicolaou smear for cervical cancer screening -     Cervicovaginal ancillary only -     Cytology - PAP  Prediabetes -     Glucose (CBG)  Right ear pain -     Ambulatory referral to ENT    Patient has been counseled on age-appropriate routine health concerns for screening and prevention. These are reviewed and up-to-date. Referrals have been placed accordingly. Immunizations are up-to-date or declined.    Subjective:   Chief Complaint  Patient presents with  . Gynecologic Exam    Pt. is here for a pap smear.   HPI Renee Porter 34 y.o. female presents to office today for PAP. Endorses fatigue, right ear pain, bilateral shoulder pain. Believes her symptoms of fatigue and shoulder Recommended heat and massage for shoulder pain. I did examine her right ear and there is an apparent foreign object (possible qtip ) lodged in the inner ear canal. I was unable remove the object here in the office today. She was referred to ENT today.   Review of Systems  Constitutional: Positive for malaise/fatigue. Negative for chills, fever and weight loss.  HENT: Positive for ear pain.   Respiratory: Negative.  Negative for cough, shortness of breath and wheezing.   Cardiovascular: Negative.  Negative for chest pain, orthopnea and leg swelling.  Gastrointestinal: Negative for abdominal pain.  Genitourinary: Negative.  Negative for flank pain.  Musculoskeletal: Positive for myalgias.  Skin: Negative.  Negative for rash.  Psychiatric/Behavioral: Negative for suicidal ideas.    Past Medical History:  Diagnosis Date  . Hypertension   . Prediabetes     Past Surgical History:  Procedure Laterality Date  . CESAREAN SECTION    . FOOT SURGERY      Family History  Problem Relation Age of Onset  . Diabetes Father     Social History Reviewed with no changes to be  made today.   Outpatient Medications Prior to Visit  Medication Sig Dispense Refill  . acetaminophen (TYLENOL) 325 MG tablet Take 325 mg by mouth every 6 (six) hours as needed for moderate pain or headache.    Marland Kitchen NIFEdipine (PROCARDIA XL/NIFEDICAL XL) 60 MG 24 hr tablet Take 1 tablet (60 mg total) by mouth daily. (Patient taking differently: Take 60 mg by mouth every evening. ) 30 tablet 3  . Prenatal Vit-Fe Fumarate-FA (PRENATAL VITAMINS PO) Take 1 tablet by mouth daily in the afternoon.     . labetalol (NORMODYNE) 300 MG tablet Take 2 tablets (600 mg total) by mouth 2 (two) times daily. 60 tablet 3   No facility-administered medications prior to visit.    No Known Allergies     Objective:    BP 104/73 (BP Location: Left Arm, Patient Position: Sitting, Cuff Size: Normal)   Pulse 79   Temp 97.7 F (36.5 C) (Temporal)   Wt 171 lb (77.6 kg)   SpO2 99%   BMI 37.00 kg/m  Wt Readings from Last 3 Encounters:  04/17/20 171 lb (77.6 kg)  11/29/19 170 lb (77.1 kg)  11/15/19 172 lb (78 kg)    Physical Exam Constitutional:      Appearance: She is well-developed.  HENT:     Head: Normocephalic.  Cardiovascular:     Rate and Rhythm: Normal rate and regular rhythm.     Heart sounds: Normal heart sounds.  Pulmonary:  Effort: Pulmonary effort is normal.     Breath sounds: Normal breath sounds.  Abdominal:     General: Bowel sounds are normal.     Palpations: Abdomen is soft.     Hernia: There is no hernia in the left inguinal area.  Genitourinary:    Labia:        Right: No rash, tenderness, lesion or injury.        Left: No rash, tenderness, lesion or injury.      Vagina: Normal. No signs of injury and foreign body. No vaginal discharge, erythema, tenderness or bleeding.     Cervix: No cervical motion tenderness or friability.     Uterus: Not deviated and not enlarged.      Adnexa:        Right: No mass, tenderness or fullness.         Left: No mass, tenderness or fullness.        Rectum: Normal. No external hemorrhoid.  Lymphadenopathy:     Lower Body: No right inguinal adenopathy. No left inguinal adenopathy.  Skin:    General: Skin is warm and dry.  Neurological:     Mental Status: She is alert and oriented to person, place, and time.  Psychiatric:        Behavior: Behavior normal.        Thought Content: Thought content normal.        Judgment: Judgment normal.          Patient has been counseled extensively about nutrition and exercise as well as the importance of adherence with medications and regular follow-up. The patient was given clear instructions to go to ER or return to medical center if symptoms don't improve, worsen or new problems develop. The patient verbalized understanding.   Follow-up: Return in about 3 months (around 07/18/2020) for HTN.   [name], FNP-BC [facility] [place], [place]   04/17/2020, 1:03 PM

## 2020-04-17 NOTE — Patient Instructions (Signed)
Renee Porter Doctor  Address: [address] [place], [place], [place] [address] Phone: [phone]

## 2020-04-18 LAB — CERVICOVAGINAL ANCILLARY ONLY
Bacterial Vaginitis (gardnerella): NEGATIVE
Candida Glabrata: NEGATIVE
Candida Vaginitis: NEGATIVE
Chlamydia: NEGATIVE
Comment: NEGATIVE
Comment: NEGATIVE
Comment: NEGATIVE
Comment: NEGATIVE
Comment: NEGATIVE
Comment: NORMAL
Neisseria Gonorrhea: NEGATIVE
Trichomonas: NEGATIVE

## 2020-04-19 LAB — CYTOLOGY - PAP
Comment: NEGATIVE
Diagnosis: NEGATIVE
High risk HPV: NEGATIVE

## 2020-05-03 ENCOUNTER — Telehealth: Payer: Self-pay | Admitting: Nurse Practitioner

## 2020-05-03 NOTE — Telephone Encounter (Signed)
To Specialty To Provider To Location/Place of Service To Department  Otolaryngology none none CNM-CHRIS Rubye [place] calling regarding this referral, pt has no ins they do not take [employer], nor medicaid or [place]. Has reached out to pt to offer self pay, otherwise with no response to that will close referral. Pt may need additional referral contact # [phone]

## 2020-05-04 NOTE — Telephone Encounter (Signed)
Good Afternoon I spoke to patient and she is aware that we don't have ent specialist with [facility] or [facility] and I told her that [place] ent will see her $100 consultation  she is going to let me know  when she is ready to proceed with the referral .

## 2020-05-08 MED FILL — LABETALOL HCL 300 MG TABS: 300 | 15 days supply | Qty: 60 | Fill #1

## 2020-05-10 ENCOUNTER — Other Ambulatory Visit: Payer: Self-pay | Admitting: Nurse Practitioner

## 2020-05-10 DIAGNOSIS — I10 Essential (primary) hypertension: Secondary | ICD-10-CM

## 2020-05-10 MED ORDER — NIFEDIPINE ER OSMOTIC RELEASE 60 MG PO TB24: 60.0000 mg | ORAL_TABLET | Freq: Every day | ORAL | 3 refills | Status: DC

## 2020-05-10 NOTE — Telephone Encounter (Signed)
Copied from [place] [id number]. Topic: Quick Communication - Rx Refill/Question >> May 10, 2020  1:08 PM Leward Quan A wrote: Medication: NIFEdipine (PROCARDIA XL/NIFEDICAL XL) 60 MG 24 hr tablet  Per patient she is out of medication   Has the patient contacted their pharmacy? Yes.   (Agent: If no, request that the patient contact the pharmacy for the refill.) (Agent: If yes, when and what did the pharmacy advise?)  Preferred Pharmacy (with phone number or street name): [facility], [place] [address]  Phone:  [phone] Fax:  [phone]     Agent: Please be advised that RX refills may take up to 3 business days. We ask that you follow-up with your pharmacy.

## 2020-05-12 MED FILL — NIFEdipine ER 60 MG TB24: 60 | 30 days supply | Qty: 30 | Fill #2

## 2020-05-15 MED FILL — LABETALOL HCL 300 MG TABS: 300 | 15 days supply | Qty: 60 | Fill #1

## 2020-05-31 MED FILL — LABETALOL HCL 300 MG TABS: 300 | 15 days supply | Qty: 60 | Fill #2

## 2020-06-16 MED FILL — LABETALOL HCL 300 MG TABS: 300 | 15 days supply | Qty: 60 | Fill #3

## 2020-06-16 MED FILL — NIFEdipine ER 60 MG TB24: 60 | 30 days supply | Qty: 30 | Fill #3

## 2020-06-28 ENCOUNTER — Other Ambulatory Visit: Payer: Self-pay | Admitting: Nephrology

## 2020-06-28 MED FILL — LOSARTAN POTASSIUM 25 MG TA: 25 | 30 days supply | Qty: 30 | Fill #0

## 2020-07-03 MED FILL — LABETALOL HCL 300 MG TABLET: 300 | 30 days supply | Qty: 60 | Fill #0

## 2020-07-18 ENCOUNTER — Ambulatory Visit: Payer: No Typology Code available for payment source | Admitting: Nurse Practitioner

## 2020-07-19 ENCOUNTER — Other Ambulatory Visit: Payer: Self-pay | Admitting: Obstetrics and Gynecology

## 2020-07-19 DIAGNOSIS — I10 Essential (primary) hypertension: Secondary | ICD-10-CM

## 2020-07-19 MED FILL — LOSARTAN POTASSIUM 25 MG TA: 25 | 30 days supply | Qty: 30 | Fill #1

## 2020-07-21 ENCOUNTER — Other Ambulatory Visit: Payer: Self-pay

## 2020-07-21 DIAGNOSIS — I10 Essential (primary) hypertension: Secondary | ICD-10-CM

## 2020-07-21 MED ORDER — LABETALOL HCL 300 MG PO TABS: 600.0000 mg | ORAL_TABLET | Freq: Two times a day (BID) | ORAL | 0 refills | Status: DC

## 2020-07-21 NOTE — Telephone Encounter (Signed)
Pt. Came in requesting one month supply for her Labetalol 300mg  2 tablet 2x a day.  Pt. Wanted a printed Rx to take to [facility] due to [facility] pharmacy close at 5:15PM.

## 2020-08-13 ENCOUNTER — Other Ambulatory Visit: Payer: Self-pay | Admitting: Nurse Practitioner

## 2020-08-13 DIAGNOSIS — I10 Essential (primary) hypertension: Secondary | ICD-10-CM

## 2020-08-13 MED ORDER — LABETALOL HCL 300 MG PO TABS: 300.0000 mg | ORAL_TABLET | Freq: Two times a day (BID) | ORAL | 3 refills | Status: DC

## 2020-08-13 MED ORDER — LOSARTAN POTASSIUM 25 MG PO TABS: 25.0000 mg | ORAL_TABLET | Freq: Every day | ORAL | 1 refills | Status: DC

## 2020-08-17 NOTE — Progress Notes (Signed)
Called pt stated taking Labetalol 300 BID, Losartan 25 mg daily only. PT stated her kidney specialist took her off Nifedipine. Asked if she should start retaking it ? Please advice

## 2020-08-20 NOTE — Progress Notes (Signed)
Please verify with nephrologist office what medications the patient should be on currently. Thank you.

## 2020-08-24 ENCOUNTER — Other Ambulatory Visit: Payer: Self-pay | Admitting: Nurse Practitioner

## 2020-08-24 DIAGNOSIS — I10 Essential (primary) hypertension: Secondary | ICD-10-CM

## 2020-08-24 MED ORDER — LABETALOL HCL 300 MG PO TABS: 300.0000 mg | ORAL_TABLET | Freq: Two times a day (BID) | ORAL | 3 refills | Status: DC

## 2020-08-24 MED FILL — LABETALOL HCL 300 MG TABLET: 300 | 30 days supply | Qty: 60 | Fill #0

## 2020-08-24 MED FILL — LOSARTAN POTASSIUM 25 MG TA: 25 | 30 days supply | Qty: 30 | Fill #0

## 2020-08-24 NOTE — Telephone Encounter (Signed)
Patient is requesting RF on medication refilled 1 week ago- medication was sent as print- sent for review of Rx- unable to change original Rx

## 2020-08-25 ENCOUNTER — Other Ambulatory Visit: Payer: Self-pay | Admitting: Nurse Practitioner

## 2020-08-28 ENCOUNTER — Other Ambulatory Visit: Payer: Self-pay

## 2020-08-28 ENCOUNTER — Ambulatory Visit: Payer: Self-pay | Attending: Nurse Practitioner | Admitting: Nurse Practitioner

## 2020-08-28 ENCOUNTER — Encounter: Payer: Self-pay | Admitting: Nurse Practitioner

## 2020-08-28 ENCOUNTER — Other Ambulatory Visit: Payer: Self-pay | Admitting: Nurse Practitioner

## 2020-08-28 VITALS — BP 136/87 | HR 81 | Temp 97.1°F | Wt 170.0 lb

## 2020-08-28 DIAGNOSIS — Z23 Encounter for immunization: Secondary | ICD-10-CM

## 2020-08-28 DIAGNOSIS — E785 Hyperlipidemia, unspecified: Secondary | ICD-10-CM

## 2020-08-28 DIAGNOSIS — N184 Chronic kidney disease, stage 4 (severe): Secondary | ICD-10-CM

## 2020-08-28 DIAGNOSIS — I10 Essential (primary) hypertension: Secondary | ICD-10-CM

## 2020-08-28 DIAGNOSIS — R7303 Prediabetes: Secondary | ICD-10-CM

## 2020-08-28 LAB — GLUCOSE, POCT (MANUAL RESULT ENTRY): POC Glucose: 135 mg/dl — AB (ref 70–99)

## 2020-08-28 LAB — POCT GLYCOSYLATED HEMOGLOBIN (HGB A1C): Hemoglobin A1C: 5.9 % — AB (ref 4.0–5.6)

## 2020-08-28 MED ORDER — LABETALOL HCL 300 MG PO TABS: 600.0000 mg | ORAL_TABLET | Freq: Two times a day (BID) | ORAL | 1 refills | Status: DC

## 2020-08-28 MED ORDER — NIFEDIPINE ER OSMOTIC RELEASE 60 MG PO TB24: 60.0000 mg | ORAL_TABLET | Freq: Every day | ORAL | 1 refills | Status: DC

## 2020-08-28 MED ORDER — NIFEDIPINE ER OSMOTIC RELEASE 60 MG PO TB24: 60.0000 mg | ORAL_TABLET | Freq: Every day | ORAL | 3 refills | Status: DC

## 2020-08-28 NOTE — Progress Notes (Signed)
Assessment & Plan:  Renee Porter was seen today for follow-up.  Diagnoses and all orders for this visit:  Essential hypertension -     Discontinue: NIFEdipine (PROCARDIA XL/NIFEDICAL XL) 60 MG 24 hr tablet; Take 1 tablet (60 mg total) by mouth daily. -     CMP14+EGFR -     labetalol (NORMODYNE) 300 MG tablet; Take 2 tablets (600 mg total) by mouth 2 (two) times daily. DOSAGE CHANGE. WIll need early refill. Please fill as a 90 day supply -     NIFEdipine (PROCARDIA XL/NIFEDICAL XL) 60 MG 24 hr tablet; Take 1 tablet (60 mg total) by mouth daily. Please fill as a 90 day supply Continue blood sugar control as discussed in office today, low carbohydrate diet, and regular physical exercise as tolerated, 150 minutes per week (30 min each day, 5 days per week, or 50 min 3 days per week). Keep blood sugar logs with fasting goal of 90-130 mg/dl, post prandial (after you eat) less than 180.  For Hypoglycemia: BS <60 and Hyperglycemia BS >400; contact the clinic ASAP. Annual eye exams and foot exams are recommended.   Prediabetes -     Glucose (CBG) -     HgB A1c  Stage 4 chronic kidney disease (HCC) -     CMP14+EGFR -     CBC  Dyslipidemia, goal LDL below 70 -     Lipid panel INSTRUCTIONS: Work on a low fat, heart healthy diet and participate in regular aerobic exercise program by working out at least 150 minutes per week; 5 days a week-30 minutes per day. Avoid red meat/beef/steak,  fried foods. junk foods, sodas, sugary drinks, unhealthy snacking, alcohol and smoking.  Drink at least 80 oz of water per day and monitor your carbohydrate intake daily.  Lab Results  Component Value Date   LDLCALC 128 (H) 12/06/2019   Need for immunization against influenza -     Flu Vaccine QUAD 36+ mos IM    Patient has been counseled on age-appropriate routine health concerns for screening and prevention. These are reviewed and up-to-date. Referrals have been placed accordingly. Immunizations are up-to-date or  declined.    Subjective:   Chief Complaint  Patient presents with   Follow-up    Pt. is here for 3 months F.U. on hypertension.    HPI Renee Porter 34 y.o. female presents to office today for follow up. VRI was used to communicate directly with patient for the entire encounter including providing detailed patient instructions.   Essential Hypertension She was taken off losartan at her last nephrology appt. She is only taking procardia XL 60 mg daily and labetalol 600 mg BID. She will follow up with nephrology in January. Denies chest pain, shortness of breath, palpitations, lightheadedness, dizziness, headaches or BLE edema.  BP Readings from Last 3 Encounters:  08/28/20 136/87  04/17/20 104/73  11/29/19 100/65   Lab Results  Component Value Date   HGBA1C 5.9 (A) 08/28/2020   Lab Results  Component Value Date   HGBA1C 5.7 (H) 11/15/2019   Review of Systems  Constitutional: Negative for fever, malaise/fatigue and weight loss.  HENT: Negative.  Negative for nosebleeds.   Eyes: Negative.  Negative for blurred vision, double vision and photophobia.  Respiratory: Negative.  Negative for cough and shortness of breath.   Cardiovascular: Negative.  Negative for chest pain, palpitations and leg swelling.  Gastrointestinal: Negative.  Negative for heartburn, nausea and vomiting.  Musculoskeletal: Negative.  Negative for myalgias.  Neurological: Negative.  Negative for dizziness, focal weakness, seizures and headaches.  Psychiatric/Behavioral: Negative.  Negative for suicidal ideas.    Past Medical History:  Diagnosis Date   Hypertension    Prediabetes     Past Surgical History:  Procedure Laterality Date   CESAREAN SECTION     FOOT SURGERY      Family History  Problem Relation Age of Onset   Diabetes Father     Social History Reviewed with no changes to be made today.   Outpatient Medications Prior to Visit  Medication Sig Dispense Refill   acetaminophen  (TYLENOL) 325 MG tablet Take 325 mg by mouth every 6 (six) hours as needed for moderate pain or headache.     labetalol (NORMODYNE) 300 MG tablet Take 1 tablet (300 mg total) by mouth 2 (two) times daily. 60 tablet 3   NIFEdipine (PROCARDIA XL/NIFEDICAL XL) 60 MG 24 hr tablet Take 1 tablet (60 mg total) by mouth daily. 30 tablet 3   Prenatal Vit-Fe Fumarate-FA (PRENATAL VITAMINS PO) Take 1 tablet by mouth daily in the afternoon.  (Patient not taking: Reported on 08/28/2020)     No facility-administered medications prior to visit.    No Known Allergies     Objective:    BP 136/87 (BP Location: Left Arm, Patient Position: Sitting, Cuff Size: Normal)    Pulse 81    Temp (!) 97.1 F (36.2 C) (Temporal)    Wt 170 lb (77.1 kg)    SpO2 100%    BMI 36.79 kg/m  Wt Readings from Last 3 Encounters:  08/28/20 170 lb (77.1 kg)  04/17/20 171 lb (77.6 kg)  11/29/19 170 lb (77.1 kg)    Physical Exam Vitals and nursing note reviewed.  Constitutional:      Appearance: She is well-developed.  HENT:     Head: Normocephalic and atraumatic.  Cardiovascular:     Rate and Rhythm: Normal rate and regular rhythm.     Heart sounds: Normal heart sounds. No murmur heard.  No friction rub. No gallop.   Pulmonary:     Effort: Pulmonary effort is normal. No tachypnea or respiratory distress.     Breath sounds: Normal breath sounds. No decreased breath sounds, wheezing, rhonchi or rales.  Chest:     Chest wall: No tenderness.  Abdominal:     General: Bowel sounds are normal.     Palpations: Abdomen is soft.  Musculoskeletal:        General: Normal range of motion.     Cervical back: Normal range of motion.  Skin:    General: Skin is warm and dry.  Neurological:     Mental Status: She is alert and oriented to person, place, and time.     Coordination: Coordination normal.  Psychiatric:        Behavior: Behavior normal. Behavior is cooperative.        Thought Content: Thought content normal.         Judgment: Judgment normal.          Patient has been counseled extensively about nutrition and exercise as well as the importance of adherence with medications and regular follow-up. The patient was given clear instructions to go to ER or return to medical center if symptoms don't improve, worsen or new problems develop. The patient verbalized understanding.   Follow-up: Return in about 3 months (around 11/28/2020).   [name], FNP-BC [facility] [place], [place]   08/28/2020, 7:59 PM

## 2020-08-29 LAB — CMP14+EGFR
ALT: 20 IU/L (ref 0–32)
AST: 17 IU/L (ref 0–40)
Albumin/Globulin Ratio: 1.4 (ref 1.2–2.2)
Albumin: 4.3 g/dL (ref 3.8–4.8)
Alkaline Phosphatase: 158 IU/L — ABNORMAL HIGH (ref 44–121)
BUN/Creatinine Ratio: 13 (ref 9–23)
BUN: 28 mg/dL — ABNORMAL HIGH (ref 6–20)
Bilirubin Total: 0.2 mg/dL (ref 0.0–1.2)
CO2: 18 mmol/L — ABNORMAL LOW (ref 20–29)
Calcium: 9.3 mg/dL (ref 8.7–10.2)
Chloride: 110 mmol/L — ABNORMAL HIGH (ref 96–106)
Creatinine, Ser: 2.08 mg/dL — ABNORMAL HIGH (ref 0.57–1.00)
GFR calc Af Amer: 36 mL/min/{1.73_m2} — ABNORMAL LOW (ref >59)
GFR calc non Af Amer: 31 mL/min/{1.73_m2} — ABNORMAL LOW (ref >59)
Globulin, Total: 3 g/dL (ref 1.5–4.5)
Glucose: 109 mg/dL — ABNORMAL HIGH (ref 65–99)
Potassium: 4.9 mmol/L (ref 3.5–5.2)
Sodium: 140 mmol/L (ref 134–144)
Total Protein: 7.3 g/dL (ref 6.0–8.5)

## 2020-08-29 LAB — CBC
Hematocrit: 36.9 % (ref 34.0–46.6)
Hemoglobin: 11.9 g/dL (ref 11.1–15.9)
MCH: 26.6 pg (ref 26.6–33.0)
MCHC: 32.2 g/dL (ref 31.5–35.7)
MCV: 83 fL (ref 79–97)
Platelets: 345 10*3/uL (ref 150–450)
RBC: 4.47 x10E6/uL (ref 3.77–5.28)
RDW: 14.4 % (ref 11.7–15.4)
WBC: 8.6 10*3/uL (ref 3.4–10.8)

## 2020-08-29 LAB — LIPID PANEL
Chol/HDL Ratio: 5.2 ratio — ABNORMAL HIGH (ref 0.0–4.4)
Cholesterol, Total: 219 mg/dL — ABNORMAL HIGH (ref 100–199)
HDL: 42 mg/dL (ref >39)
LDL Chol Calc (NIH): 150 mg/dL — ABNORMAL HIGH (ref 0–99)
Triglycerides: 147 mg/dL (ref 0–149)
VLDL Cholesterol Cal: 27 mg/dL (ref 5–40)

## 2020-08-29 NOTE — Progress Notes (Signed)
NOTED

## 2020-09-07 MED FILL — LABETALOL HCL 300 MG TABLET: 300 | 90 days supply | Qty: 360 | Fill #0

## 2020-09-11 MED FILL — NIFEdipine ER 60 MG TB24: 60 | 90 days supply | Qty: 90 | Fill #0

## 2020-11-06 ENCOUNTER — Other Ambulatory Visit: Payer: Self-pay | Admitting: Nephrology

## 2020-11-06 MED FILL — LOSARTAN POTASSIUM 25 MG TA: 25 | 30 days supply | Qty: 30 | Fill #0

## 2020-11-27 ENCOUNTER — Ambulatory Visit: Payer: No Typology Code available for payment source | Admitting: Nurse Practitioner

## 2021-01-08 ENCOUNTER — Telehealth: Payer: Self-pay | Admitting: Nurse Practitioner

## 2021-01-08 ENCOUNTER — Other Ambulatory Visit: Payer: Self-pay

## 2021-01-08 ENCOUNTER — Other Ambulatory Visit: Payer: Self-pay | Admitting: Nurse Practitioner

## 2021-01-08 ENCOUNTER — Ambulatory Visit: Payer: No Typology Code available for payment source | Admitting: Nurse Practitioner

## 2021-01-08 DIAGNOSIS — I10 Essential (primary) hypertension: Secondary | ICD-10-CM

## 2021-01-08 MED ORDER — LABETALOL HCL 300 MG PO TABS
ORAL_TABLET | ORAL | 1 refills | Status: DC
  Filled 2021-01-08 – 2021-01-18 (×2): qty 120, 30d supply, fill #0
  Filled 2021-03-20 – 2021-03-26 (×2): qty 120, 30d supply, fill #1
  Filled 2021-04-20: qty 120, 30d supply, fill #2
  Filled 2021-04-30: qty 360, 90d supply, fill #2

## 2021-01-08 MED ORDER — NIFEDIPINE ER OSMOTIC RELEASE 60 MG PO TB24
60.0000 mg | ORAL_TABLET | Freq: Every day | ORAL | 1 refills | Status: DC
  Filled 2021-01-08: qty 30, 30d supply, fill #0

## 2021-01-08 NOTE — Telephone Encounter (Signed)
I return Pt call, she was explain that the bill she received is an estimate of how much will be the visit, now if she has insurance we need a copy of the insurance or if she apply for thew financial program that will take care the bill

## 2021-01-08 NOTE — Telephone Encounter (Signed)
Requested Prescriptions  Pending Prescriptions Disp Refills  . labetalol (NORMODYNE) 300 MG tablet 360 tablet 1    Sig: TAKE 2 TABLETS (600 MG TOTAL) BY MOUTH 2 (TWO) TIMES DAILY.     Cardiovascular:  Beta Blockers Passed - 01/08/2021  8:57 AM      Passed - Last BP in normal range    BP Readings from Last 1 Encounters:  08/28/20 136/87         Passed - Last Heart Rate in normal range    Pulse Readings from Last 1 Encounters:  08/28/20 81         Passed - Valid encounter within last 6 months    Recent Outpatient Visits          4 months ago Essential hypertension   [place], Vernia Buff, NP   8 months ago Encounter for Papanicolaou smear for cervical cancer screening   [place], Vernia Buff, NP   1 year ago Essential hypertension   [place], Vernia Buff, NP   1 year ago Essential hypertension   [place], NP   1 year ago Hypertension, unspecified type   [place], Dionne Bucy, [place]      Future Appointments            In 2 months [name], NP [place]           . NIFEdipine (PROCARDIA XL/NIFEDICAL XL) 60 MG 24 hr tablet 90 tablet 1    Sig: Take 1 tablet (60 mg total) by mouth daily. Please fill as a 90 day supply     Cardiovascular:  Calcium Channel Blockers Passed - 01/08/2021  8:57 AM      Passed - Last BP in normal range    BP Readings from Last 1 Encounters:  08/28/20 136/87         Passed - Valid encounter within last 6 months    Recent Outpatient Visits          4 months ago Essential hypertension   [place], Vernia Buff, NP   8 months ago Encounter for Papanicolaou smear for cervical cancer screening   [place], Vernia Buff, NP   1 year ago Essential  hypertension   [place], Vernia Buff, NP   1 year ago Essential hypertension   [place], NP   1 year ago Hypertension, unspecified type   [place], Dionne Bucy, [place]      Future Appointments            In 2 months [name], NP [place]

## 2021-01-08 NOTE — Telephone Encounter (Signed)
Medication: Rx #: [id number] labetalol (NORMODYNE) 300 MG tablet [id number] , Rx #: [id number] NIFEdipine (ADALAT CC) 60 MG 24 hr tablet [id number]   Has the patient contacted their pharmacy? YES (Agent: If no, request that the patient contact the pharmacy for the refill.) (Agent: If yes, when and what did the pharmacy advise?)  Preferred Pharmacy (with phone number or street name): [facility] [address] [place] [address] Phone: [phone] Fax: [phone] Hours: M-F 8:30a-5:30p    Agent: Please be advised that RX refills may take up to 3 business days. We ask that you follow-up with your pharmacy.

## 2021-01-08 NOTE — Telephone Encounter (Signed)
Pt is calling with [employer] on the line pt states that she received a letter stating that she was going have a pay $170 before she could be seen. Is this correct? Please advise Cb- [phone]

## 2021-01-10 ENCOUNTER — Other Ambulatory Visit: Payer: Self-pay

## 2021-01-17 ENCOUNTER — Other Ambulatory Visit: Payer: Self-pay

## 2021-01-18 ENCOUNTER — Other Ambulatory Visit: Payer: Self-pay

## 2021-01-18 MED FILL — Nifedipine Tab ER 24HR 60 MG: ORAL | 30 days supply | Qty: 30 | Fill #0 | Status: AC

## 2021-01-22 ENCOUNTER — Other Ambulatory Visit: Payer: Self-pay

## 2021-02-16 MED FILL — Nifedipine Tab ER 24HR 60 MG: ORAL | 30 days supply | Qty: 30 | Fill #1 | Status: AC

## 2021-02-19 ENCOUNTER — Other Ambulatory Visit: Payer: Self-pay

## 2021-03-07 ENCOUNTER — Other Ambulatory Visit: Payer: Self-pay

## 2021-03-07 MED ORDER — LOSARTAN POTASSIUM 25 MG PO TABS
25.0000 mg | ORAL_TABLET | Freq: Every day | ORAL | 6 refills | Status: DC
  Filled 2021-03-07 – 2021-03-26 (×2): qty 30, 30d supply, fill #0
  Filled 2021-04-30: qty 90, 90d supply, fill #1

## 2021-03-20 ENCOUNTER — Other Ambulatory Visit: Payer: Self-pay

## 2021-03-20 ENCOUNTER — Ambulatory Visit: Payer: No Typology Code available for payment source | Admitting: Nurse Practitioner

## 2021-03-20 ENCOUNTER — Telehealth: Payer: Self-pay | Admitting: Nurse Practitioner

## 2021-03-20 NOTE — Telephone Encounter (Signed)
Copied from [place] [id number]. Topic: Quick Communication - Rx Refill/Question >> Mar 20, 2021  2:54 PM Tessa Lerner A wrote: Medication: Rx #: [id number]  losartan (COZAAR) 25 MG tablet   Rx #: [id number]  labetalol (NORMODYNE) 300 MG tablet    Has the patient contacted their pharmacy? Yes.   (Agent: If no, request that the patient contact the pharmacy for the refill.) (Agent: If yes, when and what did the pharmacy advise?)  Preferred Pharmacy (with phone number or street name): [facility] and [address]  Phone:  [phone] Fax:  [phone]   Agent: Please be advised that RX refills may take up to 3 business days. We ask that you follow-up with your pharmacy.

## 2021-03-20 NOTE — Telephone Encounter (Signed)
[facility] already have script on file and will get ready for the patient.

## 2021-03-26 ENCOUNTER — Other Ambulatory Visit: Payer: Self-pay

## 2021-04-20 ENCOUNTER — Other Ambulatory Visit: Payer: Self-pay

## 2021-04-27 ENCOUNTER — Other Ambulatory Visit: Payer: Self-pay

## 2021-04-30 ENCOUNTER — Other Ambulatory Visit: Payer: Self-pay

## 2021-05-14 ENCOUNTER — Ambulatory Visit: Payer: Self-pay | Attending: Nurse Practitioner | Admitting: Nurse Practitioner

## 2021-05-14 ENCOUNTER — Other Ambulatory Visit: Payer: Self-pay

## 2021-05-14 ENCOUNTER — Encounter: Payer: Self-pay | Admitting: Nurse Practitioner

## 2021-05-14 VITALS — BP 178/112 | HR 84 | Ht <= 58 in | Wt 188.2 lb

## 2021-05-14 DIAGNOSIS — I10 Essential (primary) hypertension: Secondary | ICD-10-CM

## 2021-05-14 DIAGNOSIS — E785 Hyperlipidemia, unspecified: Secondary | ICD-10-CM

## 2021-05-14 DIAGNOSIS — R7303 Prediabetes: Secondary | ICD-10-CM

## 2021-05-14 DIAGNOSIS — R809 Proteinuria, unspecified: Secondary | ICD-10-CM

## 2021-05-14 MED ORDER — LOSARTAN POTASSIUM 25 MG PO TABS
25.0000 mg | ORAL_TABLET | Freq: Every day | ORAL | 6 refills | Status: DC
  Filled 2021-05-14: qty 30, 30d supply, fill #0

## 2021-05-14 MED ORDER — LOSARTAN POTASSIUM 50 MG PO TABS
50.0000 mg | ORAL_TABLET | Freq: Every day | ORAL | 1 refills | Status: DC
  Filled 2021-05-14: qty 30, 30d supply, fill #0
  Filled 2021-06-15 – 2021-06-25 (×2): qty 30, 30d supply, fill #1

## 2021-05-14 MED ORDER — LABETALOL HCL 300 MG PO TABS
ORAL_TABLET | ORAL | 1 refills | Status: DC
  Filled 2021-05-14: qty 360, fill #0
  Filled 2021-08-03: qty 360, 90d supply, fill #0

## 2021-05-14 NOTE — Patient Instructions (Signed)
La presin arterial debe ser inferior a 130/80 y superior a 110/60.

## 2021-05-14 NOTE — Progress Notes (Signed)
Assessment & Plan:  [name] was seen today for hypertension.  Diagnoses and all orders for this visit:  Primary hypertension -     CMP14+EGFR -     labetalol (NORMODYNE) 300 MG tablet; TAKE 2 TABLETS (600 MG TOTAL) BY MOUTH 2 (TWO) TIMES DAILY. -     Discontinue: losartan (COZAAR) 25 MG tablet; Take 1 tablet by mouth daily -     losartan (COZAAR) 50 MG tablet; Take 1 tablet (50 mg total) by mouth daily.  Prediabetes -     Hemoglobin A1c  Dyslipidemia, goal LDL below 70 -     Lipid panel   Patient has been counseled on age-appropriate routine health concerns for screening and prevention. These are reviewed and up-to-date. Referrals have been placed accordingly. Immunizations are up-to-date or declined.    Subjective:   Chief Complaint  Patient presents with   Hypertension   HPI Renee Porter 35 y.o. female presents to office today for follow up to HTN  has a past medical history of Hypertension and Prediabetes.    HTN Poorly controlled. She had fried shrimp and caffeine coffee this morning. Just took her blood pressure medication one hour prior to her office visit. She is currently taking losartan 25 mg daily and labetalol 600 mg BID. She was taken off nifedipine 60 mg daily by nephrologist due to excessive protein in the urine. Will increase losartan today to 50 mg. Need to closely monitor for proteinuria and increased creatinine. Denies chest pain, shortness of breath, palpitations, lightheadedness, dizziness, headaches or BLE edema.   BP Readings from Last 3 Encounters:  05/14/21 (!) 178/112  08/28/20 136/87  04/17/20 104/73      Prediabetes Currently well controlled. LDL not goal. She has not been taking atorvastatin 20 mg daily as prescribed.  Lab Results  Component Date   HGBA1C            5.9 08/28/20   Lab Results  Component Value Date   LDLCALC 158 (H) 05/14/2021    Review of Systems  Constitutional:  Negative for fever, malaise/fatigue and weight loss.   HENT: Negative.  Negative for nosebleeds.   Eyes: Negative.  Negative for blurred vision, double vision and photophobia.  Respiratory: Negative.  Negative for cough and shortness of breath.   Cardiovascular: Negative.  Negative for chest pain, palpitations and leg swelling.  Gastrointestinal: Negative.  Negative for heartburn, nausea and vomiting.  Musculoskeletal: Negative.  Negative for myalgias.  Neurological: Negative.  Negative for dizziness, focal weakness, seizures and headaches.  Psychiatric/Behavioral: Negative.  Negative for suicidal ideas.    Past Medical History:  Diagnosis Date   Hypertension    Prediabetes     Past Surgical History:  Procedure Laterality Date   CESAREAN SECTION     FOOT SURGERY      Family History  Problem Relation Age of Onset   Diabetes Father     Social History Reviewed with no changes to be made today.   Outpatient Medications Prior to Visit  Medication Sig Dispense Refill   acetaminophen (TYLENOL) 325 MG tablet Take 325 mg by mouth every 6 (six) hours as needed for moderate pain or headache.     labetalol (NORMODYNE) 300 MG tablet TAKE 2 TABLETS (600 MG TOTAL) BY MOUTH 2 (TWO) TIMES DAILY. 360 tablet 1   losartan (COZAAR) 25 MG tablet TAKE 1 TABLET BY MOUTH DAILY 30 tablet 6   losartan (COZAAR) 25 MG tablet TAKE 1 TABLET BY MOUTH DAILY 30 tablet  6   losartan (COZAAR) 25 MG tablet Take 1 tablet by mouth daily 30 tablet 6   NIFEdipine (ADALAT CC) 60 MG 24 hr tablet TAKE 1 TABLET (60 MG TOTAL) BY MOUTH DAILY. 90 tablet 1   Prenatal Vit-Fe Fumarate-FA (PRENATAL VITAMINS PO) Take 1 tablet by mouth daily in the afternoon.  (Patient not taking: Reported on 05/14/2021)     NIFEdipine (PROCARDIA XL/NIFEDICAL XL) 60 MG 24 hr tablet Take 1 tablet (60 mg total) by mouth daily. Please fill as a 90 day supply 90 tablet 1   No facility-administered medications prior to visit.    No Known Allergies     Objective:    BP (!) 178/112   Pulse 84   Ht  4' 9"  (1.448 m)   Wt 188 lb 3.2 oz (85.4 kg)   SpO2 99%   BMI 40.73 kg/m  Wt Readings from Last 3 Encounters:  05/14/21 188 lb 3.2 oz (85.4 kg)  08/28/20 170 lb (77.1 kg)  04/17/20 171 lb (77.6 kg)    Physical Exam Vitals and nursing note reviewed.  Constitutional:      Appearance: She is well-developed.  HENT:     Head: Normocephalic and atraumatic.  Cardiovascular:     Rate and Rhythm: Normal rate and regular rhythm.     Heart sounds: Normal heart sounds. No murmur heard.   No friction rub. No gallop.  Pulmonary:     Effort: Pulmonary effort is normal. No tachypnea or respiratory distress.     Breath sounds: Normal breath sounds. No decreased breath sounds, wheezing, rhonchi or rales.  Chest:     Chest wall: No tenderness.  Abdominal:     General: Bowel sounds are normal.     Palpations: Abdomen is soft.  Musculoskeletal:        General: Normal range of motion.     Cervical back: Normal range of motion.  Skin:    General: Skin is warm and dry.  Neurological:     Mental Status: She is alert and oriented to person, place, and time.     Coordination: Coordination normal.  Psychiatric:        Behavior: Behavior normal. Behavior is cooperative.        Thought Content: Thought content normal.        Judgment: Judgment normal.         Patient has been counseled extensively about nutrition and exercise as well as the importance of adherence with medications and regular follow-up. The patient was given clear instructions to go to ER or return to medical center if symptoms don't improve, worsen or new problems develop. The patient verbalized understanding.   Follow-up: Return for See luke in 2 weeks BP CHECK. BMP. See me in 3 months.   [name], FNP-BC [facility] and [employer], [place]   05/15/2021, 7:44 PM

## 2021-05-15 ENCOUNTER — Other Ambulatory Visit: Payer: Self-pay | Admitting: Nurse Practitioner

## 2021-05-15 ENCOUNTER — Other Ambulatory Visit: Payer: Self-pay

## 2021-05-15 ENCOUNTER — Encounter: Payer: Self-pay | Admitting: Nurse Practitioner

## 2021-05-15 DIAGNOSIS — N1832 Chronic kidney disease, stage 3b: Secondary | ICD-10-CM

## 2021-05-15 LAB — LIPID PANEL
Chol/HDL Ratio: 5.8 ratio — ABNORMAL HIGH (ref 0.0–4.4)
Cholesterol, Total: 233 mg/dL — ABNORMAL HIGH (ref 100–199)
HDL: 40 mg/dL (ref >39)
LDL Chol Calc (NIH): 158 mg/dL — ABNORMAL HIGH (ref 0–99)
Triglycerides: 192 mg/dL — ABNORMAL HIGH (ref 0–149)
VLDL Cholesterol Cal: 35 mg/dL (ref 5–40)

## 2021-05-15 LAB — CMP14+EGFR
ALT: 31 IU/L (ref 0–32)
AST: 27 IU/L (ref 0–40)
Albumin/Globulin Ratio: 1.7 (ref 1.2–2.2)
Albumin: 4.5 g/dL (ref 3.8–4.8)
Alkaline Phosphatase: 173 IU/L — ABNORMAL HIGH (ref 44–121)
BUN/Creatinine Ratio: 15 (ref 9–23)
BUN: 30 mg/dL — ABNORMAL HIGH (ref 6–20)
Bilirubin Total: 0.2 mg/dL (ref 0.0–1.2)
CO2: 16 mmol/L — ABNORMAL LOW (ref 20–29)
Calcium: 9.3 mg/dL (ref 8.7–10.2)
Chloride: 108 mmol/L — ABNORMAL HIGH (ref 96–106)
Creatinine, Ser: 1.97 mg/dL — ABNORMAL HIGH (ref 0.57–1.00)
Globulin, Total: 2.6 g/dL (ref 1.5–4.5)
Glucose: 137 mg/dL — ABNORMAL HIGH (ref 65–99)
Potassium: 5.3 mmol/L — ABNORMAL HIGH (ref 3.5–5.2)
Sodium: 137 mmol/L (ref 134–144)
Total Protein: 7.1 g/dL (ref 6.0–8.5)
eGFR: 34 mL/min/{1.73_m2} — ABNORMAL LOW (ref >59)

## 2021-05-15 LAB — HEMOGLOBIN A1C
Est. average glucose Bld gHb Est-mCnc: 154 mg/dL
Hgb A1c MFr Bld: 7 % — ABNORMAL HIGH (ref 4.8–5.6)

## 2021-05-15 MED ORDER — ATORVASTATIN CALCIUM 20 MG PO TABS
20.0000 mg | ORAL_TABLET | Freq: Every day | ORAL | 3 refills | Status: DC
  Filled 2021-05-15: qty 30, 30d supply, fill #0

## 2021-05-21 ENCOUNTER — Other Ambulatory Visit: Payer: Self-pay

## 2021-05-24 IMAGING — US US RENAL
1 series · 1 of 1 positions shown · non-contrast
Comparison: None.

CLINICAL DATA: Acute Kidney Injury

EXAM:
RENAL / URINARY TRACT ULTRASOUND COMPLETE

[Series 1: us renal · 1 of 1 slices shown]
[im 1/1]
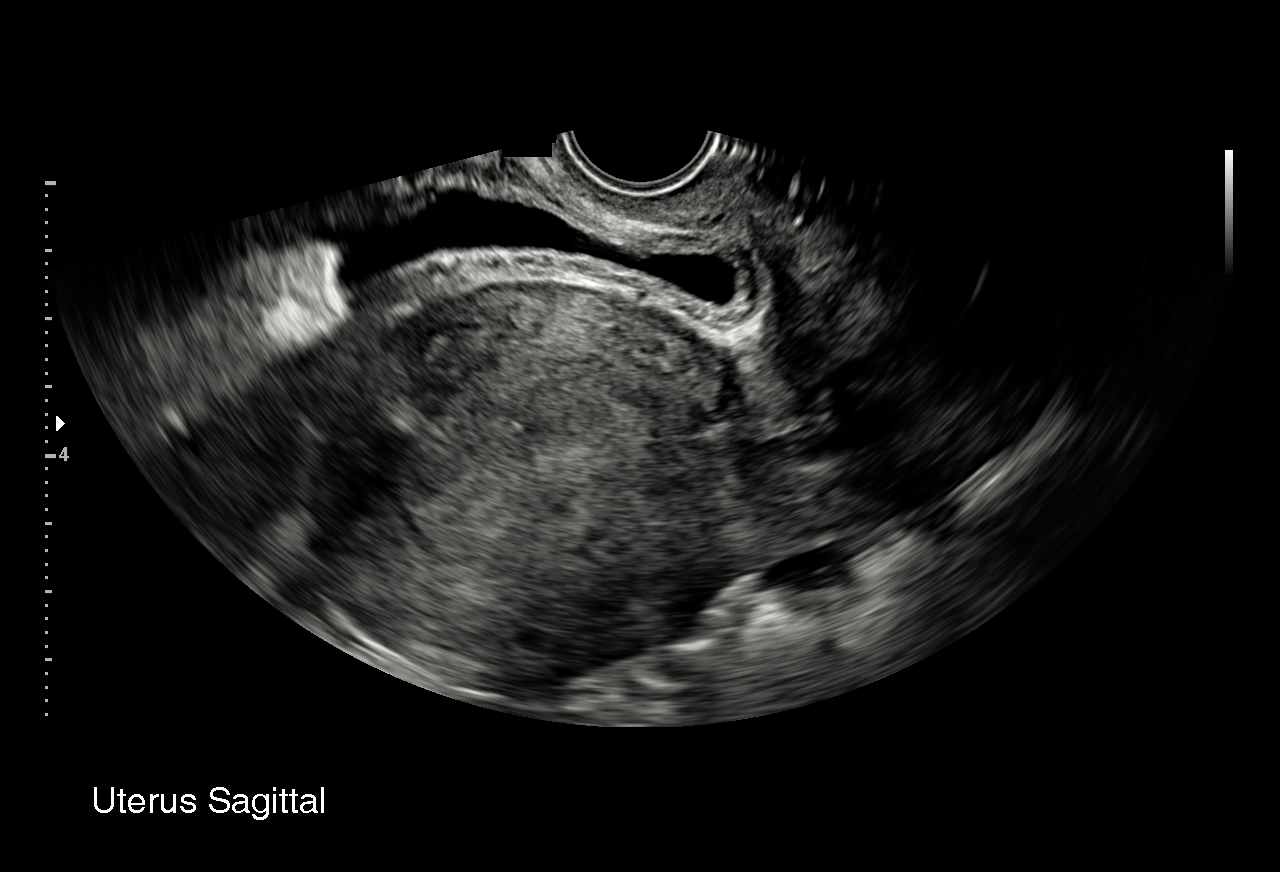

[1 of 1 positions shown; findings below may reference images not displayed]

FINDINGS: Right Kidney:

Renal measurements: 9 x 3.4 x 4.3 cm = volume: 65.6 mL .
Echogenicity within normal limits. No mass or hydronephrosis
visualized.

Left Kidney:

Renal measurements: 10 x 4.8 x 4 cm = volume: 100 mL. Echogenicity
within normal limits. No mass or hydronephrosis visualized.

Bladder:

Appears normal for degree of bladder distention.

Other:

Increased hepatic parenchymal echogenicity.
IMPRESSION: 1.  Normal renal ultrasound.

2. Increased hepatic parenchymal echogenicity as can be seen with
hepatic steatosis.

## 2021-06-15 ENCOUNTER — Other Ambulatory Visit: Payer: Self-pay

## 2021-06-18 ENCOUNTER — Ambulatory Visit: Payer: No Typology Code available for payment source | Admitting: Pharmacist

## 2021-06-22 ENCOUNTER — Other Ambulatory Visit: Payer: Self-pay

## 2021-06-25 ENCOUNTER — Other Ambulatory Visit: Payer: Self-pay

## 2021-06-25 ENCOUNTER — Ambulatory Visit: Payer: No Typology Code available for payment source | Attending: Nurse Practitioner | Admitting: Pharmacist

## 2021-06-25 ENCOUNTER — Encounter: Payer: Self-pay | Admitting: Pharmacist

## 2021-06-25 DIAGNOSIS — N1832 Chronic kidney disease, stage 3b: Secondary | ICD-10-CM

## 2021-06-25 DIAGNOSIS — R809 Proteinuria, unspecified: Secondary | ICD-10-CM

## 2021-06-25 NOTE — Progress Notes (Signed)
   S:    PCP: Zelda   Patient arrives in good spirits. Presents to the clinic for hypertension evaluation, counseling, and management. Patient was referred and last seen by Primary Care Provider on 05/14/2021. BP was 178/112 mmHg at that visit. Losartan dose was increased. Of note, pt's Nephrologist d/c'd nifedipine d/t proteinuria.   Medication adherence reported, however, sometimes she takes her AM dose of labetalol at 12pm.   Current BP Medications include:  labetalol 300 mg (takes '600mg'$ ) BID, losartan 50 mg daily  Dietary habits include: denies adding salt to foods. Drinks coffee once daily in the mornings. Denies excessive caffeine intake.  Exercise habits include: none Family / Social history:  - Fhx: DM - Tobacco: never smoker  - Alcohol: denies use   O:  Vitals:   06/25/21 1403  BP: (!) 172/106   Home BP readings: none  Last 3 Office BP readings: BP Readings from Last 3 Encounters:  06/25/21 (!) 172/106  05/14/21 (!) 178/112  08/28/20 136/87    BMET    Component Value Date/Time   NA 137 05/14/2021 1630   K 5.3 (H) 05/14/2021 1630   CL 108 (H) 05/14/2021 1630   CO2 16 (L) 05/14/2021 1630   GLUCOSE 137 (H) 05/14/2021 1630   GLUCOSE 106 (H) 07/05/2019 0529   BUN 30 (H) 05/14/2021 1630   CREATININE 1.97 (H) 05/14/2021 1630   CALCIUM 9.3 05/14/2021 1630   GFRNONAA 31 (L) 08/28/2020 1117   GFRAA 36 (L) 08/28/2020 1117    Renal function: CrCl cannot be calculated (Patient's most recent lab result is older than the maximum 21 days allowed.).  Clinical ASCVD: No  The ASCVD Risk score (Arnett DK, et al., 2019) failed to calculate for the following reasons:   The 2019 ASCVD risk score is only valid for ages 41 to 89   A/P: Hypertension longstanding currently uncontrolled on current medications. BP Goal = < 130/80 mmHg. KDIGO supports a BP goal of <120 mmHg in non-dialysis patients. Medication adherence reported. I have advised her to take labetalol in the morning at  ~7AM since she takes her second dose ~7 PM. Will wait or lab results before further titrating losartan dose.   -Continued current regimen for now.  -F/u labs ordered - per PCP -Counseled on lifestyle modifications for blood pressure control including reduced dietary sodium, increased exercise, adequate sleep.  Results reviewed and written information provided.   Total time in face-to-face counseling 20 minutes.   F/U Clinic Visit in 1 month.   Benard Halsted, PharmD, [place], [employer] [phone]

## 2021-06-26 LAB — URINALYSIS, COMPLETE
Bilirubin, UA: NEGATIVE
Glucose, UA: NEGATIVE
Ketones, UA: NEGATIVE
Leukocytes,UA: NEGATIVE
Nitrite, UA: NEGATIVE
Specific Gravity, UA: 1.01 (ref 1.005–1.030)
Urobilinogen, Ur: 0.2 mg/dL (ref 0.2–1.0)
pH, UA: 6 (ref 5.0–7.5)

## 2021-06-26 LAB — CMP14+EGFR
ALT: 23 IU/L (ref 0–32)
AST: 20 IU/L (ref 0–40)
Albumin/Globulin Ratio: 1.5 (ref 1.2–2.2)
Albumin: 4.3 g/dL (ref 3.8–4.8)
Alkaline Phosphatase: 143 IU/L — ABNORMAL HIGH (ref 44–121)
BUN/Creatinine Ratio: 18 (ref 9–23)
BUN: 38 mg/dL — ABNORMAL HIGH (ref 6–20)
Bilirubin Total: <0.2 mg/dL (ref 0.0–1.2)
CO2: 17 mmol/L — ABNORMAL LOW (ref 20–29)
Calcium: 9.3 mg/dL (ref 8.7–10.2)
Chloride: 109 mmol/L — ABNORMAL HIGH (ref 96–106)
Creatinine, Ser: 2.15 mg/dL — ABNORMAL HIGH (ref 0.57–1.00)
Globulin, Total: 2.9 g/dL (ref 1.5–4.5)
Glucose: 122 mg/dL — ABNORMAL HIGH (ref 65–99)
Potassium: 5.3 mmol/L — ABNORMAL HIGH (ref 3.5–5.2)
Sodium: 138 mmol/L (ref 134–144)
Total Protein: 7.2 g/dL (ref 6.0–8.5)
eGFR: 31 mL/min/{1.73_m2} — ABNORMAL LOW (ref >59)

## 2021-06-26 LAB — MICROSCOPIC EXAMINATION
Bacteria, UA: NONE SEEN
Casts: NONE SEEN /lpf
Epithelial Cells (non renal): NONE SEEN /hpf (ref 0–10)
RBC, Urine: NONE SEEN /hpf (ref 0–2)
WBC, UA: NONE SEEN /hpf (ref 0–5)

## 2021-06-28 NOTE — Progress Notes (Signed)
Called pt and left a vm to return call to the office.  

## 2021-07-02 NOTE — Progress Notes (Signed)
Called pt and informed her of her kidney and liver function. She said she is going to make appointment this month with [facility]. Translator Erlene Quan [phone] was used.

## 2021-07-23 ENCOUNTER — Ambulatory Visit: Payer: No Typology Code available for payment source | Admitting: Pharmacist

## 2021-08-03 ENCOUNTER — Other Ambulatory Visit: Payer: Self-pay | Admitting: Nurse Practitioner

## 2021-08-03 DIAGNOSIS — I10 Essential (primary) hypertension: Secondary | ICD-10-CM

## 2021-08-06 ENCOUNTER — Other Ambulatory Visit: Payer: Self-pay

## 2021-08-06 MED ORDER — LOSARTAN POTASSIUM 50 MG PO TABS
50.0000 mg | ORAL_TABLET | Freq: Every day | ORAL | 1 refills | Status: DC
Start: 2021-08-06 — End: 2021-10-12
  Filled 2021-08-06: qty 30, 30d supply, fill #0
  Filled 2021-08-13: qty 60, 60d supply, fill #0

## 2021-08-07 ENCOUNTER — Other Ambulatory Visit: Payer: Self-pay

## 2021-08-13 ENCOUNTER — Encounter: Payer: Self-pay | Admitting: Nurse Practitioner

## 2021-08-13 ENCOUNTER — Ambulatory Visit: Payer: No Typology Code available for payment source | Attending: Nurse Practitioner | Admitting: Nurse Practitioner

## 2021-08-13 ENCOUNTER — Other Ambulatory Visit: Payer: Self-pay

## 2021-08-13 VITALS — BP 141/85 | HR 78 | Ht <= 58 in | Wt 186.1 lb

## 2021-08-13 DIAGNOSIS — E1165 Type 2 diabetes mellitus with hyperglycemia: Secondary | ICD-10-CM

## 2021-08-13 DIAGNOSIS — I10 Essential (primary) hypertension: Secondary | ICD-10-CM

## 2021-08-13 LAB — POCT GLYCOSYLATED HEMOGLOBIN (HGB A1C): Hemoglobin A1C: 6.9 % — AB (ref 4.0–5.6)

## 2021-08-13 LAB — GLUCOSE, POCT (MANUAL RESULT ENTRY): POC Glucose: 141 mg/dl — AB (ref 70–99)

## 2021-08-13 MED ORDER — LOSARTAN POTASSIUM 50 MG PO TABS
50.0000 mg | ORAL_TABLET | Freq: Every day | ORAL | 1 refills | Status: DC
  Filled 2021-08-13: qty 90, 90d supply, fill #0

## 2021-08-13 MED ORDER — LABETALOL HCL 300 MG PO TABS
ORAL_TABLET | ORAL | 1 refills | Status: DC
  Filled 2021-11-14: qty 360, fill #0
  Filled 2021-11-15: qty 360, 90d supply, fill #0

## 2021-08-13 NOTE — Progress Notes (Signed)
Assessment & Plan:  Renee Porter was seen today for hypertension.  Diagnoses and all orders for this visit:  Type 2 diabetes mellitus with hyperglycemia, without long-term current use of insulin (HCC) -     POCT glucose (manual entry) -     POCT glycosylated hemoglobin (Hb A1C) -     CMP14+EGFR Continue blood sugar control as discussed in office today, low carbohydrate diet, and regular physical exercise as tolerated, 150 minutes per week (30 min each day, 5 days per week, or 50 min 3 days per week). Keep blood sugar logs with fasting goal of 90-130 mg/dl, post prandial (after you eat) less than 180.  For Hypoglycemia: BS <60 and Hyperglycemia BS >400; contact the clinic ASAP. Annual eye exams and foot exams are recommended.   Primary hypertension -     labetalol (NORMODYNE) 300 MG tablet; TAKE 2 TABLETS (600 MG TOTAL) BY MOUTH 2 (TWO) TIMES DAILY. -     losartan (COZAAR) 50 MG tablet; Take 1 tablet (50 mg total) by mouth daily. -     CMP14+EGFR Continue all antihypertensives as prescribed.  Remember to bring in your blood pressure log with you for your follow up appointment.  DASH/Mediterranean Diets are healthier choices for HTN.     Patient has been counseled on age-appropriate routine health concerns for screening and prevention. These are reviewed and up-to-date. Referrals have been placed accordingly. Immunizations are up-to-date or declined.    Subjective:   Chief Complaint  Patient presents with   Hypertension    Hypertension Pertinent negatives include no blurred vision, chest pain, headaches, malaise/fatigue, palpitations or shortness of breath.  Renee Porter 35 y.o. female presents to office today for HTN and Diabetes.   HTN She is not monitoring her blood pressure at home. Currently taking labetalol 600 mg BID and losartan 50 mg daily. She has an appointment with nephrology in a few weeks. Blood pressure is not optimal today. Her nifedipine was dc'd due to proteinuria.  She will continue on labetalol 600 mg BID and losartan 50 mg daily.  BP Readings from Last 3 Encounters:  08/13/21 (!) 141/85  06/25/21 (!) 172/106  05/14/21 (!) 178/112    Lab Results  Component Value Date   CREATININE 2.15 (H) 06/25/2021    DM 2 Well controlled at this time. Recommended weight loss of minimum 10lbs prior to next office visit. Lab Results  Component Value Date   HGBA1C 6.9 (A) 08/13/2021    Review of Systems  Constitutional:  Negative for fever, malaise/fatigue and weight loss.  HENT: Negative.  Negative for nosebleeds.   Eyes: Negative.  Negative for blurred vision, double vision and photophobia.  Respiratory: Negative.  Negative for cough and shortness of breath.   Cardiovascular: Negative.  Negative for chest pain, palpitations and leg swelling.  Gastrointestinal: Negative.  Negative for heartburn, nausea and vomiting.  Musculoskeletal: Negative.  Negative for myalgias.  Neurological: Negative.  Negative for dizziness, focal weakness, seizures and headaches.  Psychiatric/Behavioral: Negative.  Negative for suicidal ideas.    Past Medical History:  Diagnosis Date   Hypertension    Prediabetes     Past Surgical History:  Procedure Laterality Date   CESAREAN SECTION     FOOT SURGERY      Family History  Problem Relation Age of Onset   Diabetes Father     Social History Reviewed with no changes to be made today.   Outpatient Medications Prior to Visit  Medication Sig Dispense Refill  acetaminophen (TYLENOL) 325 MG tablet Take 325 mg by mouth every 6 (six) hours as needed for moderate pain or headache.     atorvastatin (LIPITOR) 20 MG tablet Take 1 tablet (20 mg total) by mouth daily. 90 tablet 3   Prenatal Vit-Fe Fumarate-FA (PRENATAL VITAMINS PO) Take 1 tablet by mouth daily in the afternoon.     labetalol (NORMODYNE) 300 MG tablet TAKE 2 TABLETS (600 MG TOTAL) BY MOUTH 2 (TWO) TIMES DAILY. 360 tablet 1   losartan (COZAAR) 50 MG tablet Take 1  tablet (50 mg total) by mouth daily. 30 tablet 1   No facility-administered medications prior to visit.    No Known Allergies     Objective:    BP (!) 141/85   Pulse 78   Ht _0  (1.448 m)   Wt 186 lb 2 oz (84.4 kg)   SpO2 100%   BMI 40.28 kg/m  Wt Readings from Last 3 Encounters:  08/13/21 186 lb 2 oz (84.4 kg)  05/14/21 188 lb 3.2 oz (85.4 kg)  08/28/20 170 lb (77.1 kg)    Physical Exam Vitals and nursing note reviewed.  Constitutional:      Appearance: She is well-developed.  HENT:     Head: Normocephalic and atraumatic.  Cardiovascular:     Rate and Rhythm: Normal rate and regular rhythm.     Heart sounds: Normal heart sounds. No murmur heard.   No friction rub. No gallop.  Pulmonary:     Effort: Pulmonary effort is normal. No tachypnea or respiratory distress.     Breath sounds: Normal breath sounds. No decreased breath sounds, wheezing, rhonchi or rales.  Chest:     Chest wall: No tenderness.  Abdominal:     General: Bowel sounds are normal.     Palpations: Abdomen is soft.  Musculoskeletal:        General: Normal range of motion.     Cervical back: Normal range of motion.  Skin:    General: Skin is warm and dry.  Neurological:     Mental Status: She is alert and oriented to person, place, and time.     Coordination: Coordination normal.  Psychiatric:        Behavior: Behavior normal. Behavior is cooperative.        Thought Content: Thought content normal.        Judgment: Judgment normal.         Patient has been counseled extensively about nutrition and exercise as well as the importance of adherence with medications and regular follow-up. The patient was given clear instructions to go to ER or return to medical center if symptoms don't improve, worsen or new problems develop. The patient verbalized understanding.   Follow-up: Return in about 3 months (around 11/13/2021) for HTN and DM.   [name], FNP-BC [facility] and  [facility] [place], [place]   08/13/2021, 10:23 AM

## 2021-08-14 LAB — CMP14+EGFR
ALT: 28 IU/L (ref 0–32)
AST: 22 IU/L (ref 0–40)
Albumin/Globulin Ratio: 1.9 (ref 1.2–2.2)
Albumin: 4.6 g/dL (ref 3.8–4.8)
Alkaline Phosphatase: 157 IU/L — ABNORMAL HIGH (ref 44–121)
BUN/Creatinine Ratio: 14 (ref 9–23)
BUN: 37 mg/dL — ABNORMAL HIGH (ref 6–20)
Bilirubin Total: 0.2 mg/dL (ref 0.0–1.2)
CO2: 17 mmol/L — ABNORMAL LOW (ref 20–29)
Calcium: 9.5 mg/dL (ref 8.7–10.2)
Chloride: 111 mmol/L — ABNORMAL HIGH (ref 96–106)
Creatinine, Ser: 2.58 mg/dL — ABNORMAL HIGH (ref 0.57–1.00)
Globulin, Total: 2.4 g/dL (ref 1.5–4.5)
Glucose: 132 mg/dL — ABNORMAL HIGH (ref 70–99)
Potassium: 5.6 mmol/L — ABNORMAL HIGH (ref 3.5–5.2)
Sodium: 139 mmol/L (ref 134–144)
Total Protein: 7 g/dL (ref 6.0–8.5)
eGFR: 25 mL/min/{1.73_m2} — ABNORMAL LOW (ref >59)

## 2021-08-21 ENCOUNTER — Telehealth: Payer: Self-pay

## 2021-08-21 NOTE — Telephone Encounter (Signed)
Left message to return call to our office.  

## 2021-08-21 NOTE — Telephone Encounter (Signed)
Left message to return call to our office, and sent letter to address on file.

## 2021-08-21 NOTE — Telephone Encounter (Signed)
-----   Message from [name], NP sent at 08/15/2021  7:00 PM EST ----- Follow up with nephrology for elevated creatinine. Potassium is also elevated as well as liver enzymes. Stop cholesterol medication at this time.

## 2021-10-12 ENCOUNTER — Other Ambulatory Visit: Payer: Self-pay | Admitting: Family Medicine

## 2021-10-12 DIAGNOSIS — I10 Essential (primary) hypertension: Secondary | ICD-10-CM

## 2021-10-12 MED ORDER — LOSARTAN POTASSIUM 50 MG PO TABS
50.0000 mg | ORAL_TABLET | Freq: Every day | ORAL | 0 refills | Status: DC
  Filled 2021-10-12: qty 90, 90d supply, fill #0
  Filled 2021-10-12: qty 30, 30d supply, fill #0
  Filled 2021-11-14: qty 30, 30d supply, fill #1
  Filled 2021-11-19: qty 60, 60d supply, fill #1

## 2021-10-12 NOTE — Telephone Encounter (Signed)
Requested Prescriptions  Pending Prescriptions Disp Refills   losartan (COZAAR) 50 MG tablet 30 tablet 1    Sig: Take 1 tablet (50 mg total) by mouth daily.     Cardiovascular:  Angiotensin Receptor Blockers Failed - 10/12/2021  4:49 PM      Failed - Cr in normal range and within 180 days    Creatinine, Ser  Date Value Ref Range Status  08/13/2021 2.58 (H) 0.57 - 1.00 mg/dL Final   Creatinine, Urine  Date Value Ref Range Status  06/28/2019 22.76 mg/dL Final         Failed - K in normal range and within 180 days    Potassium  Date Value Ref Range Status  08/13/2021 5.6 (H) 3.5 - 5.2 mmol/L Final         Failed - Last BP in normal range    BP Readings from Last 1 Encounters:  08/13/21 (!) 141/85         Passed - Patient is not pregnant      Passed - Valid encounter within last 6 months    Recent Outpatient Visits          2 months ago Type 2 diabetes mellitus with hyperglycemia, without long-term current use of insulin ([place])   [place], Vernia Buff, NP   3 months ago Urine protein increased   [place], Jarome Matin, RPH-CPP   5 months ago Primary hypertension   [place], Vernia Buff, NP   1 year ago Essential hypertension   [place], Vernia Buff, NP   1 year ago Encounter for Papanicolaou smear for cervical cancer screening   [place], Vernia Buff, NP      Future Appointments            In 1 month [name], NP [place]

## 2021-10-13 ENCOUNTER — Other Ambulatory Visit: Payer: Self-pay

## 2021-10-15 ENCOUNTER — Other Ambulatory Visit: Payer: Self-pay

## 2021-10-19 IMAGING — US US ABDOMEN COMPLETE
1 series · 13 of 25 positions shown · non-contrast
Comparison: Ultrasound right upper quadrant July 04, 2019

CLINICAL DATA: Elevated liver enzymes

EXAM:
ABDOMEN ULTRASOUND COMPLETE

[Series 1: us abdomen complete · 0.15mm/px · 13 of 76 slices shown]
[im 1/76]
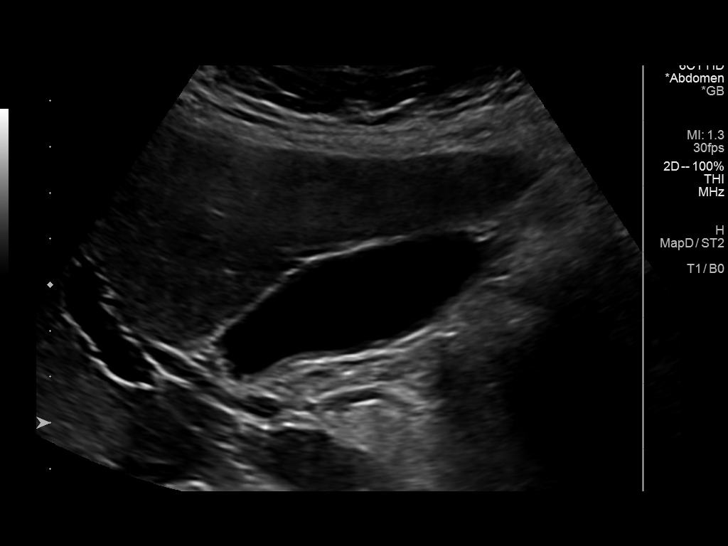
[im 7/76]
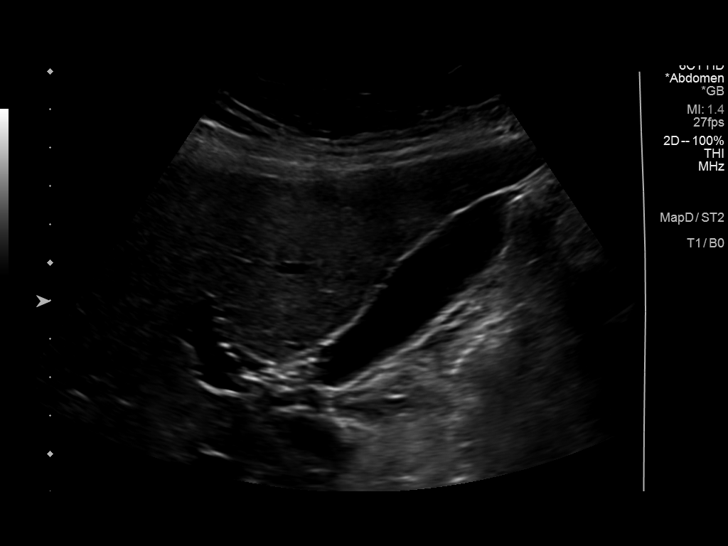
[im 13/76]
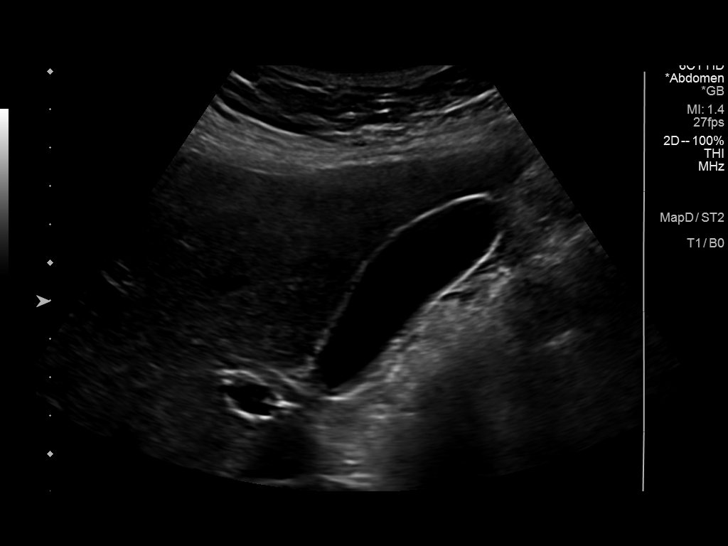
[im 19/76]
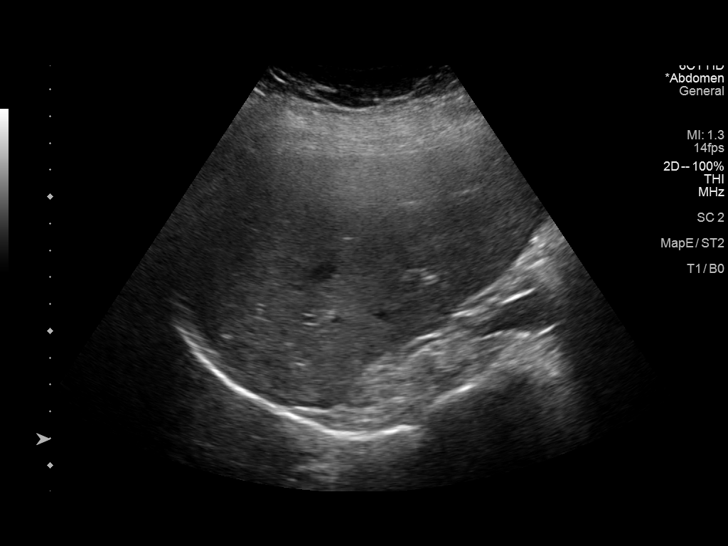
[im 26/76]
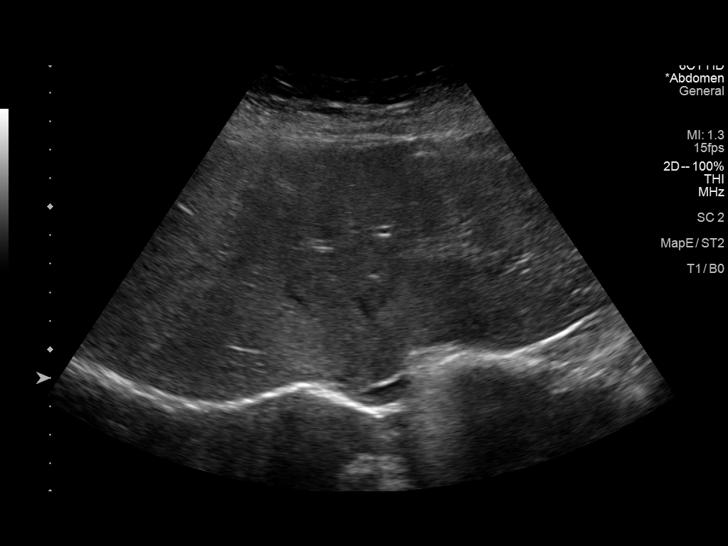
[im 32/76]
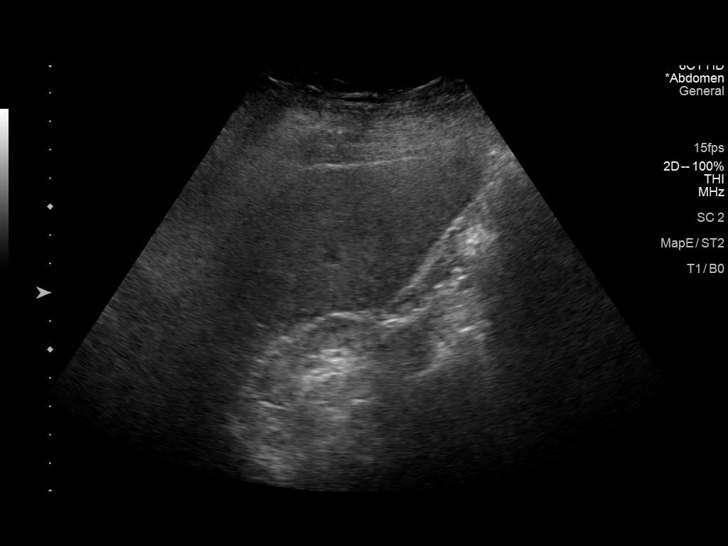
[im 38/76]
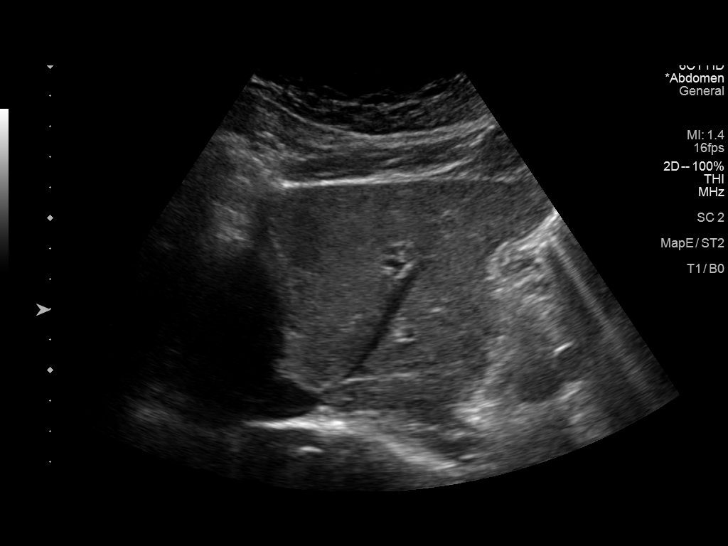
[im 44/76]
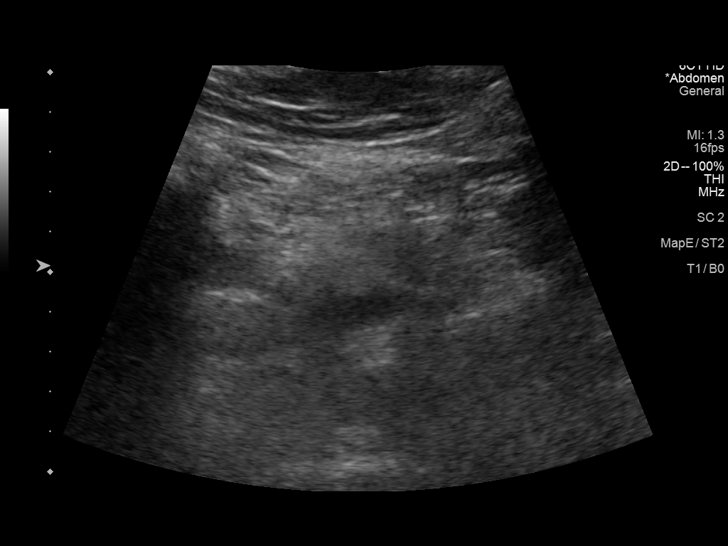
[im 51/76]
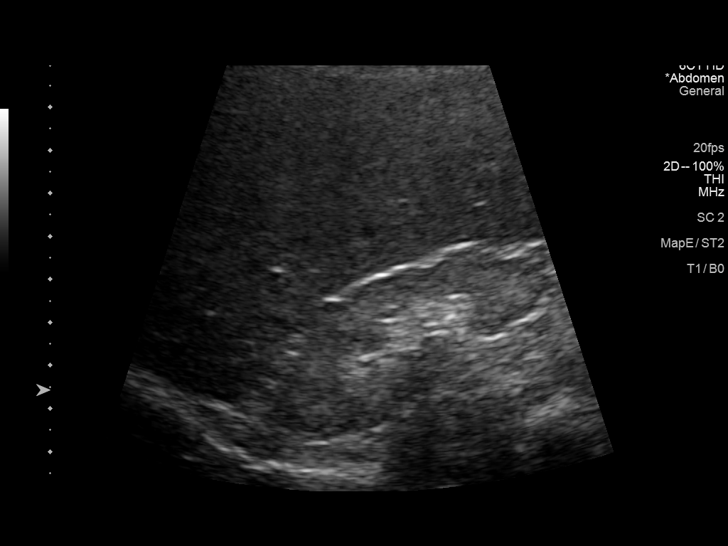
[im 57/76]
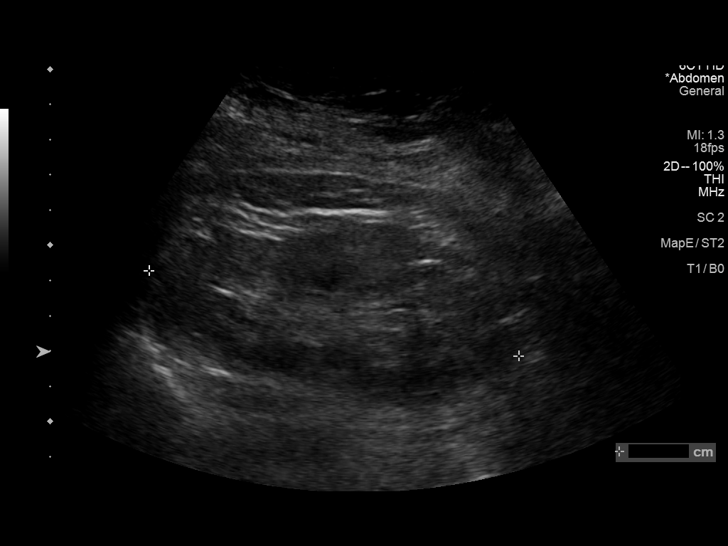
[im 63/76]
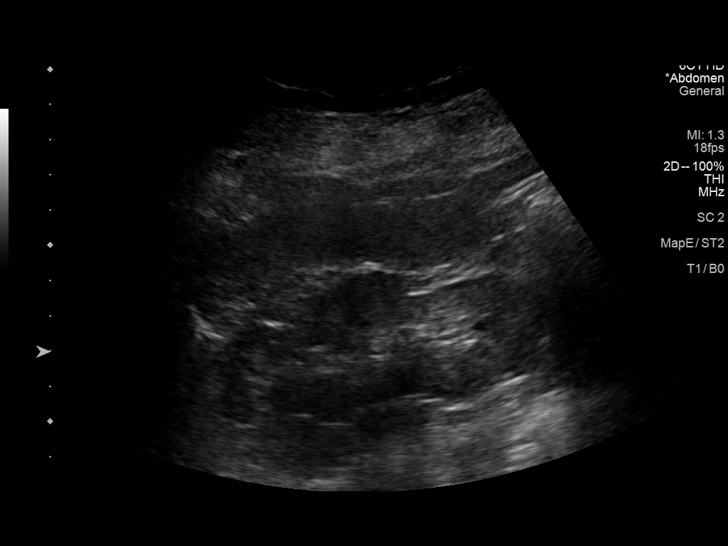
[im 69/76]
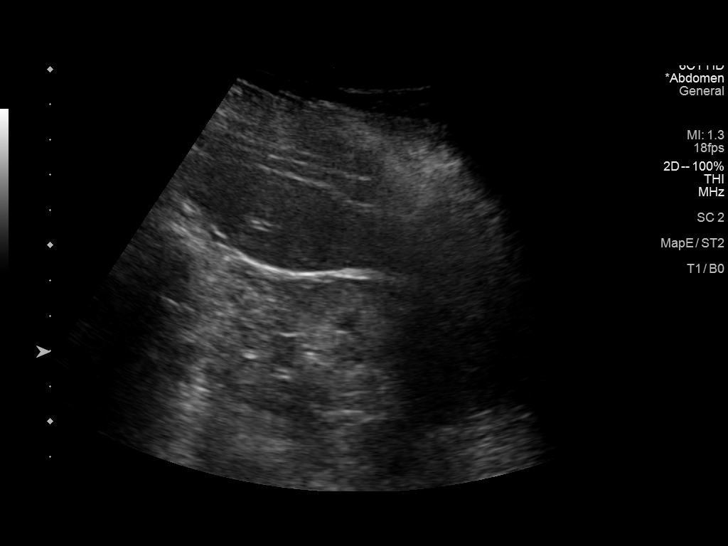
[im 76/76]
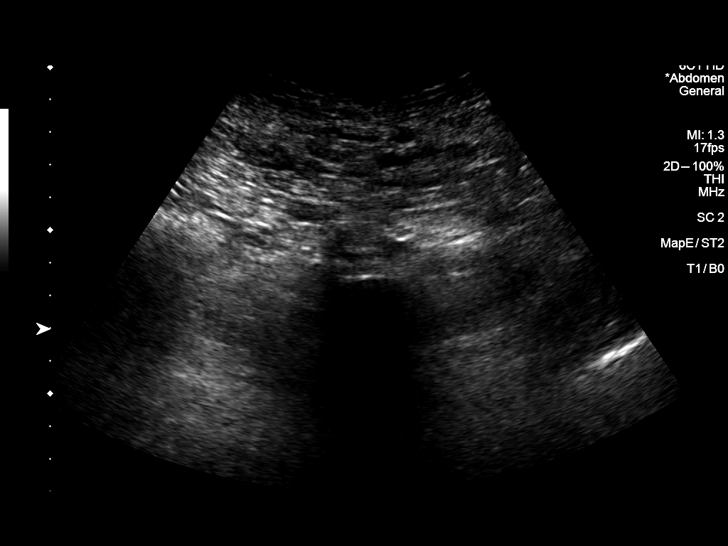

[13 of 25 positions shown; findings below may reference images not displayed]

FINDINGS: Gallbladder: No gallstones or wall thickening visualized. There is
no pericholecystic fluid. No sonographic Murphy sign noted by
sonographer.

Common bile duct: Diameter: 4 mm. No intrahepatic, common hepatic,
or common bile duct dilatation.

Liver: No focal lesion identified. Within normal limits in
parenchymal echogenicity. Portal vein is patent on color Doppler
imaging with normal direction of blood flow towards the liver.

IVC: No abnormality visualized.

Pancreas: Visualized portion unremarkable. Portions of pancreas
obscured by gas.

Spleen: Size and appearance within normal limits.

Right Kidney: Length: 7.4 cm. Echogenicity is increased. Renal
cortical thickness within normal limits. No mass or hydronephrosis
visualized.

Left Kidney: Length: 10.7 cm. Echogenicity within normal limits. No
mass or hydronephrosis visualized. Areas of cortical irregularity
likely represent a degree of scarring.

Abdominal aorta: No aneurysm visualized.

Other findings: No evident ascites.
IMPRESSION: 1. Small, mildly echogenic right kidney. No obstructing focus.
Reason for relative right renal atrophy uncertain. Residua of prior
infection could present in this manner. This is a finding that
potentially could be indicative of renal artery stenosis. In this
regard, question whether patient is hypertensive. Left kidney
appears normal except for areas of suspected scarring.

2. Portions of pancreas obscured by gas. Visualized portions of
pancreas appear normal.

3.  Study otherwise unremarkable.

## 2021-11-12 ENCOUNTER — Ambulatory Visit: Payer: No Typology Code available for payment source | Admitting: Nurse Practitioner

## 2021-11-15 ENCOUNTER — Other Ambulatory Visit: Payer: Self-pay

## 2021-11-16 ENCOUNTER — Other Ambulatory Visit: Payer: Self-pay

## 2021-11-19 ENCOUNTER — Other Ambulatory Visit: Payer: Self-pay

## 2022-01-14 ENCOUNTER — Encounter: Payer: Self-pay | Admitting: Nurse Practitioner

## 2022-01-14 ENCOUNTER — Ambulatory Visit: Payer: No Typology Code available for payment source | Attending: Nurse Practitioner | Admitting: Nurse Practitioner

## 2022-01-14 ENCOUNTER — Other Ambulatory Visit: Payer: Self-pay

## 2022-01-14 VITALS — BP 161/103 | HR 79 | Wt 187.2 lb

## 2022-01-14 DIAGNOSIS — N184 Chronic kidney disease, stage 4 (severe): Secondary | ICD-10-CM

## 2022-01-14 DIAGNOSIS — I10 Essential (primary) hypertension: Secondary | ICD-10-CM

## 2022-01-14 DIAGNOSIS — E1165 Type 2 diabetes mellitus with hyperglycemia: Secondary | ICD-10-CM

## 2022-01-14 DIAGNOSIS — E785 Hyperlipidemia, unspecified: Secondary | ICD-10-CM

## 2022-01-14 MED ORDER — ATORVASTATIN CALCIUM 20 MG PO TABS
20.0000 mg | ORAL_TABLET | Freq: Every day | ORAL | 3 refills | Status: DC
  Filled 2022-01-14: qty 90, 90d supply, fill #0
  Filled 2022-05-27: qty 90, 90d supply, fill #1
  Filled 2022-08-23: qty 90, 90d supply, fill #2
  Filled 2022-10-03 – 2022-12-09 (×3): qty 90, 90d supply, fill #3

## 2022-01-14 MED ORDER — LABETALOL HCL 300 MG PO TABS
ORAL_TABLET | ORAL | 1 refills | Status: DC
  Filled 2022-01-14: qty 360, fill #0
  Filled 2022-02-18: qty 360, 90d supply, fill #0
  Filled 2022-05-27: qty 120, 30d supply, fill #1
  Filled 2022-06-24: qty 120, 30d supply, fill #2

## 2022-01-14 MED ORDER — LOSARTAN POTASSIUM 50 MG PO TABS
50.0000 mg | ORAL_TABLET | Freq: Every day | ORAL | 1 refills | Status: DC
  Filled 2022-01-14: qty 90, 90d supply, fill #0
  Filled 2022-03-22: qty 90, 90d supply, fill #1

## 2022-01-14 NOTE — Progress Notes (Signed)
? ?Assessment & Plan:  ?Renee Porter was seen today for hypertension and medication refill. ? ?Diagnoses and all orders for this visit: ? ?Primary hypertension ?-     labetalol (NORMODYNE) 300 MG tablet; TAKE 2 TABLETS (600 MG TOTAL) BY MOUTH 2 (TWO) TIMES DAILY. ?-     losartan (COZAAR) 50 MG tablet; Take 1 tablet (50 mg total) by mouth daily. ?Continue all antihypertensives as prescribed.  ?Remember to bring in your blood pressure log with you for your follow up appointment.  ?DASH/Mediterranean Diets are healthier choices for HTN.   ? ?Stage 4 chronic kidney disease  ?F/U with CKD as instructed on 01-30-2022 ? ?Type 2 diabetes mellitus with hyperglycemia, without long-term current use of insulin ([place]) ?-     Hemoglobin A1c ? ?Dyslipidemia, goal LDL below 70 ?-     atorvastatin (LIPITOR) 20 MG tablet; Take 1 tablet (20 mg total) by mouth daily. ?-     Lipid panel ? ? ? ? ?Patient has been counseled on age-appropriate routine health concerns for screening and prevention. These are reviewed and up-to-date. Referrals have been placed accordingly. Immunizations are up-to-date or declined.    ?Subjective:  ? ?Chief Complaint  ?Patient presents with  ? Hypertension  ? Medication Refill  ? ?HPI ?Renee Porter 36 y.o. female presents to office today for follow up to HTN.  ?VRI was used to communicate directly with patient for the entire encounter including providing detailed patient instructions.   ? ? ?HTN ?Blood pressure is elevated. She has CKD stage 4 and is currently being followed by Nephrology. She is taking labetalol  600 mg BID and losartan 50 mg daily. I will have her check her blood pressure via virtual visit tomorrow to see what her numbers are at home. She has a monitoring device but does not "know what the numbers mean" on the machine.  ?BP Readings from Last 3 Encounters:  ?01/14/22 (!) 161/103  ?08/13/21 (!) 141/85  ?06/25/21 (!) 172/106  ?   ? ?DM 2 ?Well controlled. Will obtain A1c today. LDL not at goal  with atorvastatin 20 mg. We may need to increase based on future lipid panel results. ?Lab Results  ?Component Value Date  ? HGBA1C 6.9 (A) 08/13/2021  ? ?Lab Results  ?Component Value Date  ? LDLCALC 158 (H) 05/14/2021  ?  ? ?Review of Systems  ?Constitutional:  Negative for fever, malaise/fatigue and weight loss.  ?HENT: Negative.  Negative for nosebleeds.   ?Eyes: Negative.  Negative for blurred vision, double vision and photophobia.  ?Respiratory: Negative.  Negative for cough and shortness of breath.   ?Cardiovascular: Negative.  Negative for chest pain, palpitations and leg swelling.  ?Gastrointestinal: Negative.  Negative for heartburn, nausea and vomiting.  ?Musculoskeletal: Negative.  Negative for myalgias.  ?Neurological: Negative.  Negative for dizziness, focal weakness, seizures and headaches.  ?Psychiatric/Behavioral: Negative.  Negative for suicidal ideas.   ? ?Past Medical History:  ?Diagnosis Date  ? Hypertension   ? Prediabetes   ? ? ?Past Surgical History:  ?Procedure Laterality Date  ? CESAREAN SECTION    ? FOOT SURGERY    ? ? ?Family History  ?Problem Relation Age of Onset  ? Diabetes Father   ? ? ?Social History Reviewed with no changes to be made today.  ? ?Outpatient Medications Prior to Visit  ?Medication Sig Dispense Refill  ? acetaminophen (TYLENOL) 325 MG tablet Take 325 mg by mouth every 6 (six) hours as needed for moderate pain or headache.    ?  Prenatal Vit-Fe Fumarate-FA (PRENATAL VITAMINS PO) Take 1 tablet by mouth daily in the afternoon.    ? atorvastatin (LIPITOR) 20 MG tablet Take 1 tablet (20 mg total) by mouth daily. 90 tablet 3  ? labetalol (NORMODYNE) 300 MG tablet TAKE 2 TABLETS (600 MG TOTAL) BY MOUTH 2 (TWO) TIMES DAILY. 360 tablet 1  ? losartan (COZAAR) 50 MG tablet Take 1 tablet (50 mg total) by mouth daily. 90 tablet 0  ? losartan (COZAAR) 50 MG tablet Take 1 tablet (50 mg total) by mouth daily. 90 tablet 1  ? ?No facility-administered medications prior to visit.  ? ? ?No  Known Allergies ? ?   ?Objective:  ?  ?BP (!) 161/103   Pulse 79   Wt 187 lb 3.2 oz (84.9 kg)   SpO2 100%   BMI 40.51 kg/m?  ?Wt Readings from Last 3 Encounters:  ?01/14/22 187 lb 3.2 oz (84.9 kg)  ?08/13/21 186 lb 2 oz (84.4 kg)  ?05/14/21 188 lb 3.2 oz (85.4 kg)  ? ? ?Physical Exam ?Vitals and nursing note reviewed.  ?Constitutional:   ?   Appearance: She is well-developed.  ?HENT:  ?   Head: Normocephalic and atraumatic.  ?Cardiovascular:  ?   Rate and Rhythm: Normal rate and regular rhythm.  ?   Heart sounds: Normal heart sounds. No murmur heard. ?  No friction rub. No gallop.  ?Pulmonary:  ?   Effort: Pulmonary effort is normal. No tachypnea or respiratory distress.  ?   Breath sounds: Normal breath sounds. No decreased breath sounds, wheezing, rhonchi or rales.  ?Chest:  ?   Chest wall: No tenderness.  ?Abdominal:  ?   General: Bowel sounds are normal.  ?   Palpations: Abdomen is soft.  ?Musculoskeletal:     ?   General: Normal range of motion.  ?   Cervical back: Normal range of motion.  ?Skin: ?   General: Skin is warm and dry.  ?Neurological:  ?   Mental Status: She is alert and oriented to person, place, and time.  ?   Coordination: Coordination normal.  ?Psychiatric:     ?   Behavior: Behavior normal. Behavior is cooperative.     ?   Thought Content: Thought content normal.     ?   Judgment: Judgment normal.  ? ? ? ? ?   ?Patient has been counseled extensively about nutrition and exercise as well as the importance of adherence with medications and regular follow-up. The patient was given clear instructions to go to ER or return to medical center if symptoms don't improve, worsen or new problems develop. The patient verbalized understanding.  ? ?Follow-up: Return in about 3 months (around 04/15/2022).  ? ?[name], FNP-BC ?[place] ?[place], [place] ?[phone]   ?01/14/2022, 2:43 PM ?

## 2022-01-15 ENCOUNTER — Other Ambulatory Visit: Payer: Self-pay

## 2022-01-15 ENCOUNTER — Ambulatory Visit (HOSPITAL_BASED_OUTPATIENT_CLINIC_OR_DEPARTMENT_OTHER): Payer: No Typology Code available for payment source | Admitting: Nurse Practitioner

## 2022-01-15 ENCOUNTER — Other Ambulatory Visit: Payer: Self-pay | Admitting: Nurse Practitioner

## 2022-01-15 DIAGNOSIS — I10 Essential (primary) hypertension: Secondary | ICD-10-CM

## 2022-01-15 LAB — LIPID PANEL
Chol/HDL Ratio: 6 ratio — ABNORMAL HIGH (ref 0.0–4.4)
Cholesterol, Total: 211 mg/dL — ABNORMAL HIGH (ref 100–199)
HDL: 35 mg/dL — ABNORMAL LOW (ref >39)
LDL Chol Calc (NIH): 142 mg/dL — ABNORMAL HIGH (ref 0–99)
Triglycerides: 186 mg/dL — ABNORMAL HIGH (ref 0–149)
VLDL Cholesterol Cal: 34 mg/dL (ref 5–40)

## 2022-01-15 LAB — HEMOGLOBIN A1C
Est. average glucose Bld gHb Est-mCnc: 143 mg/dL
Hgb A1c MFr Bld: 6.6 % — ABNORMAL HIGH (ref 4.8–5.6)

## 2022-01-15 MED ORDER — CLONIDINE 0.1 MG/24HR TD PTWK
0.1000 mg | MEDICATED_PATCH | TRANSDERMAL | 12 refills | Status: DC
  Filled 2022-01-15: qty 4, 28d supply, fill #0
  Filled 2022-02-18: qty 4, 28d supply, fill #1
  Filled 2022-03-22: qty 4, 28d supply, fill #2

## 2022-01-15 NOTE — Progress Notes (Signed)
Virtual Visit via Telephone Note ? I discussed the limitations, risks, security and privacy concerns of performing an evaluation and management service by telephone and the availability of in person appointments. I also discussed with the patient that there may be a patient responsible charge related to this service. The patient expressed understanding and agreed to proceed.  ? ? ?I connected with Renee Porter on 01/18/22  at   3:50 PM EDT  EDT by telephone and verified that I am speaking with the correct person using two identifiers. ? ?Location of Patient: ?Private Residence ?  ?Location of Provider: ?Scientist, research (physical sciences) and [employer] Office  ?  ?Persons participating in Telemedicine visit: ?Geryl Rankins FNP-BC ?Renee Porter  ?Spanish Interpreter JENNIE [phone] ?  ?History of Present Illness: ?Telemedicine visit for: HTN ?Blood pressure continues markedly elevated.  I am adding clonidine patch weekly to her current regimen of labetalol 600 mg twice daily, losartan 50 mg daily.  She has chronic kidney disease and therefore antihypertensives are limited. Home blood pressure reading today was 144/132 ?BP Readings from Last 3 Encounters:  ?01/14/22 (!) 161/103  ?08/13/21 (!) 141/85  ?06/25/21 (!) 172/106  ?  ? ? ? ?Past Medical History:  ?Diagnosis Date  ? Hypertension   ? Prediabetes   ?  ?Past Surgical History:  ?Procedure Laterality Date  ? CESAREAN SECTION    ? FOOT SURGERY    ?  ?Family History  ?Problem Relation Age of Onset  ? Diabetes Father   ?  ?Social History  ? ?Socioeconomic History  ? Marital status: Single  ?  Spouse name: Not on file  ? Number of children: Not on file  ? Years of education: Not on file  ? Highest education level: Not on file  ?Occupational History  ? Not on file  ?Tobacco Use  ? Smoking status: Never  ? Smokeless tobacco: Never  ?Vaping Use  ? Vaping Use: Never used  ?Substance and Sexual Activity  ? Alcohol use: Never  ? Drug use: Never  ? Sexual activity: Not  Currently  ?  Birth control/protection: None  ?Other Topics Concern  ? Not on file  ?Social History Narrative  ? Not on file  ? ?Social Determinants of Health  ? ?Financial Resource Strain: Not on file  ?Food Insecurity: Not on file  ?Transportation Needs: Not on file  ?Physical Activity: Not on file  ?Stress: Not on file  ?Social Connections: Not on file  ?  ? ?Observations/Objective: ?Awake, alert and oriented x 3 ? ? ?Review of Systems  ?Constitutional:  Negative for fever, malaise/fatigue and weight loss.  ?HENT: Negative.  Negative for nosebleeds.   ?Eyes: Negative.  Negative for blurred vision, double vision and photophobia.  ?Respiratory: Negative.  Negative for cough and shortness of breath.   ?Cardiovascular: Negative.  Negative for chest pain, palpitations and leg swelling.  ?Gastrointestinal: Negative.  Negative for heartburn, nausea and vomiting.  ?Musculoskeletal: Negative.  Negative for myalgias.  ?Neurological: Negative.  Negative for dizziness, focal weakness, seizures and headaches.  ?Psychiatric/Behavioral: Negative.  Negative for suicidal ideas.    ?Assessment and Plan: ?Diagnoses and all orders for this visit: ? ?Primary hypertension ?Clonidine patch 0.1 mg weekly ?Bring blood pressure log to each office visit.  ?DASH DIET ?  ? ?Follow Up Instructions ?Return in about 3 months (around 04/16/2022).  ? ?  ?I discussed the assessment and treatment plan with the patient. The patient was provided an opportunity to ask questions and all were answered. The  patient agreed with the plan and demonstrated an understanding of the instructions. ?  ?The patient was advised to call back or seek an in-person evaluation if the symptoms worsen or if the condition fails to improve as anticipated. ? ?I provided 12 minutes of non-face-to-face time during this encounter including median intraservice time, reviewing previous notes, labs, imaging, medications and explaining diagnosis and management. ? ?[name],  FNP-BC  ?

## 2022-01-15 NOTE — Progress Notes (Unsigned)
Virtual Visit via Telephone Note ?Due to national recommendations of social distancing due to [place] 19, telehealth visit is felt to be most appropriate for this patient at this time.  I discussed the limitations, risks, security and privacy concerns of performing an evaluation and management service by telephone and the availability of in person appointments. I also discussed with the patient that there may be a patient responsible charge related to this service. The patient expressed understanding and agreed to proceed.  ? ? ?I connected with Renee Porter on 01/15/22  at    EDT by telephone and verified that I am speaking with the correct person using two identifiers. ? ?Location of Patient: ?Private Residence ?  ?Location of Provider: ?Scientist, research (physical sciences) and [employer] Office  ?  ?Persons participating in Telemedicine visit: ?Geryl Rankins FNP-BC ?Renee Porter  ?  ?History of Present Illness: ?Telemedicine visit for: *** ? ? ? ?Past Medical History:  ?Diagnosis Date  ? Hypertension   ? Prediabetes   ?  ?Past Surgical History:  ?Procedure Laterality Date  ? CESAREAN SECTION    ? FOOT SURGERY    ?  ?Family History  ?Problem Relation Age of Onset  ? Diabetes Father   ?  ?Social History  ? ?Socioeconomic History  ? Marital status: Single  ?  Spouse name: Not on file  ? Number of children: Not on file  ? Years of education: Not on file  ? Highest education level: Not on file  ?Occupational History  ? Not on file  ?Tobacco Use  ? Smoking status: Never  ? Smokeless tobacco: Never  ?Vaping Use  ? Vaping Use: Never used  ?Substance and Sexual Activity  ? Alcohol use: Never  ? Drug use: Never  ? Sexual activity: Not Currently  ?  Birth control/protection: None  ?Other Topics Concern  ? Not on file  ?Social History Narrative  ? Not on file  ? ?Social Determinants of Health  ? ?Financial Resource Strain: Not on file  ?Food Insecurity: Not on file  ?Transportation Needs: Not on file  ?Physical Activity: Not on  file  ?Stress: Not on file  ?Social Connections: Not on file  ?  ? ?Observations/Objective: ?Awake, alert and oriented x 3 ? ? ?ROS  ?Assessment and Plan: ?There are no diagnoses linked to this encounter.  ? ?Follow Up Instructions ?No follow-ups on file.  ? ?  ?I discussed the assessment and treatment plan with the patient. The patient was provided an opportunity to ask questions and all were answered. The patient agreed with the plan and demonstrated an understanding of the instructions. ?  ?The patient was advised to call back or seek an in-person evaluation if the symptoms worsen or if the condition fails to improve as anticipated. ? ?I provided *** minutes of non-face-to-face time during this encounter including median intraservice time, reviewing previous notes, labs, imaging, medications and explaining diagnosis and management. ? ?[name], FNP-BC  ?

## 2022-01-16 ENCOUNTER — Telehealth: Payer: Self-pay | Admitting: *Deleted

## 2022-01-16 ENCOUNTER — Other Ambulatory Visit: Payer: Self-pay

## 2022-01-16 NOTE — Telephone Encounter (Signed)
Fyi.

## 2022-01-16 NOTE — Telephone Encounter (Signed)
Copied from [place] [id number]. Topic: General - Inquiry ?>> Jan 14, 2022  3:20 PM Scherrie Gerlach wrote: ?Reason for CRM: ashley w/ [facility] said last time they saw pt was 09/27/2021 and her bp was 140/80.  She has upcoming end of this month, but that is all they got! ?

## 2022-01-18 ENCOUNTER — Encounter: Payer: Self-pay | Admitting: Nurse Practitioner

## 2022-02-08 NOTE — Progress Notes (Unsigned)
? ?  S:    ?Renee Porter is a 36 y.o. female who presents for hypertension evaluation, education, and management. PMH is significant for HTN, CKD, T2DM, HLD. Patient was referred and last seen by Primary Care Provider, Geryl Rankins, NP, on 01/15/22. BP was 161/103 and clonidine patch was added.  ? ?Today, patient arrives in *** spirits and presents {w-w/o:315700} assistance. *** Denies dizziness, headache, blurred vision, swelling.  ?***bring BP cuff? Had nephro appt 4/26? Amlodipine? ? ?Patient reports hypertension was diagnosed in ***.  ? ?Family/Social history:  ?-Never smoker ? ?Medication adherence *** . Patient has *** taken BP medications today.  ? ?Current antihypertensives include: losartan 50 mg daily, labetalol 600 mg BID, clonidine 0.1 mg patch ? ?Antihypertensives tried in the past include: *** ? ?Reported home BP readings: *** ? ?Patient reported dietary habits: Eats *** meals/day ?Breakfast: *** ?Lunch: *** ?Dinner: *** ?Snacks: *** ?Drinks: *** ? ?Patient-reported exercise habits: *** ? ?ASCVD risk factors include: HTN, CKD ? ?O:  ? ?Last 3 Office BP readings: ?BP Readings from Last 3 Encounters:  ?01/14/22 (!) 161/103  ?08/13/21 (!) 141/85  ?06/25/21 (!) 172/106  ? ? ?BMET ?   ?Component Value Date/Time  ? NA 139 08/13/2021 1025  ? K 5.6 (H) 08/13/2021 1025  ? CL 111 (H) 08/13/2021 1025  ? CO2 17 (L) 08/13/2021 1025  ? GLUCOSE 132 (H) 08/13/2021 1025  ? GLUCOSE 106 (H) 07/05/2019 0529  ? BUN 37 (H) 08/13/2021 1025  ? CREATININE 2.58 (H) 08/13/2021 1025  ? CALCIUM 9.5 08/13/2021 1025  ? GFRNONAA 31 (L) 08/28/2020 1117  ? GFRAA 36 (L) 08/28/2020 1117  ? ? ?Renal function: ?CrCl cannot be calculated (Patient's most recent lab result is older than the maximum 21 days allowed.). ? ?Clinical ASCVD: No  ?The ASCVD Risk score (Arnett DK, et al., 2019) failed to calculate for the following reasons: ?  The 2019 ASCVD risk score is only valid for ages 71 to 42 ? ?A/P: ?Hypertension diagnosed *** currently  *** on current medications. BP goal < 130/80  mmHg. Medication adherence appears ***. Control is suboptimal due to ***.  ?-{Meds adjust:18428} ***.  ?-Patient educated on purpose, proper use, and potential adverse effects of ***.  ?-F/u labs ordered - *** ?-Counseled on lifestyle modifications for blood pressure control including reduced dietary sodium, increased exercise, adequate sleep. ?-Encouraged patient to check BP at home and bring log of readings to next visit. Counseled on proper use of home BP cuff.  ? ?Results reviewed and written information provided. Patient verbalized understanding of treatment plan. Total time in face-to-face counseling *** minutes.  ? ?F/u clinic visit in ***.  ? ?

## 2022-02-11 ENCOUNTER — Ambulatory Visit: Payer: No Typology Code available for payment source | Admitting: Pharmacist

## 2022-02-18 ENCOUNTER — Other Ambulatory Visit: Payer: Self-pay

## 2022-02-19 ENCOUNTER — Other Ambulatory Visit: Payer: Self-pay

## 2022-02-20 ENCOUNTER — Other Ambulatory Visit: Payer: Self-pay

## 2022-03-15 NOTE — Progress Notes (Unsigned)
   S:    Renee Porter is a 36 y.o. female who presents for hypertension evaluation, education, and management. PMH is significant for HTN, CKD, T2DM, HLD. Patient was referred and last seen by Primary Care Provider, Geryl Rankins, NP, on 01/15/22. BP was 161/103 and clonidine patch was added.   Today, patient arrives in *** spirits and presents {w-w/o:315700} assistance. *** Denies dizziness, headache, blurred vision, swelling.  ***bring BP cuff? Had nephro appt 4/26? Egfr 25 in 08/2021. Amlodipine? Has previously been on  Patient reports hypertension was diagnosed in ***.   Family/Social history:  -Never smoker  Medication adherence *** . Patient has *** taken BP medications today.   Current antihypertensives include: losartan 50 mg daily, labetalol 600 mg BID, clonidine 0.1 mg patch  Antihypertensives tried in the past include: ***  Reported home BP readings: ***  Patient reported dietary habits: Eats *** meals/day Breakfast: *** Lunch: *** Dinner: *** Snacks: *** Drinks: ***  Patient-reported exercise habits: ***  ASCVD risk factors include: HTN, CKD  O:   Last 3 Office BP readings: BP Readings from Last 3 Encounters:  01/14/22 (!) 161/103  08/13/21 (!) 141/85  06/25/21 (!) 172/106    BMET    Component Value Date/Time   NA 139 08/13/2021 1025   K 5.6 (H) 08/13/2021 1025   CL 111 (H) 08/13/2021 1025   CO2 17 (L) 08/13/2021 1025   GLUCOSE 132 (H) 08/13/2021 1025   GLUCOSE 106 (H) 07/05/2019 0529   BUN 37 (H) 08/13/2021 1025   CREATININE 2.58 (H) 08/13/2021 1025   CALCIUM 9.5 08/13/2021 1025   GFRNONAA 31 (L) 08/28/2020 1117   GFRAA 36 (L) 08/28/2020 1117    Renal function: CrCl cannot be calculated (Patient's most recent lab result is older than the maximum 21 days allowed.).  Clinical ASCVD: No  The ASCVD Risk score (Arnett DK, et al., 2019) failed to calculate for the following reasons:   The 2019 ASCVD risk score is only valid for ages 23 to  50  A/P: Hypertension diagnosed *** currently *** on current medications. BP goal < 130/80  mmHg. Medication adherence appears ***. Control is suboptimal due to ***.  -{Meds adjust:18428} ***.  -Patient educated on purpose, proper use, and potential adverse effects of ***.  -F/u labs ordered - *** -Counseled on lifestyle modifications for blood pressure control including reduced dietary sodium, increased exercise, adequate sleep. -Encouraged patient to check BP at home and bring log of readings to next visit. Counseled on proper use of home BP cuff.   Results reviewed and written information provided. Patient verbalized understanding of treatment plan. Total time in face-to-face counseling *** minutes.   F/u clinic visit in ***.

## 2022-03-18 ENCOUNTER — Telehealth: Payer: Self-pay | Admitting: Nurse Practitioner

## 2022-03-18 ENCOUNTER — Ambulatory Visit: Payer: No Typology Code available for payment source | Admitting: Pharmacist

## 2022-03-18 NOTE — Telephone Encounter (Signed)
Pt is calling to reschedule appt with [facility]. Please advise [phone]

## 2022-03-19 NOTE — Telephone Encounter (Signed)
Called pt and appt was made Interpreter [phone]

## 2022-03-22 ENCOUNTER — Other Ambulatory Visit: Payer: Self-pay

## 2022-03-25 ENCOUNTER — Other Ambulatory Visit: Payer: Self-pay

## 2022-04-15 ENCOUNTER — Encounter: Payer: Self-pay | Admitting: Nurse Practitioner

## 2022-04-15 ENCOUNTER — Ambulatory Visit: Payer: No Typology Code available for payment source | Attending: Nurse Practitioner | Admitting: Nurse Practitioner

## 2022-04-15 ENCOUNTER — Other Ambulatory Visit: Payer: Self-pay

## 2022-04-15 VITALS — BP 169/111 | HR 69 | Wt 185.6 lb

## 2022-04-15 DIAGNOSIS — I10 Essential (primary) hypertension: Secondary | ICD-10-CM

## 2022-04-15 DIAGNOSIS — E1165 Type 2 diabetes mellitus with hyperglycemia: Secondary | ICD-10-CM

## 2022-04-15 MED ORDER — CLONIDINE 0.2 MG/24HR TD PTWK
0.2000 mg | MEDICATED_PATCH | TRANSDERMAL | 0 refills | Status: DC
  Filled 2022-04-15: qty 12, 84d supply, fill #0

## 2022-04-15 MED ORDER — LOSARTAN POTASSIUM 50 MG PO TABS
50.0000 mg | ORAL_TABLET | Freq: Every day | ORAL | 1 refills | Status: DC
  Filled 2022-04-15 – 2022-06-24 (×3): qty 90, 90d supply, fill #0
  Filled 2022-10-03 – 2022-10-04 (×2): qty 90, 90d supply, fill #1

## 2022-04-15 NOTE — Progress Notes (Signed)
Assessment & Plan:  Renee Porter was seen today for hypertension.  Diagnoses and all orders for this visit:  Primary hypertension -     cloNIDine (CATAPRES - DOSED IN MG/24 HR) 0.2 mg/24hr patch; Place 1 patch (0.2 mg total) onto the skin once a week. -     losartan (COZAAR) 50 MG tablet; Take 1 tablet (50 mg total) by mouth daily. -     CMP14+EGFR  Type 2 diabetes mellitus with hyperglycemia, without long-term current use of insulin ([facility]) -     Hemoglobin A1c    Patient has been counseled on age-appropriate routine health concerns for screening and prevention. These are reviewed and up-to-date. Referrals have been placed accordingly. Immunizations are up-to-date or declined.    Subjective:   Chief Complaint  Patient presents with   Hypertension    Renee Porter 36 y.o. female presents to office today for follow up to HTN  She has a past medical history of CKD stage 4 secondary to hypertension, Hypertension, and DM 2 well controlled  She has her blood pressure device with her today. She has not been using it because she did not know how. She was able to return demonstrate with me today how to use it herself.  Blood pressure is elevated today. She can not recall how high or low her blood pressure readings are at the nephrologist office.     Blood pressure is elevated today. Will increase clonidine to 0.2 mg weekly at this time. She will continue on labetalol 300 mg BID and losartan 50 mg daily.  BP Readings from Last 3 Encounters:  04/15/22 (!) 169/111  01/14/22 (!) 161/103  08/13/21 (!) 141/85    Review of Systems  Constitutional:  Negative for fever, malaise/fatigue and weight loss.  HENT: Negative.  Negative for nosebleeds.   Eyes: Negative.  Negative for blurred vision, double vision and photophobia.  Respiratory: Negative.  Negative for cough and shortness of breath.   Cardiovascular: Negative.  Negative for chest pain, palpitations and leg swelling.   Gastrointestinal: Negative.  Negative for heartburn, nausea and vomiting.  Musculoskeletal: Negative.  Negative for myalgias.  Neurological: Negative.  Negative for dizziness, focal weakness, seizures and headaches.  Psychiatric/Behavioral: Negative.  Negative for suicidal ideas.     Past Medical History:  Diagnosis Date   CKD stage 4 secondary to hypertension ([place])    Hypertension    Prediabetes     Past Surgical History:  Procedure Laterality Date   CESAREAN SECTION     FOOT SURGERY      Family History  Problem Relation Age of Onset   Diabetes Father     Social History Reviewed with no changes to be made today.   Outpatient Medications Prior to Visit  Medication Sig Dispense Refill   acetaminophen (TYLENOL) 325 MG tablet Take 325 mg by mouth every 6 (six) hours as needed for moderate pain or headache.     atorvastatin (LIPITOR) 20 MG tablet Take 1 tablet (20 mg total) by mouth daily. 90 tablet 3   labetalol (NORMODYNE) 300 MG tablet TAKE 2 TABLETS (600 MG TOTAL) BY MOUTH 2 (TWO) TIMES DAILY. 360 tablet 1   Prenatal Vit-Fe Fumarate-FA (PRENATAL VITAMINS PO) Take 1 tablet by mouth daily in the afternoon.     cloNIDine (CATAPRES - DOSED IN MG/24 HR) 0.1 mg/24hr patch Place 1 patch (0.1 mg total) onto the skin once a week. 4 patch 12   losartan (COZAAR) 50 MG tablet Take 1 tablet (50  mg total) by mouth daily. 90 tablet 1   No facility-administered medications prior to visit.    No Known Allergies     Objective:    BP (!) 169/111   Pulse 69   Wt 185 lb 9.6 oz (84.2 kg)   SpO2 100%   BMI 40.16 kg/m  Wt Readings from Last 3 Encounters:  04/15/22 185 lb 9.6 oz (84.2 kg)  01/14/22 187 lb 3.2 oz (84.9 kg)  08/13/21 186 lb 2 oz (84.4 kg)    Physical Exam Vitals and nursing note reviewed.  Constitutional:      Appearance: She is well-developed.  HENT:     Head: Normocephalic and atraumatic.  Cardiovascular:     Rate and Rhythm: Normal rate and regular rhythm.      Heart sounds: Normal heart sounds. No murmur heard.    No friction rub. No gallop.  Pulmonary:     Effort: Pulmonary effort is normal. No tachypnea or respiratory distress.     Breath sounds: Normal breath sounds. No decreased breath sounds, wheezing, rhonchi or rales.  Chest:     Chest wall: No tenderness.  Abdominal:     General: Bowel sounds are normal.     Palpations: Abdomen is soft.  Musculoskeletal:        General: Normal range of motion.     Cervical back: Normal range of motion.  Skin:    General: Skin is warm and dry.  Neurological:     Mental Status: She is alert and oriented to person, place, and time.     Coordination: Coordination normal.  Psychiatric:        Behavior: Behavior normal. Behavior is cooperative.        Thought Content: Thought content normal.        Judgment: Judgment normal.          Patient has been counseled extensively about nutrition and exercise as well as the importance of adherence with medications and regular follow-up. The patient was given clear instructions to go to ER or return to medical center if symptoms don't improve, worsen or new problems develop. The patient verbalized understanding.   Follow-up: Return for 2-3 weeks f/u with luke for BP CHECK. See me in 3 months.   [name], FNP-BC [facility] and [place] [place], [place]   04/15/2022, 2:32 PM

## 2022-04-16 ENCOUNTER — Other Ambulatory Visit: Payer: Self-pay

## 2022-04-16 LAB — CMP14+EGFR
ALT: 29 IU/L (ref 0–32)
AST: 19 IU/L (ref 0–40)
Albumin/Globulin Ratio: 1.4 (ref 1.2–2.2)
Albumin: 4.2 g/dL (ref 3.9–4.9)
Alkaline Phosphatase: 164 IU/L — ABNORMAL HIGH (ref 44–121)
BUN/Creatinine Ratio: 15 (ref 9–23)
BUN: 36 mg/dL — ABNORMAL HIGH (ref 6–20)
Bilirubin Total: 0.3 mg/dL (ref 0.0–1.2)
CO2: 19 mmol/L — ABNORMAL LOW (ref 20–29)
Calcium: 9.5 mg/dL (ref 8.7–10.2)
Chloride: 106 mmol/L (ref 96–106)
Creatinine, Ser: 2.45 mg/dL — ABNORMAL HIGH (ref 0.57–1.00)
Globulin, Total: 3.1 g/dL (ref 1.5–4.5)
Glucose: 207 mg/dL — ABNORMAL HIGH (ref 70–99)
Potassium: 5.3 mmol/L — ABNORMAL HIGH (ref 3.5–5.2)
Sodium: 138 mmol/L (ref 134–144)
Total Protein: 7.3 g/dL (ref 6.0–8.5)
eGFR: 26 mL/min/{1.73_m2} — ABNORMAL LOW (ref >59)

## 2022-04-16 LAB — HEMOGLOBIN A1C
Est. average glucose Bld gHb Est-mCnc: 183 mg/dL
Hgb A1c MFr Bld: 8 % — ABNORMAL HIGH (ref 4.8–5.6)

## 2022-04-17 ENCOUNTER — Other Ambulatory Visit: Payer: Self-pay

## 2022-04-17 ENCOUNTER — Other Ambulatory Visit: Payer: Self-pay | Admitting: Nurse Practitioner

## 2022-04-17 DIAGNOSIS — E118 Type 2 diabetes mellitus with unspecified complications: Secondary | ICD-10-CM

## 2022-04-17 MED ORDER — GLIPIZIDE ER 5 MG PO TB24
5.0000 mg | ORAL_TABLET | Freq: Every day | ORAL | 1 refills | Status: DC
  Filled 2022-04-17: qty 30, 30d supply, fill #0
  Filled 2022-05-27: qty 30, 30d supply, fill #1

## 2022-04-29 ENCOUNTER — Ambulatory Visit: Payer: No Typology Code available for payment source | Admitting: Pharmacist

## 2022-05-27 ENCOUNTER — Other Ambulatory Visit: Payer: Self-pay

## 2022-06-24 ENCOUNTER — Other Ambulatory Visit: Payer: Self-pay | Admitting: Nurse Practitioner

## 2022-06-24 ENCOUNTER — Other Ambulatory Visit: Payer: Self-pay

## 2022-06-24 DIAGNOSIS — E118 Type 2 diabetes mellitus with unspecified complications: Secondary | ICD-10-CM

## 2022-06-25 ENCOUNTER — Other Ambulatory Visit: Payer: Self-pay

## 2022-06-25 MED ORDER — GLIPIZIDE ER 5 MG PO TB24
5.0000 mg | ORAL_TABLET | Freq: Every day | ORAL | 1 refills | Status: DC
  Filled 2022-06-25: qty 90, 90d supply, fill #0

## 2022-06-25 NOTE — Telephone Encounter (Signed)
Requested Prescriptions  Pending Prescriptions Disp Refills  . glipiZIDE (GLUCOTROL XL) 5 MG 24 hr tablet 90 tablet 1    Sig: Take 1 tablet (5 mg total) by mouth daily with breakfast.     Endocrinology:  Diabetes - Sulfonylureas Failed - 06/24/2022  3:46 PM      Failed - HBA1C is between 0 and 7.9 and within 180 days    Hgb A1c MFr Bld  Date Value Ref Range Status  04/15/2022 8.0 (H) 4.8 - 5.6 % Final    Comment:             Prediabetes: 5.7 - 6.4          Diabetes: >6.4          Glycemic control for adults with diabetes: <7.0          Failed - Cr in normal range and within 360 days    Creatinine, Ser  Date Value Ref Range Status  04/15/2022 2.45 (H) 0.57 - 1.00 mg/dL Final   Creatinine, Urine  Date Value Ref Range Status  06/28/2019 22.76 mg/dL Final         Passed - Valid encounter within last 6 months    Recent Outpatient Visits          2 months ago Primary hypertension   [place], Vernia Buff, NP   5 months ago Primary hypertension   [place], Vernia Buff, NP   5 months ago Primary hypertension   [place], [place] W, NP   10 months ago Type 2 diabetes mellitus with hyperglycemia, without long-term current use of insulin [facility])   [place], Vernia Buff, NP   1 year ago Urine protein increased   [place], Jarome Matin, RPH-CPP      Future Appointments            In 3 weeks [name], NP [place]

## 2022-07-22 ENCOUNTER — Encounter: Payer: Self-pay | Admitting: Nurse Practitioner

## 2022-07-22 ENCOUNTER — Ambulatory Visit: Payer: No Typology Code available for payment source | Attending: Nurse Practitioner | Admitting: Nurse Practitioner

## 2022-07-22 ENCOUNTER — Other Ambulatory Visit: Payer: Self-pay

## 2022-07-22 VITALS — BP 148/95 | HR 73 | Temp 97.9°F | Ht <= 58 in | Wt 171.0 lb

## 2022-07-22 DIAGNOSIS — I10 Essential (primary) hypertension: Secondary | ICD-10-CM

## 2022-07-22 DIAGNOSIS — E785 Hyperlipidemia, unspecified: Secondary | ICD-10-CM

## 2022-07-22 DIAGNOSIS — E118 Type 2 diabetes mellitus with unspecified complications: Secondary | ICD-10-CM

## 2022-07-22 LAB — POCT GLYCOSYLATED HEMOGLOBIN (HGB A1C): HbA1c POC (<> result, manual entry): >15 % (ref 4.0–5.6)

## 2022-07-22 LAB — GLUCOSE, POCT (MANUAL RESULT ENTRY): POC Glucose: 423 mg/dl — AB (ref 70–99)

## 2022-07-22 MED ORDER — GLIPIZIDE ER 10 MG PO TB24
10.0000 mg | ORAL_TABLET | Freq: Every day | ORAL | 1 refills | Status: DC
  Filled 2022-07-22: qty 30, 30d supply, fill #0
  Filled 2022-08-23: qty 30, 30d supply, fill #1
  Filled 2022-10-03 – 2022-10-04 (×2): qty 30, 30d supply, fill #2
  Filled 2022-11-10: qty 30, 30d supply, fill #3
  Filled 2022-12-09 (×2): qty 30, 30d supply, fill #4
  Filled 2023-01-01: qty 30, 30d supply, fill #5

## 2022-07-22 MED ORDER — INSULIN ASPART 100 UNIT/ML IJ SOLN
10.0000 [IU] | Freq: Once | INTRAMUSCULAR | Status: AC
  Administered 2022-07-22: 10 [IU] via SUBCUTANEOUS

## 2022-07-22 MED ORDER — CLONIDINE 0.3 MG/24HR TD PTWK
0.3000 mg | MEDICATED_PATCH | TRANSDERMAL | 1 refills | Status: DC
  Filled 2022-07-22: qty 12, 84d supply, fill #0

## 2022-07-22 MED ORDER — TRUEPLUS LANCETS 28G MISC
3 refills | Status: DC
  Filled 2022-07-22: qty 100, 33d supply, fill #0

## 2022-07-22 MED ORDER — LANTUS SOLOSTAR 100 UNIT/ML ~~LOC~~ SOPN
10.0000 [IU] | PEN_INJECTOR | Freq: Every day | SUBCUTANEOUS | 99 refills | Status: DC
  Filled 2022-07-22: qty 15, 150d supply, fill #0

## 2022-07-22 MED ORDER — TRUE METRIX METER W/DEVICE KIT
PACK | 0 refills | Status: DC
  Filled 2022-07-22: qty 1, 33d supply, fill #0

## 2022-07-22 MED ORDER — LABETALOL HCL 300 MG PO TABS
600.0000 mg | ORAL_TABLET | Freq: Two times a day (BID) | ORAL | 1 refills | Status: DC
  Filled 2022-07-22: qty 120, 30d supply, fill #0
  Filled 2022-08-23: qty 120, 30d supply, fill #1
  Filled 2022-10-03 – 2022-10-04 (×2): qty 120, 30d supply, fill #2
  Filled 2022-11-10: qty 120, 30d supply, fill #3
  Filled 2023-01-01: qty 120, 30d supply, fill #4
  Filled 2023-02-01: qty 120, 30d supply, fill #5

## 2022-07-22 MED ORDER — TRUE METRIX BLOOD GLUCOSE TEST VI STRP
ORAL_STRIP | 12 refills | Status: DC
  Filled 2022-07-22: qty 100, 33d supply, fill #0

## 2022-07-22 MED ORDER — BASAGLAR KWIKPEN 100 UNIT/ML ~~LOC~~ SOPN
10.0000 [IU] | PEN_INJECTOR | Freq: Every day | SUBCUTANEOUS | 1 refills | Status: DC
  Filled 2022-07-22: qty 3, 28d supply, fill #0

## 2022-07-22 NOTE — Progress Notes (Signed)
Assessment & Plan:  Renee Porter was seen today for diabetes and hypertension.  Diagnoses and all orders for this visit:  Primary hypertension Added clonidine patch 0.3 mg weekly-discontinue 0.2 mg weekly patch -     labetalol (NORMODYNE) 300 MG tablet; Take 2 tablets (600 mg total) by mouth 2 (two) times daily.   Controlled type 2 diabetes mellitus with complication, without long-term current use of insulin (HCC) Goal meter readings were placed on her after visit summary today -     POCT glycosylated hemoglobin (Hb A1C) -     POCT glucose (manual entry) -     Microalbumin / creatinine urine ratio -     Blood Glucose Monitoring Suppl (TRUE METRIX METER) w/Device KIT; Use as instructed. Check blood glucose level by fingerstick three times per day. -     TRUEplus Lancets 28G MISC; Use as instructed. Check blood glucose level by fingerstick three times per day. -     glucose blood (TRUE METRIX BLOOD GLUCOSE TEST) test strip; Use as instructed. Check blood glucose level by fingerstick three times per day. -     glipiZIDE (GLUCOTROL XL) 10 MG 24 hr tablet; Take 1 tablet (10 mg total) by mouth daily with breakfast.. Added-     Insulin Glargine (BASAGLAR KWIKPEN) 100 UNIT/ML; Inject 10 Units into the skin daily. -     insulin aspart (novoLOG) injection 10 Units  Dyslipidemia, goal LDL below 70 -     Lipid panel INSTRUCTIONS: Work on a low fat, heart healthy diet and participate in regular aerobic exercise program by working out at least 150 minutes per week; 5 days a week-30 minutes per day. Avoid red meat/beef/steak,  fried foods. junk foods, sodas, sugary drinks, unhealthy snacking, alcohol and smoking.  Drink at least 80 oz of water per day and monitor your carbohydrate intake daily.      Patient has been counseled on age-appropriate routine health concerns for screening and prevention. These are reviewed and up-to-date. Referrals have been placed accordingly. Immunizations are up-to-date or  declined.    Subjective:   Chief Complaint  Patient presents with   Diabetes   Hypertension   HPI Renee Porter 36 y.o. female presents to office today for follow-up to hypertension and diabetes.  VRI was used to communicate directly with patient for the entire encounter including providing detailed patient instructions.     HTN Blood pressure is elevated. She has CKD stage 4 and is currently being followed by Nephrology. She is taking labetalol  600 mg BID,, clonidine patch 0.2 mg weekly and losartan 50 mg daily. BP Readings from Last 3 Encounters:  07/22/22 (!) 148/95  04/15/22 (!) 169/111  01/14/22 (!) 161/103     DM 2 Poorly controlled.  Glucose is high today in office greater than 400 and she required 10 units of insulin.  She does not check her blood sugars at home.  Hyperglycemic symptoms include blurred vision.  A1c is up from 8 to >15 today.  We will start her on Basaglar 10 units daily and she will continue on glipizide 10 mg daily.  I have instructed her to not skip meals and to monitor her blood sugar readings fasting and postprandial.  She will need to bring in a food diary and her meter for next visit.  For breakfast she had coffee with sugar and cream and oatmeal with raisins which I have instructed her it likely is to carb heavy and to cut the raisins out of the  oatmeal, switch sugar to artificial sweetener and low-fat no sugar creamer alternative Although her weight is down I believe her lack of eating is likely contributing to higher readings  Lab Results  Component Value Date   HGBA1C 8.0 (H) 04/15/2022    Lab Results  Component Value Date   HGBA1C >15 07/22/2022    Lab Results  Component Value Date   LDLCALC 142 (H) 01/14/2022    Review of Systems  Constitutional:  Negative for fever, malaise/fatigue and weight loss.  HENT: Negative.  Negative for nosebleeds.   Eyes: Negative.  Negative for blurred vision, double vision and photophobia.  Respiratory:  Negative.  Negative for cough and shortness of breath.   Cardiovascular: Negative.  Negative for chest pain, palpitations and leg swelling.  Gastrointestinal: Negative.  Negative for heartburn, nausea and vomiting.  Musculoskeletal: Negative.  Negative for myalgias.  Neurological: Negative.  Negative for dizziness, focal weakness, seizures and headaches.  Psychiatric/Behavioral: Negative.  Negative for suicidal ideas.     Past Medical History:  Diagnosis Date   CKD stage 4 secondary to hypertension ([place])    Hypertension    Prediabetes     Past Surgical History:  Procedure Laterality Date   CESAREAN SECTION     FOOT SURGERY      Family History  Problem Relation Age of Onset   Diabetes Father     Social History Reviewed with no changes to be made today.   Outpatient Medications Prior to Visit  Medication Sig Dispense Refill   acetaminophen (TYLENOL) 325 MG tablet Take 325 mg by mouth every 6 (six) hours as needed for moderate pain or headache.     atorvastatin (LIPITOR) 20 MG tablet Take 1 tablet (20 mg total) by mouth daily. 90 tablet 3   losartan (COZAAR) 50 MG tablet Take 1 tablet (50 mg total) by mouth daily. 90 tablet 1   Prenatal Vit-Fe Fumarate-FA (PRENATAL VITAMINS PO) Take 1 tablet by mouth daily in the afternoon.     cloNIDine (CATAPRES - DOSED IN MG/24 HR) 0.2 mg/24hr patch Place 0.2 mg onto the skin once a week.     glipiZIDE (GLUCOTROL XL) 5 MG 24 hr tablet Take 1 tablet (5 mg total) by mouth daily with breakfast. 90 tablet 1   labetalol (NORMODYNE) 300 MG tablet TAKE 2 TABLETS (600 MG TOTAL) BY MOUTH 2 (TWO) TIMES DAILY. 360 tablet 1   No facility-administered medications prior to visit.    No Known Allergies     Objective:    BP (!) 148/95   Pulse 73   Temp 97.9 F (36.6 C) (Temporal)   Ht _0  (1.448 m)   Wt 171 lb (77.6 kg)   SpO2 98%   BMI 37.00 kg/m  Wt Readings from Last 3 Encounters:  07/22/22 171 lb (77.6 kg)  04/15/22 185 lb 9.6 oz (84.2  kg)  01/14/22 187 lb 3.2 oz (84.9 kg)    Physical Exam Vitals and nursing note reviewed.  Constitutional:      Appearance: She is well-developed.  HENT:     Head: Normocephalic and atraumatic.  Cardiovascular:     Rate and Rhythm: Normal rate and regular rhythm.     Heart sounds: Normal heart sounds. No murmur heard.    No friction rub. No gallop.  Pulmonary:     Effort: Pulmonary effort is normal. No tachypnea or respiratory distress.     Breath sounds: Normal breath sounds. No decreased breath sounds, wheezing, rhonchi or rales.  Chest:     Chest wall: No tenderness.  Abdominal:     General: Bowel sounds are normal.     Palpations: Abdomen is soft.  Musculoskeletal:        General: Normal range of motion.     Cervical back: Normal range of motion.  Skin:    General: Skin is warm and dry.  Neurological:     Mental Status: She is alert and oriented to person, place, and time.     Coordination: Coordination normal.  Psychiatric:        Behavior: Behavior normal. Behavior is cooperative.        Thought Content: Thought content normal.        Judgment: Judgment normal.          Patient has been counseled extensively about nutrition and exercise as well as the importance of adherence with medications and regular follow-up. The patient was given clear instructions to go to ER or return to medical center if symptoms don't improve, worsen or new problems develop. The patient verbalized understanding.   Follow-up: Return for Diabetes Teaching/Meter check with [facility] in 3-4 weeks. See me in 3 months. [name], FNP-BC [facility] [place], [place]   07/22/2022, 2:29 PM

## 2022-07-22 NOTE — Patient Instructions (Signed)
Before you eat in the morning your blood sugars should be 90-130  1-2 hours after you eat  your blood sugar should be less than 180

## 2022-07-23 ENCOUNTER — Other Ambulatory Visit: Payer: Self-pay

## 2022-07-29 ENCOUNTER — Other Ambulatory Visit: Payer: Self-pay

## 2022-08-23 ENCOUNTER — Other Ambulatory Visit: Payer: Self-pay | Admitting: Nurse Practitioner

## 2022-08-23 DIAGNOSIS — I10 Essential (primary) hypertension: Secondary | ICD-10-CM

## 2022-08-26 ENCOUNTER — Other Ambulatory Visit: Payer: Self-pay

## 2022-08-26 MED ORDER — CLONIDINE 0.2 MG/24HR TD PTWK
0.2000 mg | MEDICATED_PATCH | TRANSDERMAL | 1 refills | Status: DC
  Filled 2022-08-26: qty 4, 28d supply, fill #0
  Filled 2022-10-03 – 2022-10-04 (×2): qty 4, 28d supply, fill #1
  Filled 2022-11-10: qty 4, 28d supply, fill #2
  Filled 2023-02-14: qty 4, 28d supply, fill #3

## 2022-08-26 NOTE — Telephone Encounter (Signed)
Requested Prescriptions  Pending Prescriptions Disp Refills   cloNIDine (CATAPRES - DOSED IN MG/24 HR) 0.2 mg/24hr patch 12 patch 1    Sig: Place 1 patch (0.2 mg total) onto the skin once a week.     Cardiovascular:  Alpha-2 Agonists Failed - 08/23/2022  5:08 PM      Failed - Last BP in normal range    BP Readings from Last 1 Encounters:  07/22/22 (!) 148/95         Passed - Last Heart Rate in normal range    Pulse Readings from Last 1 Encounters:  07/22/22 73         Passed - Valid encounter within last 6 months    Recent Outpatient Visits           1 month ago Primary hypertension   [place], Vernia Buff, NP   4 months ago Primary hypertension   [place], Vernia Buff, NP   7 months ago Primary hypertension   [place], Vernia Buff, NP   7 months ago Primary hypertension   [place], [place] W, NP   1 year ago Type 2 diabetes mellitus with hyperglycemia, without long-term current use of insulin [facility])   [place] [name], NP       Future Appointments             In 1 week [place], Jarome Matin, [place]   In 1 month [name], NP [place]

## 2022-08-27 ENCOUNTER — Other Ambulatory Visit: Payer: Self-pay

## 2022-09-02 ENCOUNTER — Ambulatory Visit: Payer: No Typology Code available for payment source | Admitting: Pharmacist

## 2022-09-02 ENCOUNTER — Other Ambulatory Visit: Payer: Self-pay

## 2022-10-04 ENCOUNTER — Other Ambulatory Visit: Payer: Self-pay

## 2022-10-09 ENCOUNTER — Other Ambulatory Visit: Payer: Self-pay

## 2022-10-21 ENCOUNTER — Ambulatory Visit: Payer: Self-pay | Admitting: Nurse Practitioner

## 2022-11-11 ENCOUNTER — Other Ambulatory Visit: Payer: Self-pay

## 2022-12-09 ENCOUNTER — Other Ambulatory Visit: Payer: Self-pay

## 2022-12-09 ENCOUNTER — Other Ambulatory Visit: Payer: Self-pay | Admitting: Nurse Practitioner

## 2022-12-09 ENCOUNTER — Ambulatory Visit: Payer: Self-pay | Admitting: Nurse Practitioner

## 2022-12-09 DIAGNOSIS — I10 Essential (primary) hypertension: Secondary | ICD-10-CM

## 2022-12-10 ENCOUNTER — Other Ambulatory Visit: Payer: Self-pay

## 2022-12-10 MED ORDER — LOSARTAN POTASSIUM 50 MG PO TABS
50.0000 mg | ORAL_TABLET | Freq: Every day | ORAL | 0 refills | Status: DC
  Filled 2022-12-10: qty 90, 90d supply, fill #0

## 2022-12-10 NOTE — Telephone Encounter (Signed)
Requested Prescriptions  Pending Prescriptions Disp Refills   losartan (COZAAR) 50 MG tablet 90 tablet 0    Sig: Take 1 tablet (50 mg total) by mouth daily.     Cardiovascular:  Angiotensin Receptor Blockers Failed - 12/09/2022  8:07 AM      Failed - Cr in normal range and within 180 days    Creatinine, Ser  Date Value Ref Range Status  04/15/2022 2.45 (H) 0.57 - 1.00 mg/dL Final   Creatinine, Urine  Date Value Ref Range Status  06/28/2019 22.76 mg/dL Final         Failed - K in normal range and within 180 days    Potassium  Date Value Ref Range Status  04/15/2022 5.3 (H) 3.5 - 5.2 mmol/L Final         Failed - Last BP in normal range    BP Readings from Last 1 Encounters:  07/22/22 (!) 148/95         Passed - Patient is not pregnant      Passed - Valid encounter within last 6 months    Recent Outpatient Visits           4 months ago Primary hypertension   [place], Vernia Buff, NP   7 months ago Primary hypertension   [place], Vernia Buff, NP   10 months ago Primary hypertension   [place], Vernia Buff, NP   11 months ago Primary hypertension   [place], [place] W, NP   1 year ago Type 2 diabetes mellitus with hyperglycemia, without long-term current use of insulin [facility])   [place], Vernia Buff, NP       Future Appointments             In 1 month [name], NP [place]

## 2022-12-12 ENCOUNTER — Other Ambulatory Visit: Payer: Self-pay

## 2022-12-26 LAB — LAB REPORT - SCANNED
Albumin, Urine POC: 211.2
Albumin/Creatinine Ratio, Urine, POC: 612
Creatinine, POC: 34.5 mg/dL
EGFR: 34
Protein/Creatinine Ratio: 1333

## 2023-01-01 ENCOUNTER — Other Ambulatory Visit: Payer: Self-pay

## 2023-01-01 ENCOUNTER — Other Ambulatory Visit: Payer: Self-pay | Admitting: Nurse Practitioner

## 2023-01-01 DIAGNOSIS — E785 Hyperlipidemia, unspecified: Secondary | ICD-10-CM

## 2023-01-01 MED ORDER — ATORVASTATIN CALCIUM 20 MG PO TABS
20.0000 mg | ORAL_TABLET | Freq: Every day | ORAL | 0 refills | Status: DC
  Filled 2023-01-01 – 2023-02-01 (×2): qty 30, 30d supply, fill #0

## 2023-01-02 ENCOUNTER — Other Ambulatory Visit: Payer: Self-pay

## 2023-01-06 ENCOUNTER — Other Ambulatory Visit: Payer: Self-pay

## 2023-01-13 ENCOUNTER — Ambulatory Visit: Payer: Self-pay | Admitting: Nurse Practitioner

## 2023-02-01 ENCOUNTER — Other Ambulatory Visit: Payer: Self-pay | Admitting: Nurse Practitioner

## 2023-02-01 ENCOUNTER — Other Ambulatory Visit: Payer: Self-pay

## 2023-02-01 DIAGNOSIS — I10 Essential (primary) hypertension: Secondary | ICD-10-CM

## 2023-02-01 DIAGNOSIS — E118 Type 2 diabetes mellitus with unspecified complications: Secondary | ICD-10-CM

## 2023-02-03 ENCOUNTER — Other Ambulatory Visit: Payer: Self-pay

## 2023-02-05 ENCOUNTER — Other Ambulatory Visit: Payer: Self-pay

## 2023-02-10 ENCOUNTER — Other Ambulatory Visit: Payer: Self-pay

## 2023-02-10 ENCOUNTER — Other Ambulatory Visit: Payer: Self-pay | Admitting: Nurse Practitioner

## 2023-02-10 DIAGNOSIS — E118 Type 2 diabetes mellitus with unspecified complications: Secondary | ICD-10-CM

## 2023-02-10 MED ORDER — GLIPIZIDE ER 10 MG PO TB24
10.0000 mg | ORAL_TABLET | Freq: Every day | ORAL | 0 refills | Status: DC
  Filled 2023-02-10 – 2023-02-17 (×2): qty 30, 30d supply, fill #0

## 2023-02-14 ENCOUNTER — Other Ambulatory Visit: Payer: Self-pay

## 2023-02-17 ENCOUNTER — Other Ambulatory Visit: Payer: Self-pay

## 2023-02-19 ENCOUNTER — Encounter: Payer: Self-pay | Admitting: Nurse Practitioner

## 2023-02-19 ENCOUNTER — Other Ambulatory Visit: Payer: Self-pay

## 2023-02-19 ENCOUNTER — Ambulatory Visit: Payer: Self-pay | Attending: Nurse Practitioner | Admitting: Nurse Practitioner

## 2023-02-19 ENCOUNTER — Other Ambulatory Visit (HOSPITAL_COMMUNITY)
Admission: RE | Admit: 2023-02-19 | Discharge: 2023-02-19 | Disposition: A | Discharge status: Home / Self Care | Payer: Self-pay | Source: Ambulatory Visit | Attending: Nurse Practitioner | Admitting: Nurse Practitioner

## 2023-02-19 VITALS — BP 170/107 | HR 87 | Ht <= 58 in | Wt 161.6 lb

## 2023-02-19 DIAGNOSIS — I10 Essential (primary) hypertension: Secondary | ICD-10-CM

## 2023-02-19 DIAGNOSIS — Z7984 Long term (current) use of oral hypoglycemic drugs: Secondary | ICD-10-CM

## 2023-02-19 DIAGNOSIS — E785 Hyperlipidemia, unspecified: Secondary | ICD-10-CM

## 2023-02-19 DIAGNOSIS — R309 Painful micturition, unspecified: Secondary | ICD-10-CM

## 2023-02-19 DIAGNOSIS — N760 Acute vaginitis: Secondary | ICD-10-CM

## 2023-02-19 DIAGNOSIS — N898 Other specified noninflammatory disorders of vagina: Secondary | ICD-10-CM | POA: Insufficient documentation

## 2023-02-19 DIAGNOSIS — E118 Type 2 diabetes mellitus with unspecified complications: Secondary | ICD-10-CM

## 2023-02-19 LAB — POCT GLYCOSYLATED HEMOGLOBIN (HGB A1C): HbA1c POC (<> result, manual entry): >15 % (ref 4.0–5.6)

## 2023-02-19 MED ORDER — FLUCONAZOLE 150 MG PO TABS
150.0000 mg | ORAL_TABLET | ORAL | 0 refills | Status: DC
Start: 2023-02-19 — End: 2023-02-19
  Filled 2023-02-19: qty 3, 9d supply, fill #0

## 2023-02-19 MED ORDER — BASAGLAR KWIKPEN 100 UNIT/ML ~~LOC~~ SOPN
30.0000 [IU] | PEN_INJECTOR | Freq: Every day | SUBCUTANEOUS | 1 refills | Status: DC
Start: 2023-02-19 — End: 2023-02-19
  Filled 2023-02-19: qty 9, 30d supply, fill #0

## 2023-02-19 MED ORDER — LABETALOL HCL 300 MG PO TABS
600.0000 mg | ORAL_TABLET | Freq: Two times a day (BID) | ORAL | 1 refills | Status: DC
Start: 2023-02-19 — End: 2023-02-19
  Filled 2023-02-19: qty 360, 90d supply, fill #0

## 2023-02-19 MED ORDER — FLUCONAZOLE 150 MG PO TABS
150.0000 mg | ORAL_TABLET | ORAL | 0 refills | Status: DC
Start: 2023-02-19 — End: 2023-04-09
  Filled 2023-02-19: qty 3, 9d supply, fill #0

## 2023-02-19 MED ORDER — CLONIDINE 0.3 MG/24HR TD PTWK
0.3000 mg | MEDICATED_PATCH | TRANSDERMAL | 1 refills | Status: DC
  Filled 2023-02-19: qty 12, 84d supply, fill #0

## 2023-02-19 MED ORDER — LOSARTAN POTASSIUM 50 MG PO TABS
50.0000 mg | ORAL_TABLET | Freq: Every day | ORAL | 0 refills | Status: DC
Start: 2023-02-19 — End: 2023-05-23
  Filled 2023-02-19 – 2023-03-10 (×2): qty 90, 90d supply, fill #0

## 2023-02-19 MED ORDER — GLIPIZIDE ER 10 MG PO TB24
10.0000 mg | ORAL_TABLET | Freq: Every day | ORAL | 1 refills | Status: DC
Start: 2023-02-19 — End: 2023-07-14
  Filled 2023-02-19 – 2023-03-10 (×2): qty 90, 90d supply, fill #0
  Filled 2023-05-23 – 2023-06-16 (×3): qty 90, 90d supply, fill #1

## 2023-02-19 MED ORDER — CLONIDINE 0.3 MG/24HR TD PTWK
0.3000 mg | MEDICATED_PATCH | TRANSDERMAL | 2 refills | Status: DC
Start: 2023-02-19 — End: 2023-07-14
  Filled 2023-02-19: qty 12, 84d supply, fill #0

## 2023-02-19 MED ORDER — BASAGLAR KWIKPEN 100 UNIT/ML ~~LOC~~ SOPN
30.0000 [IU] | PEN_INJECTOR | Freq: Every day | SUBCUTANEOUS | 1 refills | Status: DC
Start: 2023-02-19 — End: 2023-07-14
  Filled 2023-02-19: qty 9, 30d supply, fill #0
  Filled 2023-05-23 (×2): qty 9, 30d supply, fill #1

## 2023-02-19 MED ORDER — ATORVASTATIN CALCIUM 20 MG PO TABS
20.0000 mg | ORAL_TABLET | Freq: Every day | ORAL | 1 refills | Status: DC
Start: 2023-02-19 — End: 2023-07-14
  Filled 2023-02-19 – 2023-03-10 (×2): qty 90, 90d supply, fill #0
  Filled 2023-05-23 – 2023-06-16 (×3): qty 90, 90d supply, fill #1

## 2023-02-19 MED ORDER — LABETALOL HCL 300 MG PO TABS
600.0000 mg | ORAL_TABLET | Freq: Two times a day (BID) | ORAL | 1 refills | Status: DC
Start: 2023-02-19 — End: 2023-07-14
  Filled 2023-02-19: qty 360, 90d supply, fill #0
  Filled 2023-03-10: qty 84, 21d supply, fill #0
  Filled 2023-03-10: qty 36, 9d supply, fill #0
  Filled 2023-04-09 – 2023-04-17 (×2): qty 120, 30d supply, fill #1
  Filled 2023-05-23 (×2): qty 120, 30d supply, fill #2
  Filled 2023-06-16 – 2023-07-07 (×2): qty 120, 30d supply, fill #3

## 2023-02-19 NOTE — Progress Notes (Signed)
Assessment & Plan:  [name] was seen today for diabetes and hypertension.  Diagnoses and all orders for this visit:  Controlled type 2 diabetes mellitus with complication, without long-term current use of insulin  Poorly controlled -     POCT glycosylated hemoglobin (Hb A1C) -     Discontinue: Insulin Glargine (BASAGLAR KWIKPEN) 100 UNIT/ML; Inject 30 Units into the skin daily. -     Insulin Glargine (BASAGLAR KWIKPEN) 100 UNIT/ML; Inject 30 Units into the skin daily. -     glipiZIDE (GLUCOTROL XL) 10 MG 24 hr tablet; Take 1 tablet (10 mg total) by mouth daily with breakfast. Tome 1 tableta por va oral al da para la diabetes.  Primary hypertension Poorly controlled.  -     losartan (COZAAR) 50 MG tablet; Take 1 tablet (50 mg total) by mouth daily. Tomar 1 comprimido al da para la presin arterial. -     labetalol (NORMODYNE) 300 MG tablet; Take 2 tablets (600 mg total) by mouth 2 (two) times daily. Tomar 2 comprimidos dos veces al da para la presin arterial alta. -     cloNIDine (CATAPRES - DOSED IN MG/24 HR) 0.3 mg/24hr patch; Place 1 patch (0.3 mg total) onto the skin once a week. coloque un parche sobre la piel semanalmente. Para la presin arterial   Acute vaginitis -     Cervicovaginal ancillary only -     fluconazole (DIFLUCAN) 150 MG tablet; Take 1 tablet (150 mg total) by mouth every 3 (three) days for 7 doses. Tomar 1 comprimido cada 3 das durante 1 semana para la irritacin vaginal.  Painful urination -     Urinalysis, Complete  Dyslipidemia, goal LDL below 70 -     atorvastatin (LIPITOR) 20 MG tablet; Take 1 tablet (20 mg total) by mouth daily. para el colesterol. tomar 1 tableta por va oral al da INSTRUCTIONS: Work on a low fat, heart healthy diet and participate in regular aerobic exercise program by working out at least 150 minutes per week; 5 days a week-30 minutes per day. Avoid red meat/beef/steak,  fried foods. junk foods, sodas, sugary drinks, unhealthy  snacking, alcohol and smoking.  Drink at least 80 oz of water per day and monitor your carbohydrate intake daily.      Patient has been counseled on age-appropriate routine health concerns for screening and prevention. These are reviewed and up-to-date. Referrals have been placed accordingly. Immunizations are up-to-date or declined.    Subjective:   Chief Complaint  Patient presents with   Diabetes   Hypertension   Diabetes Pertinent negatives for hypoglycemia include no dizziness, headaches or seizures. Pertinent negatives for diabetes include no blurred vision, no chest pain and no weight loss.  Hypertension Pertinent negatives include no blurred vision, chest pain, headaches, malaise/fatigue, palpitations or shortness of breath.   Renee Porter 37 y.o. female presents to office today for follow up to DM and HTN.  Long standing history of non adherence. She does not adhere to timely f/u office visits nor does she pick up her medications from the pharmacy in a timely manner.   VRI was used to communicate directly with patient for the entire encounter including providing detailed patient instructions.    DM 2 Poorly controlled.  She does not check her blood sugars at home. She has CKD stage 4 and is currently being followed by Nephrology.  Hyperglycemic symptoms include blurred vision.  A1c is  >15 today.  Currently taking Basaglar 10 units daily and she  will continue on glipizide 10 mg daily.  I have instructed her to not skip meals and to monitor her blood sugar readings fasting and postprandial.  She has yet to bring me a food diary and or her meter for review. Although her weight is down I believe her lack of eating is likely contributing to higher readings.  Lab Results  Component Value Date   HGBA1C >15 02/19/2023    Lab Results  Component Value Date   HGBA1C >15 07/22/2022     HTN Blood pressure is elevated. She has CKD stage 4 and is currently being followed by  Nephrology. She is taking labetalol  600 mg BID, clonidine patch 0.2 mg weekly and losartan 50 mg daily. I will be increasing her dosage of clonidine patch to 0.3 mg.  BP Readings from Last 3 Encounters:  02/19/23 (!) 170/107  07/22/22 (!) 148/95  04/15/22 (!) 169/111    Vaginitis Endorses vaginal irritation and itching as well as dysuria with no hematuria or flank pain. Denies malodorous vaginal discharge or pelvic pain.    Review of Systems  Constitutional:  Negative for fever, malaise/fatigue and weight loss.  HENT: Negative.  Negative for nosebleeds.   Eyes: Negative.  Negative for blurred vision, double vision and photophobia.  Respiratory: Negative.  Negative for cough and shortness of breath.   Cardiovascular: Negative.  Negative for chest pain, palpitations and leg swelling.  Gastrointestinal: Negative.  Negative for heartburn, nausea and vomiting.  Genitourinary:  Positive for dysuria. Negative for flank pain, frequency, hematuria and urgency.       SEE HPI  Musculoskeletal: Negative.  Negative for myalgias.  Neurological: Negative.  Negative for dizziness, focal weakness, seizures and headaches.  Psychiatric/Behavioral: Negative.  Negative for suicidal ideas.     Past Medical History:  Diagnosis Date   CKD stage 4 secondary to hypertension (HCC)    Diabetes mellitus without complication (HCC)    Hypertension    Prediabetes     Past Surgical History:  Procedure Laterality Date   CESAREAN SECTION     FOOT SURGERY      Family History  Problem Relation Age of Onset   Diabetes Father     Social History Reviewed with no changes to be made today.   Outpatient Medications Prior to Visit  Medication Sig Dispense Refill   acetaminophen (TYLENOL) 325 MG tablet Take 325 mg by mouth every 6 (six) hours as needed for moderate pain or headache.     Blood Glucose Monitoring Suppl (TRUE METRIX METER) w/Device KIT Use as instructed. Check blood glucose level by fingerstick  three times per day. 1 kit 0   glucose blood (TRUE METRIX BLOOD GLUCOSE TEST) test strip Use as instructed. Check blood glucose level by fingerstick three times per day. 200 each 12   TRUEplus Lancets 28G MISC Use as instructed. Check blood glucose level by fingerstick three times per day. 200 each 3   atorvastatin (LIPITOR) 20 MG tablet Take 1 tablet (20 mg total) by mouth daily. 30 tablet 0   cloNIDine (CATAPRES - DOSED IN MG/24 HR) 0.2 mg/24hr patch Place 1 patch (0.2 mg total) onto the skin once a week. 12 patch 1   glipiZIDE (GLUCOTROL XL) 10 MG 24 hr tablet Take 1 tablet (10 mg total) by mouth daily with breakfast (Must have office visit for refills) 30 tablet 0   labetalol (NORMODYNE) 300 MG tablet Take 2 tablets (600 mg total) by mouth 2 (two) times daily. 360 tablet 1  losartan (COZAAR) 50 MG tablet Take 1 tablet (50 mg total) by mouth daily. 90 tablet 0   cloNIDine (CATAPRES - DOSED IN MG/24 HR) 0.3 mg/24hr patch Place 1 patch (0.3 mg total) onto the skin once a week. (Patient not taking: Reported on 02/19/2023) 12 patch 1   Insulin Glargine (BASAGLAR KWIKPEN) 100 UNIT/ML Inject 10 Units into the skin daily. (Patient not taking: Reported on 02/19/2023) 9 mL 1   Prenatal Vit-Fe Fumarate-FA (PRENATAL VITAMINS PO) Take 1 tablet by mouth daily in the afternoon. (Patient not taking: Reported on 02/19/2023)     No facility-administered medications prior to visit.    No Known Allergies     Objective:    BP (!) 170/107 (BP Location: Left Arm, Patient Position: Sitting, Cuff Size: Normal)   Pulse 87   Ht 4\' 9"  (1.448 m)   Wt 161 lb 9.6 oz (73.3 kg)   LMP 01/06/2023 (Approximate)   SpO2 100%   BMI 34.97 kg/m  Wt Readings from Last 3 Encounters:  02/19/23 161 lb 9.6 oz (73.3 kg)  07/22/22 171 lb (77.6 kg)  04/15/22 185 lb 9.6 oz (84.2 kg)    Physical Exam Vitals and nursing note reviewed.  Constitutional:      Appearance: She is well-developed.  HENT:     Head: Normocephalic and  atraumatic.  Cardiovascular:     Rate and Rhythm: Normal rate and regular rhythm.     Heart sounds: Normal heart sounds. No murmur heard.    No friction rub. No gallop.  Pulmonary:     Effort: Pulmonary effort is normal. No tachypnea or respiratory distress.     Breath sounds: Normal breath sounds. No decreased breath sounds, wheezing, rhonchi or rales.  Chest:     Chest wall: No tenderness.  Abdominal:     General: Bowel sounds are normal.     Palpations: Abdomen is soft.  Musculoskeletal:        General: Normal range of motion.     Cervical back: Normal range of motion.  Skin:    General: Skin is warm and dry.  Neurological:     Mental Status: She is alert and oriented to person, place, and time.     Coordination: Coordination normal.  Psychiatric:        Behavior: Behavior normal. Behavior is cooperative.        Thought Content: Thought content normal.        Judgment: Judgment normal.          Patient has been counseled extensively about nutrition and exercise as well as the importance of adherence with medications and regular follow-up. The patient was given clear instructions to go to ER or return to medical center if symptoms don't improve, worsen or new problems develop. The patient verbalized understanding.   Follow-up: Return in about 3 months (around 05/22/2023).   Claiborne Rigg, FNP-BC [facility] and Wellness [place], [place] [phone]   02/21/2023, 9:55 PM

## 2023-02-20 LAB — URINALYSIS, COMPLETE
Bilirubin, UA: NEGATIVE
Ketones, UA: NEGATIVE
Nitrite, UA: NEGATIVE
Specific Gravity, UA: 1.019 (ref 1.005–1.030)
Urobilinogen, Ur: 0.2 mg/dL (ref 0.2–1.0)
pH, UA: 6 (ref 5.0–7.5)

## 2023-02-20 LAB — CERVICOVAGINAL ANCILLARY ONLY
Bacterial Vaginitis (gardnerella): POSITIVE — AB
Candida Glabrata: NEGATIVE
Candida Vaginitis: POSITIVE — AB
Chlamydia: NEGATIVE
Comment: NEGATIVE
Comment: NEGATIVE
Comment: NEGATIVE
Comment: NEGATIVE
Comment: NEGATIVE
Comment: NORMAL
Neisseria Gonorrhea: NEGATIVE
Trichomonas: NEGATIVE

## 2023-02-20 LAB — MICROSCOPIC EXAMINATION
Casts: NONE SEEN /lpf
RBC, Urine: NONE SEEN /hpf (ref 0–2)

## 2023-02-20 LAB — LIPID PANEL
Chol/HDL Ratio: 3.8 ratio (ref 0.0–4.4)
Cholesterol, Total: 139 mg/dL (ref 100–199)
HDL: 37 mg/dL — ABNORMAL LOW (ref >39)
LDL Chol Calc (NIH): 59 mg/dL (ref 0–99)
Triglycerides: 268 mg/dL — ABNORMAL HIGH (ref 0–149)
VLDL Cholesterol Cal: 43 mg/dL — ABNORMAL HIGH (ref 5–40)

## 2023-02-21 MED ORDER — METRONIDAZOLE 500 MG PO TABS
500.0000 mg | ORAL_TABLET | Freq: Two times a day (BID) | ORAL | 0 refills | Status: DC
Start: 2023-02-21 — End: 2023-04-09
  Filled 2023-02-21: qty 14, 7d supply, fill #0

## 2023-02-24 ENCOUNTER — Other Ambulatory Visit: Payer: Self-pay

## 2023-02-24 ENCOUNTER — Other Ambulatory Visit (HOSPITAL_BASED_OUTPATIENT_CLINIC_OR_DEPARTMENT_OTHER): Payer: Self-pay

## 2023-03-10 ENCOUNTER — Other Ambulatory Visit: Payer: Self-pay

## 2023-03-12 ENCOUNTER — Other Ambulatory Visit: Payer: Self-pay

## 2023-04-09 ENCOUNTER — Other Ambulatory Visit: Payer: Self-pay

## 2023-04-09 ENCOUNTER — Other Ambulatory Visit: Payer: Self-pay | Admitting: Nurse Practitioner

## 2023-04-09 DIAGNOSIS — N760 Acute vaginitis: Secondary | ICD-10-CM

## 2023-04-09 MED ORDER — METRONIDAZOLE 500 MG PO TABS
500.0000 mg | ORAL_TABLET | Freq: Two times a day (BID) | ORAL | 0 refills | Status: AC
Start: 2023-04-09 — End: 2023-08-21
  Filled 2023-04-09 – 2023-08-14 (×2): qty 14, 7d supply, fill #0

## 2023-04-09 MED ORDER — FLUCONAZOLE 150 MG PO TABS
150.0000 mg | ORAL_TABLET | ORAL | 0 refills | Status: DC
Start: 2023-04-09 — End: 2023-07-14
  Filled 2023-04-09: qty 3, 9d supply, fill #0

## 2023-04-15 ENCOUNTER — Other Ambulatory Visit: Payer: Self-pay

## 2023-04-17 ENCOUNTER — Other Ambulatory Visit: Payer: Self-pay

## 2023-04-21 ENCOUNTER — Other Ambulatory Visit: Payer: Self-pay

## 2023-05-23 ENCOUNTER — Other Ambulatory Visit: Payer: Self-pay | Admitting: Nurse Practitioner

## 2023-05-23 ENCOUNTER — Other Ambulatory Visit: Payer: Self-pay

## 2023-05-23 DIAGNOSIS — I10 Essential (primary) hypertension: Secondary | ICD-10-CM

## 2023-05-23 MED ORDER — LOSARTAN POTASSIUM 50 MG PO TABS
50.0000 mg | ORAL_TABLET | Freq: Every day | ORAL | 0 refills | Status: AC
Start: 2023-05-23 — End: ?
  Filled 2023-05-23 – 2023-06-16 (×2): qty 30, 30d supply, fill #0

## 2023-05-26 ENCOUNTER — Other Ambulatory Visit: Payer: Self-pay

## 2023-05-26 ENCOUNTER — Ambulatory Visit: Payer: Self-pay | Admitting: Nurse Practitioner

## 2023-06-16 ENCOUNTER — Other Ambulatory Visit: Payer: Self-pay

## 2023-07-07 ENCOUNTER — Other Ambulatory Visit: Payer: Self-pay | Admitting: Nurse Practitioner

## 2023-07-07 ENCOUNTER — Other Ambulatory Visit: Payer: Self-pay

## 2023-07-07 ENCOUNTER — Other Ambulatory Visit: Payer: Self-pay | Admitting: Family Medicine

## 2023-07-07 DIAGNOSIS — I10 Essential (primary) hypertension: Secondary | ICD-10-CM

## 2023-07-07 DIAGNOSIS — E785 Hyperlipidemia, unspecified: Secondary | ICD-10-CM

## 2023-07-08 ENCOUNTER — Other Ambulatory Visit: Payer: Self-pay

## 2023-07-08 MED ORDER — LOSARTAN POTASSIUM 50 MG PO TABS
50.0000 mg | ORAL_TABLET | Freq: Every day | ORAL | 0 refills | Status: DC
Start: 2023-07-08 — End: 2023-07-14
  Filled 2023-07-08: qty 7, 7d supply, fill #0

## 2023-07-08 NOTE — Telephone Encounter (Signed)
Requested by interface surescripts. Last dispensed 06/16/23  #90 Last labs 02/19/23 future visit in 6 days.  Requested Prescriptions  Refused Prescriptions Disp Refills   atorvastatin (LIPITOR) 20 MG tablet 90 tablet 1    Sig: Take 1 tablet (20 mg total) by mouth daily. para el colesterol. tomar 1 tableta por va oral al da     Cardiovascular:  Antilipid - Statins Failed - 07/07/2023  7:44 AM      Failed - Lipid Panel in normal range within the last 12 months    Cholesterol, Total  Date Value Ref Range Status  02/19/2023 139 100 - 199 mg/dL Final   LDL Chol Calc (NIH)  Date Value Ref Range Status  02/19/2023 59 0 - 99 mg/dL Final   HDL  Date Value Ref Range Status  02/19/2023 37 (L) >39 mg/dL Final   Triglycerides  Date Value Ref Range Status  02/19/2023 268 (H) 0 - 149 mg/dL Final         Passed - Patient is not pregnant      Passed - Valid encounter within last 12 months    Recent Outpatient Visits           4 months ago Controlled type 2 diabetes mellitus with complication, without long-term current use of insulin [facility])   [place] [facility] [place], Shea Stakes, NP   11 months ago Primary hypertension   [place], Shea Stakes, NP   1 year ago Primary hypertension   [place] [facility] [place], Shea Stakes, NP   1 year ago Primary hypertension   [place] [facility] [place], Shea Stakes, NP   1 year ago Primary hypertension   [place] [facility] [place], Shea Stakes, NP       Future Appointments             In 6 days Claiborne Rigg, NP [facility]

## 2023-07-08 NOTE — Telephone Encounter (Signed)
Requested medications are due for refill today.  yes  Requested medications are on the active medications list.  yes  Last refill. 05/23/2023 #30 0 rf  Future visit scheduled.   yes  Notes to clinic.  Labs are expired.    Requested Prescriptions  Pending Prescriptions Disp Refills   losartan (COZAAR) 50 MG tablet 30 tablet 0    Sig: Take 1 tablet (50 mg total) by mouth daily. Tomar 1 comprimido al da para la presin arterial.     Cardiovascular:  Angiotensin Receptor Blockers Failed - 07/07/2023  7:44 AM      Failed - Cr in normal range and within 180 days    Creatinine, Ser  Date Value Ref Range Status  04/15/2022 2.45 (H) 0.57 - 1.00 mg/dL Final   Creatinine, POC  Date Value Ref Range Status  12/26/2022 34.5 mg/dL Final    Comment:    abstracted by HIM   Creatinine, Urine  Date Value Ref Range Status  06/28/2019 22.76 mg/dL Final         Failed - K in normal range and within 180 days    Potassium  Date Value Ref Range Status  04/15/2022 5.3 (H) 3.5 - 5.2 mmol/L Final         Failed - Last BP in normal range    BP Readings from Last 1 Encounters:  02/19/23 (!) 170/107         Passed - Patient is not pregnant      Passed - Valid encounter within last 6 months    Recent Outpatient Visits           4 months ago Controlled type 2 diabetes mellitus with complication, without long-term current use of insulin (HCC)   [place] [facility] [place], Shea Stakes, NP   11 months ago Primary hypertension   [place] [facility] [place], Shea Stakes, NP   1 year ago Primary hypertension   [place] [facility] [place], Shea Stakes, NP   1 year ago Primary hypertension   [place] [facility] [place], Shea Stakes, NP   1 year ago Primary hypertension   [place] [facility] [place], Shea Stakes, NP       Future Appointments             In 6 days Claiborne Rigg, NP [facility]

## 2023-07-09 ENCOUNTER — Other Ambulatory Visit: Payer: Self-pay

## 2023-07-11 ENCOUNTER — Other Ambulatory Visit: Payer: Self-pay

## 2023-07-14 ENCOUNTER — Ambulatory Visit: Payer: Self-pay | Attending: Nurse Practitioner | Admitting: Nurse Practitioner

## 2023-07-14 ENCOUNTER — Encounter: Payer: Self-pay | Admitting: Nurse Practitioner

## 2023-07-14 ENCOUNTER — Other Ambulatory Visit (HOSPITAL_COMMUNITY)
Admission: RE | Admit: 2023-07-14 | Discharge: 2023-07-14 | Disposition: A | Discharge status: Home / Self Care | Payer: Self-pay | Source: Ambulatory Visit | Attending: Nurse Practitioner | Admitting: Nurse Practitioner

## 2023-07-14 ENCOUNTER — Other Ambulatory Visit: Payer: Self-pay

## 2023-07-14 VITALS — BP 147/101 | HR 81 | Wt 156.0 lb

## 2023-07-14 DIAGNOSIS — E785 Hyperlipidemia, unspecified: Secondary | ICD-10-CM

## 2023-07-14 DIAGNOSIS — I10 Essential (primary) hypertension: Secondary | ICD-10-CM

## 2023-07-14 DIAGNOSIS — Z7985 Long-term (current) use of injectable non-insulin antidiabetic drugs: Secondary | ICD-10-CM

## 2023-07-14 DIAGNOSIS — E118 Type 2 diabetes mellitus with unspecified complications: Secondary | ICD-10-CM

## 2023-07-14 DIAGNOSIS — N184 Chronic kidney disease, stage 4 (severe): Secondary | ICD-10-CM

## 2023-07-14 DIAGNOSIS — R300 Dysuria: Secondary | ICD-10-CM

## 2023-07-14 DIAGNOSIS — N760 Acute vaginitis: Secondary | ICD-10-CM | POA: Insufficient documentation

## 2023-07-14 DIAGNOSIS — Z794 Long term (current) use of insulin: Secondary | ICD-10-CM

## 2023-07-14 LAB — POCT URINALYSIS DIP (CLINITEK)
Bilirubin, UA: NEGATIVE
Glucose, UA: 500 mg/dL — AB
Ketones, POC UA: NEGATIVE mg/dL
Nitrite, UA: NEGATIVE
POC PROTEIN,UA: 30 — AB
Spec Grav, UA: 1.01 (ref 1.010–1.025)
Urobilinogen, UA: 0.2 U/dL
pH, UA: 6 (ref 5.0–8.0)

## 2023-07-14 LAB — POCT GLYCOSYLATED HEMOGLOBIN (HGB A1C): HbA1c POC (<> result, manual entry): >15 % (ref 4.0–5.6)

## 2023-07-14 LAB — GLUCOSE, POCT (MANUAL RESULT ENTRY): POC Glucose: 343 mg/dL — AB (ref 70–99)

## 2023-07-14 MED ORDER — BASAGLAR KWIKPEN 100 UNIT/ML ~~LOC~~ SOPN
20.0000 [IU] | PEN_INJECTOR | Freq: Two times a day (BID) | SUBCUTANEOUS | 1 refills | Status: DC
  Filled 2023-07-14 – 2023-07-15 (×2): qty 15, 38d supply, fill #0
  Filled 2023-09-19 – 2023-09-20 (×2): qty 15, 38d supply, fill #1

## 2023-07-14 MED ORDER — TRUEPLUS LANCETS 28G MISC
3 refills | Status: DC
  Filled 2023-07-14: qty 100, 33d supply, fill #0

## 2023-07-14 MED ORDER — LOSARTAN POTASSIUM 50 MG PO TABS
50.0000 mg | ORAL_TABLET | Freq: Every day | ORAL | 1 refills | Status: DC
  Filled 2023-07-14: qty 90, 90d supply, fill #0

## 2023-07-14 MED ORDER — GLIPIZIDE ER 10 MG PO TB24
10.0000 mg | ORAL_TABLET | Freq: Every day | ORAL | 1 refills | Status: DC
  Filled 2023-07-14 – 2023-09-20 (×4): qty 90, 90d supply, fill #0
  Filled 2023-12-13 – 2023-12-15 (×2): qty 90, 90d supply, fill #1

## 2023-07-14 MED ORDER — ATORVASTATIN CALCIUM 20 MG PO TABS: 20.0000 mg | ORAL_TABLET | Freq: Every day | ORAL | 1 refills | Status: DC

## 2023-07-14 MED ORDER — LABETALOL HCL 300 MG PO TABS
600.0000 mg | ORAL_TABLET | Freq: Two times a day (BID) | ORAL | 1 refills | Status: DC
  Filled 2023-07-14 – 2023-08-12 (×2): qty 360, 90d supply, fill #0
  Filled 2023-12-13: qty 360, 90d supply, fill #1

## 2023-07-14 MED ORDER — CLONIDINE 0.3 MG/24HR TD PTWK
0.3000 mg | MEDICATED_PATCH | TRANSDERMAL | 2 refills | Status: DC
  Filled 2023-07-14 (×2): qty 4, 28d supply, fill #0
  Filled 2023-08-12: qty 4, 28d supply, fill #1
  Filled 2023-08-12: qty 12, 84d supply, fill #1
  Filled 2023-09-19 – 2023-09-20 (×2): qty 4, 28d supply, fill #2
  Filled 2023-12-13: qty 4, 28d supply, fill #3

## 2023-07-14 MED ORDER — FLUCONAZOLE 150 MG PO TABS
150.0000 mg | ORAL_TABLET | ORAL | 0 refills | Status: DC
  Filled 2023-07-14: qty 3, 9d supply, fill #0

## 2023-07-14 NOTE — Progress Notes (Signed)
Assessment & Plan:  Diagnoses and all orders for this visit:  Controlled type 2 diabetes mellitus with complication, without long-term current use of insulin  Poorly controlled DOSE CHANGE increase lantus to 20units BID from 30 units daily. -     POCT glucose (manual entry) -     POCT glycosylated hemoglobin (Hb A1C) -     Insulin Glargine (BASAGLAR KWIKPEN) 100 UNIT/ML; Inject 20 Units into the skin 2 (two) times daily. inyectar 20 units en la piel dos veces al da -     TRUEplus Lancets 28G MISC; Use as instructed. Check blood glucose level by fingerstick three times per day. -     glipiZIDE (GLUCOTROL XL) 10 MG 24 hr tablet; Take 1 tablet (10 mg total) by mouth daily with breakfast. Tome 1 tableta por va oral al da para la diabetes.  Dyslipidemia, goal LDL below 70 -     atorvastatin (LIPITOR) 20 MG tablet; Take 1 tablet (20 mg total) by mouth daily. INSTRUCTIONS: Work on a low fat, heart healthy diet and participate in regular aerobic exercise program by working out at least 150 minutes per week; 5 days a week-30 minutes per day. Avoid red meat/beef/steak,  fried foods. junk foods, sodas, sugary drinks, unhealthy snacking, alcohol and smoking.  Drink at least 80 oz of water per day and monitor your carbohydrate intake daily.    Burning with urination -     POCT URINALYSIS DIP (CLINITEK) -     Urine Culture  Primary hypertension Pick up clonidine and resume all other antihypertensives -     cloNIDine (CATAPRES - DOSED IN MG/24 HR) 0.3 mg/24hr patch; Place 1 patch (0.3 mg total) onto the skin once a week. coloque un parche sobre la piel semanalmente. Para la presion arterial -     losartan (COZAAR) 50 MG tablet; Take 1 tablet (50 mg total) by mouth daily. Tomar 1 comprimido al da para la presin arterial. -     labetalol (NORMODYNE) 300 MG tablet; Take 2 tablets (600 mg total) by mouth 2 (two) times daily. Tomar 2 comprimidos dos veces al da para la presin arterial alta.  Acute  vaginitis -     Urine Culture -     fluconazole (DIFLUCAN) 150 MG tablet; Take 1 tablet (150 mg total) by mouth every 3 (three) days. Tomar 1 tableta cada 3 das durante 1 semana para la irritacin vaginal.  Stage 4 chronic kidney disease (HCC) -     CMP14+EGFR -     CBC with Differential    Patient has been counseled on age-appropriate routine health concerns for screening and prevention. These are reviewed and up-to-date. Referrals have been placed accordingly. Immunizations are up-to-date or declined.    Subjective:  No chief complaint on file.  HPI Renee Porter 37 y.o. female presents to office today for follow up to DM   VRI was used to communicate directly with patient for the entire encounter including providing detailed patient instructions.     DM 2 Poorly controlled. Fasting blood sugars averaging: she states good but can not give me any specific readings. She states she does not look at her numbers on the machine and has someone else check her blood sugars at home and they tell her if they are high  or low or normal.  Lab Results  Component Value Date   HGBA1C >15 07/14/2023      HTN Poorly controlled. She has not been to the pharmacy to pick up  her clonidine patch. States she was told she needed an appointment before it could be filled. Does not monitor her blood pressure at home. Uninsured so BP device would be out of pocket cost.Currently prescribed losartan and labetalol  BP Readings from Last 3 Encounters:  07/14/23 (!) 147/101  02/19/23 (!) 170/107  07/22/22 (!) 148/95     GU  Notes burning and itching in the vagina. She has frequent episodes of yeast vaginitis due to poorly controlled diabetes.   Review of Systems  Constitutional:  Negative for fever, malaise/fatigue and weight loss.  HENT: Negative.  Negative for nosebleeds.   Eyes: Negative.  Negative for blurred vision, double vision and photophobia.  Respiratory: Negative.  Negative for cough and  shortness of breath.   Cardiovascular: Negative.  Negative for chest pain, palpitations and leg swelling.  Gastrointestinal: Negative.  Negative for heartburn, nausea and vomiting.  Genitourinary:  Negative for dysuria, flank pain, frequency, hematuria and urgency.       SEE HPI  Musculoskeletal: Negative.  Negative for myalgias.  Neurological: Negative.  Negative for dizziness, focal weakness, seizures and headaches.  Psychiatric/Behavioral: Negative.  Negative for suicidal ideas.     Past Medical History:  Diagnosis Date   CKD stage 4 secondary to hypertension (HCC)    Diabetes mellitus without complication (HCC)    Hypertension    Prediabetes     Past Surgical History:  Procedure Laterality Date   CESAREAN SECTION     FOOT SURGERY      Family History  Problem Relation Age of Onset   Diabetes Father     Social History Reviewed with no changes to be made today.   Outpatient Medications Prior to Visit  Medication Sig Dispense Refill   acetaminophen (TYLENOL) 325 MG tablet Take 325 mg by mouth every 6 (six) hours as needed for moderate pain or headache.     Blood Glucose Monitoring Suppl (TRUE METRIX METER) w/Device KIT Use as instructed. Check blood glucose level by fingerstick three times per day. 1 kit 0   glucose blood (TRUE METRIX BLOOD GLUCOSE TEST) test strip Use as instructed. Check blood glucose level by fingerstick three times per day. 200 each 12   atorvastatin (LIPITOR) 20 MG tablet Take 1 tablet (20 mg total) by mouth daily. para el colesterol. tomar 1 tableta por va oral al da 90 tablet 1   cloNIDine (CATAPRES - DOSED IN MG/24 HR) 0.3 mg/24hr patch Place 1 patch (0.3 mg total) onto the skin once a week. coloque un parche sobre la piel semanalmente. Para la presin arterial 12 patch 2   fluconazole (DIFLUCAN) 150 MG tablet Take 1 tablet (150 mg total) by mouth every 3 (three) days. Tomar 1 tableta cada 3 das durante 1 semana para la irritacin vaginal. 3 tablet 0    glipiZIDE (GLUCOTROL XL) 10 MG 24 hr tablet Take 1 tablet (10 mg total) by mouth daily with breakfast. Tome 1 tableta por va oral al da para la diabetes. 90 tablet 1   Insulin Glargine (BASAGLAR KWIKPEN) 100 UNIT/ML Inject 30 Units into the skin daily.Inyectar 30 unidades en la piel todas las noches 9 mL 1   labetalol (NORMODYNE) 300 MG tablet Take 2 tablets (600 mg total) by mouth 2 (two) times daily. Tomar 2 comprimidos dos veces al da para la presin arterial alta. 360 tablet 1   losartan (COZAAR) 50 MG tablet Take 1 tablet (50 mg total) by mouth daily. Tomar 1 comprimido al da para la presin  arterial. 7 tablet 0   TRUEplus Lancets 28G MISC Use as instructed. Check blood glucose level by fingerstick three times per day. 200 each 3   No facility-administered medications prior to visit.    No Known Allergies     Objective:    BP (!) 147/101 (BP Location: Left Arm, Patient Position: Sitting, Cuff Size: Normal)   Pulse 81   Wt 156 lb (70.8 kg)   SpO2 100%   BMI 33.76 kg/m  Wt Readings from Last 3 Encounters:  07/14/23 156 lb (70.8 kg)  02/19/23 161 lb 9.6 oz (73.3 kg)  07/22/22 171 lb (77.6 kg)    Physical Exam Vitals and nursing note reviewed.  Constitutional:      Appearance: She is well-developed.  HENT:     Head: Normocephalic and atraumatic.  Cardiovascular:     Rate and Rhythm: Normal rate and regular rhythm.     Heart sounds: Normal heart sounds. No murmur heard.    No friction rub. No gallop.  Pulmonary:     Effort: Pulmonary effort is normal. No tachypnea or respiratory distress.     Breath sounds: Normal breath sounds. No decreased breath sounds, wheezing, rhonchi or rales.  Chest:     Chest wall: No tenderness.  Abdominal:     General: Bowel sounds are normal.     Palpations: Abdomen is soft.  Genitourinary:    Comments: SELF SWAB Musculoskeletal:        General: Normal range of motion.     Cervical back: Normal range of motion.  Skin:    General:  Skin is warm and dry.  Neurological:     Mental Status: She is alert and oriented to person, place, and time.     Coordination: Coordination normal.  Psychiatric:        Behavior: Behavior normal. Behavior is cooperative.        Thought Content: Thought content normal.        Judgment: Judgment normal.          Patient has been counseled extensively about nutrition and exercise as well as the importance of adherence with medications and regular follow-up. The patient was given clear instructions to go to ER or return to medical center if symptoms don't improve, worsen or new problems develop. The patient verbalized understanding.   Follow-up: Return in about 4 weeks (around 08/11/2023) for 4-6 weeks meter check and blood pressure check with me luke or angela.   Claiborne Rigg, FNP-BC [facility] [place], [place] [phone]   07/14/2023, 4:12 PM

## 2023-07-14 NOTE — Patient Instructions (Addendum)
Blood sugars in the morning before you eat should be 90-130   Blood sugars 1-2 hours after you eat should be less than 180

## 2023-07-15 ENCOUNTER — Other Ambulatory Visit: Payer: Self-pay

## 2023-07-16 LAB — CERVICOVAGINAL ANCILLARY ONLY
Bacterial Vaginitis (gardnerella): NEGATIVE
Candida Glabrata: NEGATIVE
Candida Vaginitis: POSITIVE — AB
Chlamydia: NEGATIVE
Comment: NEGATIVE
Comment: NEGATIVE
Comment: NEGATIVE
Comment: NEGATIVE
Comment: NEGATIVE
Comment: NORMAL
Neisseria Gonorrhea: NEGATIVE
Trichomonas: NEGATIVE

## 2023-07-16 LAB — URINE CULTURE

## 2023-07-17 ENCOUNTER — Other Ambulatory Visit: Payer: Self-pay

## 2023-07-21 ENCOUNTER — Other Ambulatory Visit: Payer: Self-pay | Admitting: Nurse Practitioner

## 2023-07-21 DIAGNOSIS — N760 Acute vaginitis: Secondary | ICD-10-CM

## 2023-07-29 ENCOUNTER — Other Ambulatory Visit: Payer: Self-pay

## 2023-07-29 MED ORDER — LOSARTAN POTASSIUM 50 MG PO TABS
50.0000 mg | ORAL_TABLET | Freq: Two times a day (BID) | ORAL | 6 refills | Status: DC
  Filled 2023-07-29: qty 60, 30d supply, fill #0
  Filled 2023-09-19 – 2023-09-20 (×2): qty 60, 30d supply, fill #1
  Filled 2023-12-13: qty 60, 30d supply, fill #2
  Filled 2023-12-15: qty 180, 90d supply, fill #2

## 2023-07-30 ENCOUNTER — Other Ambulatory Visit: Payer: Self-pay

## 2023-07-30 MED ORDER — LOSARTAN POTASSIUM 50 MG PO TABS
50.0000 mg | ORAL_TABLET | Freq: Two times a day (BID) | ORAL | 6 refills | Status: DC
  Filled 2023-07-30: qty 60, 30d supply, fill #0

## 2023-07-31 ENCOUNTER — Other Ambulatory Visit: Payer: Self-pay

## 2023-08-11 ENCOUNTER — Ambulatory Visit: Payer: Self-pay | Admitting: Pharmacist

## 2023-08-11 NOTE — Progress Notes (Unsigned)
S:     No chief complaint on file.  37 y.o. female who presents for diabetes evaluation, education, and management. Patient arrives in *** good spirits and presents without *** any assistance. ***Patient is accompanied by ***.   Patient was referred and last seen by Primary Care Provider, Bertram Denver, NP on 07/14/23.   PMH is significant for T2DM, HTN, CKD.  At last visit with Zelda BP was 147/101. Patient had been out of clonidine patch - was told she needed an appt for refills at the pharmacy. Was not monitoring BP at home because she does not have a cuff. Reported that someone helps her check her blood sugar at home and tells her if it is low, normal, or high. Could not recall any specific readings. A1c >15% at that visit. Zelda started glipizide and increased Basaglar from 30 units daily to 20 units twice a day.  Patient reports Diabetes was diagnosed in ***.   Family/Social History: ***  Current diabetes medications include: Basaglar 20 units twice daily, glipizide XL 10 mg daily Current hypertension medications include: clonidine 0.3 mg patch, labetalol 600 mg twice a day, losartan 50 mg twice daily Current hyperlipidemia medications include: atorvastatin 20 mg daily  Patient reports adherence to taking all medications as prescribed.  *** Patient denies adherence with medications, reports missing *** medications *** times per week, on average.  Do you feel that your medications are working for you? {YES NO:22349} Have you been experiencing any side effects to the medications prescribed? {YES NO:22349} Do you have any problems obtaining medications due to transportation or finances? {YES [id number] Insurance coverage: none  Patient {Actions; denies-reports:120008} hypoglycemic events.  Reported home fasting blood sugars: ***  Reported 2 hour post-meal/random blood sugars: ***.  Patient {Actions; denies-reports:120008} nocturia (nighttime urination).  Patient {Actions;  denies-reports:120008} neuropathy (nerve pain). Patient {Actions; denies-reports:120008} visual changes. Patient {Actions; denies-reports:120008} self foot exams.   Patient reported dietary habits: Eats *** meals/day Breakfast: *** Lunch: *** Dinner: *** Snacks: *** Drinks: ***  Within the past 12 months, did you worry whether your food would run out before you got money to buy more? {YES NO:22349} Within the past 12 months, did the food you bought run out, and you didn't have money to get more? {YES NO:22349} PHQ-9 Score: ***  Patient-reported exercise habits: ***   O:   ROS  Physical Exam  7 day average blood glucose: ***  Libre3 *** CGM Download today *** on *** % Time CGM is active: ***% Average Glucose: *** mg/dL Glucose Management Indicator: ***  Glucose Variability: ***% (goal <36%) Time in Goal:  - Time in range 70-180: ***% - Time above range: ***% - Time below range: ***% Observed patterns:   Lab Results  Component Value Date   HGBA1C >15 07/14/2023   There were no vitals filed for this visit.  Lipid Panel     Component Value Date/Time   CHOL 139 02/19/2023 1430   TRIG 268 (H) 02/19/2023 1430   HDL 37 (L) 02/19/2023 1430   CHOLHDL 3.8 02/19/2023 1430   LDLCALC 59 02/19/2023 1430    Clinical Atherosclerotic Cardiovascular Disease (ASCVD): {YES/NO:21197} The ASCVD Risk score (Arnett DK, et al., 2019) failed to calculate for the following reasons:   The 2019 ASCVD risk score is only valid for ages 64 to 57   Patient is participating in a Managed Medicaid Plan:  {MM YES/NO:27447::"Yes"}   A/P: Diabetes longstanding *** currently ***. Patient is *** able to verbalize  appropriate hypoglycemia management plan. Medication adherence appears ***. Control is suboptimal due to ***. -{Meds adjust:18428} basal insulin *** Lantus/Basaglar/Semglee (insulin glargine) *** Tresiba (insulin degludec) from *** units to *** units daily in the morning. Patient will  continue to titrate 1 unit every *** days if fasting blood sugar > 100mg /dl until fasting blood sugars reach goal or next visit.  -{Meds adjust:18428} rapid insulin *** Novolog (insulin aspart) *** Humalog (insulin lispro) from *** to ***.  -{Meds adjust:18428} GLP-1 *** Trulicity (dulaglutide) *** Ozempic (semaglutide) *** Mounjaro (tirzepatide) from *** mg to *** mg .  -{Meds adjust:18428} SGLT2-I *** Farxiga (dapagliflozin) *** Jardiance (empagliflozin) 10 mg. Counseled on sick day rules. -{Meds adjust:18428} metformin ***.  -Patient educated on purpose, proper use, and potential adverse effects of ***.  -Extensively discussed pathophysiology of diabetes, recommended lifestyle interventions, dietary effects on blood sugar control.  -Counseled on s/sx of and management of hypoglycemia.  -Next A1c anticipated ***.   ASCVD risk - primary ***secondary prevention in patient with diabetes. Last LDL is *** not at goal of <16 *** mg/dL. ASCVD risk factors include *** and 10-year ASCVD risk score of ***. {Desc; low/moderate/high:110033} intensity statin indicated.  -{Meds adjust:18428} ***statin *** mg.   Hypertension longstanding *** currently ***. Blood pressure goal of <130/80 *** mmHg. Medication adherence ***. Blood pressure control is suboptimal due to ***. -{Meds adjust:18428} *** mg.  Written patient instructions provided. Patient verbalized understanding of treatment plan.  Total time in face to face counseling *** minutes.    Follow-up:  Pharmacist *** PCP clinic visit in *** Patient seen with ***  Jarrett Ables, PharmD PGY-1 Pharmacy Resident

## 2023-08-12 ENCOUNTER — Other Ambulatory Visit: Payer: Self-pay

## 2023-08-13 ENCOUNTER — Other Ambulatory Visit: Payer: Self-pay

## 2023-08-14 ENCOUNTER — Other Ambulatory Visit: Payer: Self-pay

## 2023-09-01 ENCOUNTER — Ambulatory Visit: Payer: Self-pay | Admitting: Pharmacist

## 2023-09-01 NOTE — Progress Notes (Deleted)
S:     No chief complaint on file.  37 y.o. female who presents for diabetes evaluation, education, and management. PMH is significant for HTN, and CKD. Patient was referred and last seen by Primary Care Provider, Bertram Denver, on 07/14/2023. A1C at that visit was >15. BP at that visit was 147/101.   Patient arrives in *** good spirits and presents without *** any assistance. ***Patient is accompanied by ***.    Patient reports Diabetes was diagnosed in ***.   Family/Social History: ***  Current diabetes medications include: Insulin glargine (Basaglar Kwikpen) 20 units BID, Glipizide 10 mg daily Current hypertension medications include: Clonidine 1 patch once a week, labetolol, 300 mg (2 tabs) BID, Losartan 50 mg BID  Current hyperlipidemia medications include: atorvastatin 20 mg  Patient reports adherence to taking all medications as prescribed.  *** Patient denies adherence with medications, reports missing *** medications *** times per week, on average.  Do you feel that your medications are working for you? {YES NO:22349} Have you been experiencing any side effects to the medications prescribed? {YES NO:22349} Do you have any problems obtaining medications due to transportation or finances? {YES [id number] Insurance coverage: ***  Patient {Actions; denies-reports:120008} hypoglycemic events.  Reported home fasting blood sugars: ***  Reported 2 hour post-meal/random blood sugars: ***.  Patient {Actions; denies-reports:120008} nocturia (nighttime urination).  Patient {Actions; denies-reports:120008} neuropathy (nerve pain). Patient {Actions; denies-reports:120008} visual changes. Patient {Actions; denies-reports:120008} self foot exams.   Patient reported dietary habits: Eats *** meals/day Breakfast: *** Lunch: *** Dinner: *** Snacks: *** Drinks: ***  Within the past 12 months, did you worry whether your food would run out before you got money to buy more? {YES  NO:22349} Within the past 12 months, did the food you bought run out, and you didn't have money to get more? {YES NO:22349} PHQ-9 Score: ***  Patient-reported exercise habits: ***   O:   ROS  Physical Exam  7 day average blood glucose: ***  Libre3 *** CGM Download today *** on *** % Time CGM is active: ***% Average Glucose: *** mg/dL Glucose Management Indicator: ***  Glucose Variability: ***% (goal <36%) Time in Goal:  - Time in range 70-180: ***% - Time above range: ***% - Time below range: ***% Observed patterns:   Lab Results  Component Value Date   HGBA1C >15 07/14/2023   There were no vitals filed for this visit.  Lipid Panel     Component Value Date/Time   CHOL 139 02/19/2023 1430   TRIG 268 (H) 02/19/2023 1430   HDL 37 (L) 02/19/2023 1430   CHOLHDL 3.8 02/19/2023 1430   LDLCALC 59 02/19/2023 1430    Clinical Atherosclerotic Cardiovascular Disease (ASCVD): {YES/NO:21197} The ASCVD Risk score (Arnett DK, et al., 2019) failed to calculate for the following reasons:   The 2019 ASCVD risk score is only valid for ages 68 to 36   Patient is participating in a Managed Medicaid Plan:  {MM YES/NO:27447::"Yes"}   A/P: Diabetes longstanding *** currently ***. Patient is *** able to verbalize appropriate hypoglycemia management plan. Medication adherence appears ***. Control is suboptimal due to ***. -{Meds adjust:18428} basal insulin *** Lantus/Basaglar/Semglee (insulin glargine) *** Tresiba (insulin degludec) from *** units to *** units daily in the morning. Patient will continue to titrate 1 unit every *** days if fasting blood sugar > 100mg /dl until fasting blood sugars reach goal or next visit.  -{Meds adjust:18428} rapid insulin *** Novolog (insulin aspart) *** Humalog (insulin lispro) from ***  to ***.  -{Meds adjust:18428} GLP-1 *** Trulicity (dulaglutide) *** Ozempic (semaglutide) *** Mounjaro (tirzepatide) from *** mg to *** mg .  -{Meds adjust:18428} SGLT2-I  *** Farxiga (dapagliflozin) *** Jardiance (empagliflozin) 10 mg. Counseled on sick day rules. -{Meds adjust:18428} metformin ***.  -Patient educated on purpose, proper use, and potential adverse effects of ***.  -Extensively discussed pathophysiology of diabetes, recommended lifestyle interventions, dietary effects on blood sugar control.  -Counseled on s/sx of and management of hypoglycemia.  -Next A1c anticipated ***.   ASCVD risk - primary ***secondary prevention in patient with diabetes. Last LDL is *** not at goal of <09 *** mg/dL. ASCVD risk factors include *** and 10-year ASCVD risk score of ***. {Desc; low/moderate/high:110033} intensity statin indicated.  -{Meds adjust:18428} ***statin *** mg.   Hypertension longstanding *** currently ***. Blood pressure goal of <130/80 *** mmHg. Medication adherence ***. Blood pressure control is suboptimal due to ***. -{Meds adjust:18428} *** mg.  Written patient instructions provided. Patient verbalized understanding of treatment plan.  Total time in face to face counseling *** minutes.    Follow-up:  Pharmacist *** PCP clinic visit: not scheduled Patient seen with ***

## 2023-09-20 ENCOUNTER — Other Ambulatory Visit: Payer: Self-pay

## 2023-09-22 ENCOUNTER — Other Ambulatory Visit: Payer: Self-pay

## 2023-12-13 ENCOUNTER — Other Ambulatory Visit: Payer: Self-pay | Admitting: Nurse Practitioner

## 2023-12-13 DIAGNOSIS — E118 Type 2 diabetes mellitus with unspecified complications: Secondary | ICD-10-CM

## 2023-12-15 ENCOUNTER — Other Ambulatory Visit: Payer: Self-pay

## 2023-12-16 ENCOUNTER — Other Ambulatory Visit: Payer: Self-pay

## 2023-12-19 ENCOUNTER — Other Ambulatory Visit: Payer: Self-pay

## 2023-12-22 ENCOUNTER — Other Ambulatory Visit: Payer: Self-pay

## 2024-02-10 ENCOUNTER — Other Ambulatory Visit: Payer: Self-pay

## 2024-02-10 MED ORDER — GLIPIZIDE 10 MG PO TABS
10.0000 mg | ORAL_TABLET | Freq: Every day | ORAL | 3 refills | Status: DC
  Filled 2024-02-10: qty 30, 30d supply, fill #0
  Filled 2024-03-10: qty 90, 90d supply, fill #1
  Filled 2024-03-10: qty 30, 30d supply, fill #1

## 2024-02-10 MED ORDER — LABETALOL HCL 300 MG PO TABS
600.0000 mg | ORAL_TABLET | Freq: Two times a day (BID) | ORAL | 2 refills | Status: DC
  Filled 2024-02-10 – 2024-03-31 (×2): qty 120, 30d supply, fill #0

## 2024-02-10 MED ORDER — CLONIDINE 0.2 MG/24HR TD PTWK
0.2000 mg | MEDICATED_PATCH | TRANSDERMAL | 0 refills | Status: DC
  Filled 2024-02-10: qty 4, 28d supply, fill #0

## 2024-02-10 MED ORDER — LOSARTAN POTASSIUM 50 MG PO TABS
50.0000 mg | ORAL_TABLET | Freq: Two times a day (BID) | ORAL | 6 refills | Status: DC
  Filled 2024-02-10: qty 60, 30d supply, fill #0

## 2024-02-11 ENCOUNTER — Other Ambulatory Visit: Payer: Self-pay

## 2024-02-13 LAB — LAB REPORT - SCANNED
Albumin, Urine POC: 293.1
Microalb Creat Ratio: 769

## 2024-03-10 ENCOUNTER — Other Ambulatory Visit: Payer: Self-pay

## 2024-03-10 ENCOUNTER — Other Ambulatory Visit: Payer: Self-pay | Admitting: Nurse Practitioner

## 2024-03-10 DIAGNOSIS — E118 Type 2 diabetes mellitus with unspecified complications: Secondary | ICD-10-CM

## 2024-03-10 MED ORDER — GLIPIZIDE ER 10 MG PO TB24
10.0000 mg | ORAL_TABLET | Freq: Every day | ORAL | 0 refills | Status: DC
  Filled 2024-03-10: qty 30, 30d supply, fill #0

## 2024-03-15 ENCOUNTER — Other Ambulatory Visit: Payer: Self-pay

## 2024-03-19 ENCOUNTER — Other Ambulatory Visit: Payer: Self-pay

## 2024-03-31 ENCOUNTER — Other Ambulatory Visit: Payer: Self-pay

## 2024-03-31 ENCOUNTER — Other Ambulatory Visit: Payer: Self-pay | Admitting: Nephrology

## 2024-03-31 ENCOUNTER — Other Ambulatory Visit: Payer: Self-pay | Admitting: Family Medicine

## 2024-03-31 DIAGNOSIS — E118 Type 2 diabetes mellitus with unspecified complications: Secondary | ICD-10-CM

## 2024-04-07 ENCOUNTER — Telehealth: Payer: Self-pay

## 2024-04-07 ENCOUNTER — Other Ambulatory Visit: Payer: Self-pay | Admitting: Nurse Practitioner

## 2024-04-07 ENCOUNTER — Other Ambulatory Visit: Payer: Self-pay

## 2024-04-07 DIAGNOSIS — E118 Type 2 diabetes mellitus with unspecified complications: Secondary | ICD-10-CM

## 2024-04-07 MED ORDER — BASAGLAR KWIKPEN 100 UNIT/ML ~~LOC~~ SOPN
20.0000 [IU] | PEN_INJECTOR | Freq: Two times a day (BID) | SUBCUTANEOUS | 1 refills | Status: DC
  Filled 2024-04-07 – 2024-04-12 (×2): qty 15, 38d supply, fill #0

## 2024-04-07 NOTE — Telephone Encounter (Signed)
 Patient came in requesting refills on Glargine (BASAGLAR  KWIKPEN) 100 UNIT/ML patients last OV was 07/2023, advised patient that she may need a OV first. Patient only wanted MON, appointment scheduled for 05/10/24.

## 2024-04-07 NOTE — Telephone Encounter (Signed)
 Her last glucose was 500 based on nephrology scanned report. She is at serious risk of diabetic coma or ketoacidosis and an inpatient hospital stay if she does not get her diabetes under control. Insulin  sent to pharmacy.

## 2024-04-08 ENCOUNTER — Other Ambulatory Visit: Payer: Self-pay | Admitting: Nurse Practitioner

## 2024-04-08 ENCOUNTER — Other Ambulatory Visit: Payer: Self-pay

## 2024-04-08 DIAGNOSIS — E118 Type 2 diabetes mellitus with unspecified complications: Secondary | ICD-10-CM

## 2024-04-08 MED ORDER — GLIPIZIDE ER 10 MG PO TB24
10.0000 mg | ORAL_TABLET | Freq: Every day | ORAL | 0 refills | Status: DC
  Filled 2024-04-08: qty 30, 30d supply, fill #0
  Filled 2024-04-12: qty 90, 90d supply, fill #0

## 2024-04-08 NOTE — Telephone Encounter (Signed)
 Patient also requesting Glipizide  to be filled until appointment in August.

## 2024-04-08 NOTE — Telephone Encounter (Signed)
 Interpreter ID [place] [phone]

## 2024-04-08 NOTE — Telephone Encounter (Signed)
 SENT

## 2024-04-08 NOTE — Telephone Encounter (Signed)
Patient identified by name and date of birth.  Patient aware of providers response and voiced understanding.  

## 2024-04-12 ENCOUNTER — Other Ambulatory Visit: Payer: Self-pay

## 2024-04-12 ENCOUNTER — Other Ambulatory Visit (HOSPITAL_BASED_OUTPATIENT_CLINIC_OR_DEPARTMENT_OTHER): Payer: Self-pay

## 2024-04-13 ENCOUNTER — Other Ambulatory Visit: Payer: Self-pay

## 2024-04-20 ENCOUNTER — Other Ambulatory Visit: Payer: Self-pay

## 2024-04-23 ENCOUNTER — Ambulatory Visit
Admission: EM | Admit: 2024-04-23 | Discharge: 2024-04-23 | Disposition: A | Discharge status: Home / Self Care | Payer: Self-pay

## 2024-04-23 DIAGNOSIS — H04123 Dry eye syndrome of bilateral lacrimal glands: Secondary | ICD-10-CM

## 2024-04-23 DIAGNOSIS — I10 Essential (primary) hypertension: Secondary | ICD-10-CM

## 2024-04-23 DIAGNOSIS — E1139 Type 2 diabetes mellitus with other diabetic ophthalmic complication: Secondary | ICD-10-CM

## 2024-04-23 LAB — POCT FASTING CBG KUC MANUAL ENTRY: POCT Glucose (KUC): 330 mg/dL — AB (ref 70–99)

## 2024-04-23 MED ORDER — CARBOXYMETHYLCELLULOSE SODIUM 0.5 % OP SOLN: 1.0000 [drp] | Freq: Three times a day (TID) | OPHTHALMIC | 0 refills | Status: AC | PRN

## 2024-04-23 MED ORDER — POLYMYXIN B-TRIMETHOPRIM 10000-0.1 UNIT/ML-% OP SOLN: 1.0000 [drp] | Freq: Three times a day (TID) | OPHTHALMIC | 0 refills | Status: AC

## 2024-04-23 NOTE — Discharge Instructions (Addendum)
 Instill lubricant eyedrops up to 3 times daily for eye irritation.  And still antibiotics at least 2 hours between the time that you instilled the lubricant drops into your eyes.  Complete treatment with the Polytrim which is the antibiotics for complete 5 days 3 times daily.  Aplique gotas lubricantes hasta 3 veces al da para la irritacin ocular. Y tome antibiticos al menos 2 horas despus de [employer]. Complete el tratamiento con Polytrim, que contiene antibiticos, durante 5 das completos, 3 veces al da.

## 2024-04-23 NOTE — ED Triage Notes (Signed)
 Due to language barrier, an interpreter was present during the history-taking and subsequent discussion (and for part of the physical exam) with this patient. [place]. Number: [id number].  Here with Spouse. I am here because I have a lot of irritation in my eyes for the last 3 days, before I could see, now I am struggling to see with blurry vision, pain and discharge. No headache. No fever. No trauma or injury.

## 2024-04-23 NOTE — ED Provider Notes (Signed)
 [facility]    CSN: [id number] Arrival date & time: 04/23/24  0803      History   Chief Complaint Chief Complaint  Patient presents with   Eye Problem    HPI Renee Porter is a 38 y.o. female.   Patient presents for evaluation bilateral eye irritation, burning, blurring of vision and aggressive visual acuity changes.  Patient suffers from type 2 diabetes with stage IV kidney disease and hypertension.  Patient's last A1c was unmeasurable which it was greater than 15.  She is followed at [facility].  Patient has never had a diabetic eye exam.  Patient denies any known injury.  She endorses frequent tearing and symptoms appear severely worsened when she awakens in the morning.  Patient is uninsured which has been a barrier to receiving a diabetic eye exam.  Past Medical History:  Diagnosis Date   CKD stage 4 secondary to hypertension (HCC)    Diabetes mellitus without complication (HCC)    Hypertension    Prediabetes     Patient Active Problem List   Diagnosis Date Noted   CKD (chronic kidney disease) 06/29/2019   AKI (acute kidney injury) (HCC) 06/29/2019   Nephrotic range proteinuria 06/29/2019   Hypertension 07/20/2018   Kidney disease 07/20/2018    Past Surgical History:  Procedure Laterality Date   CESAREAN SECTION     FOOT SURGERY      OB History     Gravida  4   Para  3   Term  2   Preterm  1   AB  1   Living  3      SAB  1   IAB      Ectopic      Multiple  0   Live Births  3            Home Medications    Prior to Admission medications   Medication Sig Start Date End Date Taking? Authorizing Provider  carboxymethylcellulose ([employer] LUBRICANT EYE DROPS) 0.5 % SOLN Place 1 drop into both eyes 3 (three) times daily as needed (eye irritation and dryness). 04/23/24  Yes Arloa Suzen RAMAN, NP  Etonogestrel (NEXPLANON [place]) Inject into the skin.   Yes [provider]  glipiZIDE  (GLUCOTROL  XL) 10  MG 24 hr tablet Take 1 tablet (10 mg total) by mouth daily with breakfast. Please make a PCP appointment prior to receiving additional refills. 04/08/24  Yes Fleming, Zelda W, NP  labetalol  (NORMODYNE ) 300 MG tablet Take 2 tablets (600 mg total) by mouth 2 (two) times daily. 02/09/24  Yes Jake Maisie Fellows, PA-C  losartan  (COZAAR ) 50 MG tablet Take 1 tablet (50 mg total) by mouth daily. Tomar 1 comprimido al da para la presin arterial. 07/14/23  Yes Fleming, Zelda W, NP  acetaminophen  (TYLENOL ) 325 MG tablet Take 325 mg by mouth every 6 (six) hours as needed for moderate pain or headache.    [provider]  atorvastatin  (LIPITOR) 20 MG tablet Take 1 tablet (20 mg total) by mouth daily. 07/14/23   Fleming, Zelda W, NP  Blood Glucose Monitoring Suppl (TRUE METRIX METER) w/Device KIT Use as instructed. Check blood glucose level by fingerstick three times per day. 07/22/22   Fleming, Zelda W, NP  cloNIDine  (CATAPRES  - DOSED IN MG/24 HR) 0.2 mg/24hr patch Place 1 patch (0.2 mg total) onto the skin once a week. 02/09/24   Jake Maisie Fellows, PA-C  cloNIDine  (CATAPRES  - DOSED IN MG/24 HR) 0.3  mg/24hr patch Place 1 patch (0.3 mg total) onto the skin once a week. coloque un parche sobre la piel semanalmente. Para la presion arterial 07/14/23   Fleming, Zelda W, NP  fluconazole  (DIFLUCAN ) 150 MG tablet Take 1 tablet (150 mg total) by mouth every 3 (three) days. Tomar 1 tableta cada 3 das durante 1 semana para la irritacin vaginal. 07/14/23   Fleming, Zelda W, NP  glucose blood (TRUE METRIX BLOOD GLUCOSE TEST) test strip Use as instructed. Check blood glucose level by fingerstick three times per day. 07/22/22   Fleming, Zelda W, NP  Insulin  Glargine (BASAGLAR  KWIKPEN) 100 UNIT/ML Inject 20 Units into the skin 2 (two) times daily. inyectar 20 units en la piel dos veces al dia 04/07/24   Fleming, Zelda W, NP  losartan  (COZAAR ) 50 MG tablet Take 1 tablet (50 mg total) by mouth 2 (two) times daily. 07/29/23      losartan  (COZAAR ) 50 MG tablet Take 1 tablet (50 mg total) by mouth 2 (two) times daily. 07/30/23   Gearline Norris, MD  losartan  (COZAAR ) 50 MG tablet Take 1 tablet (50 mg total) by mouth 2 (two) times daily. 02/09/24   Jake Maisie Fellows, PA-C  TRUEplus Lancets 28G MISC Use as instructed. Check blood glucose level by fingerstick three times per day. 07/14/23   Theotis Haze ORN, NP    Family History Family History  Problem Relation Age of Onset   Diabetes Father     Social History Social History   Tobacco Use   Smoking status: Never   Smokeless tobacco: Never  Vaping Use   Vaping status: Never Used  Substance Use Topics   Alcohol use: Never   Drug use: Never     Allergies   Patient has no known allergies.   Review of Systems Review of Systems Pertinent negatives listed in HPI   Physical Exam Triage Vital Signs ED Triage Vitals  Encounter Vitals Group     BP --      Girls Systolic BP Percentile --      Girls Diastolic BP Percentile --      Boys Systolic BP Percentile --      Boys Diastolic BP Percentile --      Pulse --      Resp --      Temp --      Temp src --      SpO2 --      Weight 04/23/24 0812 149 lb (67.6 kg)     Height 04/23/24 0812 4' 9 (1.448 m)     Head Circumference --      Peak Flow --      Pain Score 04/23/24 0810 3     Pain Loc --      Pain Education --      Exclude from Growth Chart --    No data found.  Updated Vital Signs BP (!) 150/105 (BP Location: Left Arm)   Pulse 77   Temp 98.3 F (36.8 C) (Oral)   Resp 18   Ht 4' 9 (1.448 m)   Wt 149 lb (67.6 kg)   LMP 04/08/2024 (Exact Date)   SpO2 98%   Breastfeeding No   BMI 32.24 kg/m   Visual Acuity Right Eye Distance: (S) UTO (Unable to obtain), Patient refused can't see good enough Left Eye Distance: (S) UTO (Unable to obtain), Patient refused can't see good enough Bilateral Distance: (S) UTO (Unable to obtain), Patient refused can't see good enough  Right  Eye  Near:   Left Eye Near:    Bilateral Near:     Physical Exam Vitals reviewed.  Constitutional:      General: She is not in acute distress.    Appearance: She is not ill-appearing or toxic-appearing.  HENT:     Head: Normocephalic and atraumatic.  Eyes:     Pupils: Pupils are equal, round, and reactive to light.     Right eye: Pupil is not sluggish.     Left eye: Pupil is not sluggish.     Funduscopic exam:    Right eye: Exudate present.        Left eye: Exudate present.  Cardiovascular:     Rate and Rhythm: Normal rate.  Pulmonary:     Effort: Pulmonary effort is normal.  Musculoskeletal:        General: Normal range of motion.  Neurological:     General: No focal deficit present.     Mental Status: She is alert.      UC Treatments / Results  Labs (all labs ordered are listed, but only abnormal results are displayed) Labs Reviewed  POCT FASTING CBG [facility] MANUAL ENTRY - Abnormal; Notable for the following components:      Result Value   POCT Glucose ([facility]) 330 (*)    All other components within normal limits    EKG   Radiology No results found.  Procedures Procedures (including critical care time)  Medications Ordered in UC Medications - No data to display  Initial Impression / Assessment and Plan / UC Course  I have reviewed the triage vital signs and the nursing notes.  Pertinent labs & imaging results that were available during my care of the patient were reviewed by me and considered in my medical decision making (see chart for details).    Type 2 diabetes with ophthalmic complication with no history of a previous diabetic eye exam, patient has been emergently referred to retina [facility] through [facility] for emergent evaluation of symptoms.  Eyes appear to be very dry therefore I have prescribed 3 times daily I lubricant in addition to Polytrim.  ER precautions given if any visual loss occurs.  Made aware that [facility] will contact him by phone  however provide other contact information on AVS for them to follow up as well.  Patients asked and answered.  Final Clinical Impressions(s) / UC Diagnoses   Final diagnoses:  Type 2 diabetes mellitus with other ophthalmic complication, unspecified whether long term insulin  use (HCC)  Bilateral dry eyes  Essential hypertension     Discharge Instructions      Instill lubricant eyedrops up to 3 times daily for eye irritation.  And still antibiotics at least 2 hours between the time that you instilled the lubricant drops into your eyes.  Complete treatment with the Polytrim which is the antibiotics for complete 5 days 3 times daily.  Aplique gotas lubricantes hasta 3 veces al da para la irritacin ocular. Y tome antibiticos al menos 2 horas despus de [employer]. Complete el tratamiento con Polytrim, que contiene antibiticos, durante 5 das completos, 3 veces al da.     ED Prescriptions     Medication Sig Dispense Auth. Provider   carboxymethylcellulose ([employer]S LUBRICANT EYE DROPS) 0.5 % SOLN Place 1 drop into both eyes 3 (three) times daily as needed (eye irritation and dryness). 30 mL Arloa Suzen RAMAN, NP      PDMP not reviewed this encounter.  Arloa Suzen RAMAN, NP 04/23/24 [phone]

## 2024-04-23 NOTE — ED Notes (Signed)
 Last Glucose: At doctor's appt, not checked at home.

## 2024-04-28 ENCOUNTER — Ambulatory Visit: Payer: Self-pay

## 2024-04-28 ENCOUNTER — Telehealth: Payer: Self-pay | Admitting: Nurse Practitioner

## 2024-04-28 ENCOUNTER — Encounter: Payer: Self-pay | Admitting: Nurse Practitioner

## 2024-04-28 ENCOUNTER — Other Ambulatory Visit: Payer: Self-pay

## 2024-04-28 ENCOUNTER — Ambulatory Visit: Payer: Self-pay | Attending: Nurse Practitioner | Admitting: Nurse Practitioner

## 2024-04-28 DIAGNOSIS — E1122 Type 2 diabetes mellitus with diabetic chronic kidney disease: Secondary | ICD-10-CM

## 2024-04-28 DIAGNOSIS — Z794 Long term (current) use of insulin: Secondary | ICD-10-CM

## 2024-04-28 DIAGNOSIS — Z7984 Long term (current) use of oral hypoglycemic drugs: Secondary | ICD-10-CM

## 2024-04-28 DIAGNOSIS — N184 Chronic kidney disease, stage 4 (severe): Secondary | ICD-10-CM

## 2024-04-28 DIAGNOSIS — E785 Hyperlipidemia, unspecified: Secondary | ICD-10-CM

## 2024-04-28 DIAGNOSIS — E119 Type 2 diabetes mellitus without complications: Secondary | ICD-10-CM

## 2024-04-28 DIAGNOSIS — I10 Essential (primary) hypertension: Secondary | ICD-10-CM

## 2024-04-28 MED ORDER — BASAGLAR KWIKPEN 100 UNIT/ML ~~LOC~~ SOPN
20.0000 [IU] | PEN_INJECTOR | Freq: Two times a day (BID) | SUBCUTANEOUS | 3 refills | Status: DC
Start: 2024-04-28 — End: 2024-07-26
  Filled 2024-04-28 – 2024-05-15 (×2): qty 15, 38d supply, fill #0
  Filled 2024-06-12 – 2024-07-05 (×3): qty 15, 38d supply, fill #1

## 2024-04-28 MED ORDER — LOSARTAN POTASSIUM 50 MG PO TABS
50.0000 mg | ORAL_TABLET | Freq: Two times a day (BID) | ORAL | 1 refills | Status: DC
  Filled 2024-04-28: qty 180, 90d supply, fill #0

## 2024-04-28 MED ORDER — GLIPIZIDE ER 10 MG PO TB24
10.0000 mg | ORAL_TABLET | Freq: Every day | ORAL | 1 refills | Status: DC
  Filled 2024-04-28 – 2024-07-05 (×5): qty 90, 90d supply, fill #0

## 2024-04-28 MED ORDER — LABETALOL HCL 300 MG PO TABS
600.0000 mg | ORAL_TABLET | Freq: Two times a day (BID) | ORAL | 1 refills | Status: DC
  Filled 2024-04-28: qty 120, 30d supply, fill #0
  Filled 2024-05-15: qty 100, 25d supply, fill #0
  Filled 2024-05-17: qty 80, 20d supply, fill #0
  Filled 2024-05-17: qty 100, 25d supply, fill #0
  Filled 2024-05-18: qty 180, 45d supply, fill #0
  Filled 2024-07-03: qty 100, 25d supply, fill #1
  Filled 2024-07-05: qty 80, 20d supply, fill #1

## 2024-04-28 MED ORDER — ATORVASTATIN CALCIUM 20 MG PO TABS
20.0000 mg | ORAL_TABLET | Freq: Every day | ORAL | 1 refills | Status: DC
  Filled 2024-04-28: qty 90, 90d supply, fill #0
  Filled 2024-04-28: qty 75, 75d supply, fill #0
  Filled 2024-04-29: qty 90, 90d supply, fill #0
  Filled 2024-09-05 – 2024-09-06 (×2): qty 90, 90d supply, fill #1

## 2024-04-28 NOTE — Patient Instructions (Addendum)
 [place] [facility] [address]  [phone]

## 2024-04-28 NOTE — Telephone Encounter (Signed)
 Copied from CRM #[id number]. Topic: Referral - Status >> Apr 28, 2024 12:26 PM Zebedee SAUNDERS wrote:  Reason for CRM: Pt called stated [facility] with [facility] they need referral # [id number] faxed to: [phone] [facility] [address] [place], [place] [address] Ph: [phone].

## 2024-04-28 NOTE — Telephone Encounter (Signed)
 FYI Only or Action Required?: Action required by provider: request for appointment.  Patient was last seen in primary care on 07/14/2023 by Theotis Haze ORN, NP.  Called Nurse Triage reporting Blurred Vision.  Symptoms began a week ago.  Interventions attempted: Prescription medications: eye drops.  Symptoms are: unchanged.  Triage Disposition: See PCP Within 2 Weeks  Patient/caregiver understands and will follow disposition?: YesCopied from CRM [id number]. Topic: Clinical - Red Word Triage >> Apr 28, 2024 11:11 AM Turkey B wrote: [facility] that prompted transfer to Nurse Triage: pt has blurred vision, burning and redness Reason for Disposition  [1] Blurred vision or visual changes AND [2] gradual onset (e.g., weeks, months)  Answer Assessment - Initial Assessment Questions 1. DESCRIPTION: How has your vision changed? (e.g., complete vision loss, blurred vision, double vision, floaters, etc.)     blurred 2. LOCATION: One or both eyes? If one, ask: Which eye?     Both but right is worse 3. SEVERITY: Can you see anything? If Yes, ask: What can you see? (e.g., fine print)     If very close, can read 4. ONSET: When did this begin? Did it start suddenly or has this been gradual?     Last friday 5. PATTERN: Does this come and go, or has it been constant since it started?     Constant  6. PAIN: Is there any pain in your eye(s)?  (Scale 1-10; or mild, moderate, severe)     Mild-moderate 7. CONTACTS-GLASSES: Do you wear contacts or glasses?     denies 8. CAUSE: What do you think is causing this visual problem?     Poor blood sugar regulation 9. OTHER SYMPTOMS: Do you have any other symptoms? (e.g., confusion, headache, arm or leg weakness, speech problems) Burning and redness   Pt went to [facility] 7/18 and was given drops. Pt stated vision is still blurry and burning. It feels like oil is in eye. Pt noticed pimple in right lower eyelid. Yes is diabetic.  Blood sugar  was 300 last week after drinking coffee and no eating. Pt doesn't not monitor sugars regularly due to low supplies. UC gave pt to opthalmology for referral. Pt was given that number to call. RN made soonest appt with office and advised seek care at [facility] if symptoms worsen. Interpretor [phone] was used for call.  Protocols used: Vision Loss or Change-A-AH

## 2024-04-28 NOTE — Progress Notes (Signed)
 Assessment & Plan:  Harly was seen today for eye problem and diabetes.  Diagnoses and all orders for this visit:  Diabetes mellitus with insulin  therapy (HCC) -     CMP14+EGFR -     Hemoglobin A1c -     glipiZIDE  (GLUCOTROL  XL) 10 MG 24 hr tablet; Take 1 tablet (10 mg total) by mouth daily with breakfast. -     Insulin  Glargine (BASAGLAR  KW[place]PEN) 100 UNIT/ML; Inject 20 Units into the skin 2 (two) times daily. -     Ambulatory referral to Ophthalmology Continue blood sugar control as discussed in office today, low carbohydrate diet, and regular physical exercise as tolerated, 150 minutes per week (30 min each day, 5 days per week, or 50 min 3 days per week). Keep blood sugar logs with fasting goal of 90-130 mg/dl, post prandial (after you eat) less than 180.  For Hypoglycemia: BS <60 and Hyperglycemia BS >400; contact the clinic ASAP. Annual eye exams and foot exams are recommended.   Primary hypertension -     labetalol  (NORMODYNE ) 300 MG tablet; Take 2 tablets (600 mg total) by mouth 2 (two) times daily. FOR BLOOD PRESSURE -     losartan  (COZAAR ) 50 MG tablet; Take 1 tablet (50 mg total) by mouth 2 (two) times daily. FOR BLOOD PRESSURE Continue all antihypertensives as prescribed.  Reminded to bring in blood pressure log for follow  up appointment.  RECOMMENDATIONS: DASH/Mediterranean Diets are healthier choices for HTN.    Dyslipidemia, goal LDL below 70 -     atorvastatin  (LIPITOR) 20 MG tablet; Take 1 tablet (20 mg total) by mouth daily. INSTRUCTIONS: Work on a low fat, heart healthy diet and participate in regular aerobic exercise program by working out at least 150 minutes per week; 5 days a week-30 minutes per day. Avoid red meat/beef/steak,  fried foods. junk foods, sodas, sugary drinks, unhealthy snacking, alcohol and smoking.  Drink at least 80 oz of water per day and monitor your carbohydrate intake daily.    Stage 4 chronic kidney disease (HCC) -     CMP14+EGFR -     CBC  with Differential/Platelet    Patient has been counseled on age-appropriate routine health concerns for screening and prevention. These are reviewed and up-to-date. Referrals have been placed accordingly. Immunizations are up-to-date or declined.    Subjective:   Chief Complaint  Patient presents with   Eye Problem   Diabetes    Renee Porter 38 y.o. female presents to office today for follow up to DM  She has a past medical history of CKD stage 4 secondary to hypertension and DM, Diabetes mellitus with complication, Hypertension, and Prediabetes.   Blood pressure controlled today. She has a history of poorly controlled blood pressure with complications including ESRD.  She is currently prescribed losartan  50 mg twice daily, labetalol  600 mg twice daily and clonidine  patch  0.2 mg BP Readings from Last 3 Encounters:  04/28/24 122/82  04/23/24 (!) 150/105  07/14/23 (!) 147/101    Recently seen at the urgent care for new onset acute vision changes, blurred vision. She was referred to [facility] however they did not receive the referral. I was able to contact the office and we will fax referral in am. An appointment was made for her to be seen next week. This will be an out of pocket cost.     Review of Systems  Constitutional:  Negative for fever, malaise/fatigue and weight loss.  HENT: Negative.  Negative  for nosebleeds.   Eyes:  Positive for blurred vision. Negative for double vision and photophobia.  Respiratory: Negative.  Negative for cough and shortness of breath.   Cardiovascular: Negative.  Negative for chest pain, palpitations and leg swelling.  Gastrointestinal: Negative.  Negative for heartburn, nausea and vomiting.  Musculoskeletal: Negative.  Negative for myalgias.  Neurological: Negative.  Negative for dizziness, focal weakness, seizures and headaches.  Psychiatric/Behavioral: Negative.  Negative for suicidal ideas.     Past Medical History:  Diagnosis Date    CKD stage 4 secondary to hypertension (HCC)    Diabetes mellitus without complication (HCC)    Hypertension    Prediabetes     Past Surgical History:  Procedure Laterality Date   CESAREAN SECTION     FOOT SURGERY      Family History  Problem Relation Age of Onset   Diabetes Father     Social History Reviewed with no changes to be made today.   Outpatient Medications Prior to Visit  Medication Sig Dispense Refill   carboxymethylcellulose ([employer]S LUBRICANT EYE DROPS) 0.5 % SOLN Place 1 drop into both eyes 3 (three) times daily as needed (eye irritation and dryness). 30 mL 0   cloNIDine  (CATAPRES  - DOSED IN MG/24 HR) 0.2 mg/24hr patch Place 1 patch (0.2 mg total) onto the skin once a week. 4 patch 0   trimethoprim -polymyxin b  (POLYTRIM ) ophthalmic solution Place 1 drop into both eyes in the morning, at noon, and at bedtime for 5 days. 10 mL 0   glipiZIDE  (GLUCOTROL  XL) 10 MG 24 hr tablet Take 1 tablet (10 mg total) by mouth daily with breakfast. Please make a PCP appointment prior to receiving additional refills. 90 tablet 0   Insulin  Glargine (BASAGLAR  KW[place]PEN) 100 UNIT/ML Inject 20 Units into the skin 2 (two) times daily. inyectar 20 units en la piel dos veces al dia 15 mL 1   labetalol  (NORMODYNE ) 300 MG tablet Take 2 tablets (600 mg total) by mouth 2 (two) times daily. 120 tablet 2   losartan  (COZAAR ) 50 MG tablet Take 1 tablet (50 mg total) by mouth 2 (two) times daily. 60 tablet 6   Etonogestrel (NEXPLANON [place]) Inject into the skin.     glucose blood (TRUE METRIX BLOOD GLUCOSE TEST) test strip Use as instructed. Check blood glucose level by fingerstick three times per day. (Patient not taking: Reported on 04/28/2024) 200 each 12   TRUEplus Lancets 28G MISC Use as instructed. Check blood glucose level by fingerstick three times per day. (Patient not taking: Reported on 04/28/2024) 200 each 3   acetaminophen  (TYLENOL ) 325 MG tablet Take 325 mg by mouth every 6 (six) hours as needed for  moderate pain or headache. (Patient not taking: Reported on 04/28/2024)     atorvastatin  (LIPITOR) 20 MG tablet Take 1 tablet (20 mg total) by mouth daily. (Patient not taking: Reported on 04/28/2024) 90 tablet 1   Blood Glucose Monitoring Suppl (TRUE METRIX METER) w/Device KIT Use as instructed. Check blood glucose level by fingerstick three times per day. (Patient not taking: Reported on 04/28/2024) 1 kit 0   cloNIDine  (CATAPRES  - DOSED IN MG/24 HR) 0.3 mg/24hr patch Place 1 patch (0.3 mg total) onto the skin once a week. coloque un parche sobre la piel semanalmente. Para la presion arterial (Patient not taking: Reported on 04/28/2024) 12 patch 2   fluconazole  (DIFLUCAN ) 150 MG tablet Take 1 tablet (150 mg total) by mouth every 3 (three) days. Tomar 1 tableta cada 3 das  durante 1 semana para la irritacin vaginal. (Patient not taking: Reported on 04/28/2024) 3 tablet 0   losartan  (COZAAR ) 50 MG tablet Take 1 tablet (50 mg total) by mouth daily. Tomar 1 comprimido al da para la presin arterial. (Patient not taking: Reported on 04/28/2024) 90 tablet 1   losartan  (COZAAR ) 50 MG tablet Take 1 tablet (50 mg total) by mouth 2 (two) times daily. (Patient not taking: Reported on 04/28/2024) 60 tablet 6   losartan  (COZAAR ) 50 MG tablet Take 1 tablet (50 mg total) by mouth 2 (two) times daily. (Patient not taking: Reported on 04/28/2024) 60 tablet 6   No facility-administered medications prior to visit.    No Known Allergies     Objective:    BP 122/82 (BP Location: Left Arm, Patient Position: Sitting, Cuff Size: Normal)   Pulse 73   Resp 19   Ht 4' 9 (1.448 m)   Wt 149 lb 14.4 oz (68 kg)   LMP 04/08/2024 (Exact Date)   SpO2 100%   BMI 32.44 kg/m  Wt Readings from Last 3 Encounters:  04/28/24 149 lb 14.4 oz (68 kg)  04/23/24 149 lb (67.6 kg)  07/14/23 156 lb (70.8 kg)    Physical Exam Vitals and nursing note reviewed.  Constitutional:      Appearance: She is well-developed.  HENT:     Head:  Normocephalic and atraumatic.  Cardiovascular:     Rate and Rhythm: Normal rate and regular rhythm.     Heart sounds: Normal heart sounds. No murmur heard.    No friction rub. No gallop.  Pulmonary:     Effort: Pulmonary effort is normal. No tachypnea or respiratory distress.     Breath sounds: Normal breath sounds. No decreased breath sounds, wheezing, rhonchi or rales.  Chest:     Chest wall: No tenderness.  Abdominal:     General: Bowel sounds are normal.     Palpations: Abdomen is soft.  Musculoskeletal:        General: Normal range of motion.     Cervical back: Normal range of motion.  Skin:    General: Skin is warm and dry.  Neurological:     Mental Status: She is alert and oriented to person, place, and time.     Coordination: Coordination normal.  Psychiatric:        Behavior: Behavior normal. Behavior is cooperative.        Thought Content: Thought content normal.        Judgment: Judgment normal.          Patient has been counseled extensively about nutrition and exercise as well as the importance of adherence with medications and regular follow-up. The patient was given clear instructions to go to ER or return to medical center if symptoms don't improve, worsen or new problems develop. The patient verbalized understanding.   Follow-up: Return in about 3 months (around 08/03/2024).   Haze LELON Servant, FNP-BC [facility] [place], [place] [phone]   04/28/2024, 5:53 PM

## 2024-04-29 ENCOUNTER — Other Ambulatory Visit: Payer: Self-pay

## 2024-04-29 LAB — CBC WITH DIFFERENTIAL/PLATELET
Basophils Absolute: 0 x10E3/uL (ref 0.0–0.2)
Basos: 0 %
EOS (ABSOLUTE): 0.1 x10E3/uL (ref 0.0–0.4)
Eos: 1 %
Hematocrit: 38.5 % (ref 34.0–46.6)
Hemoglobin: 12.4 g/dL (ref 11.1–15.9)
Immature Grans (Abs): 0 x10E3/uL (ref 0.0–0.1)
Immature Granulocytes: 0 %
Lymphocytes Absolute: 1.4 x10E3/uL (ref 0.7–3.1)
Lymphs: 13 %
MCH: 27.9 pg (ref 26.6–33.0)
MCHC: 32.2 g/dL (ref 31.5–35.7)
MCV: 87 fL (ref 79–97)
Monocytes Absolute: 0.8 x10E3/uL (ref 0.1–0.9)
Monocytes: 7 %
Neutrophils Absolute: 8.8 x10E3/uL — ABNORMAL HIGH (ref 1.4–7.0)
Neutrophils: 79 %
Platelets: 398 x10E3/uL (ref 150–450)
RBC: 4.44 x10E6/uL (ref 3.77–5.28)
RDW: 12.9 % (ref 11.7–15.4)
WBC: 11.2 x10E3/uL — ABNORMAL HIGH (ref 3.4–10.8)

## 2024-04-29 LAB — CMP14+EGFR
ALT: 20 IU/L (ref 0–32)
AST: 19 IU/L (ref 0–40)
Albumin: 4 g/dL (ref 3.9–4.9)
Alkaline Phosphatase: 151 IU/L — ABNORMAL HIGH (ref 44–121)
BUN/Creatinine Ratio: 22 (ref 9–23)
BUN: 45 mg/dL — ABNORMAL HIGH (ref 6–20)
Bilirubin Total: 0.2 mg/dL (ref 0.0–1.2)
CO2: 17 mmol/L — ABNORMAL LOW (ref 20–29)
Calcium: 9.5 mg/dL (ref 8.7–10.2)
Chloride: 100 mmol/L (ref 96–106)
Creatinine, Ser: 2.06 mg/dL — ABNORMAL HIGH (ref 0.57–1.00)
Globulin, Total: 3 g/dL (ref 1.5–4.5)
Glucose: 170 mg/dL — ABNORMAL HIGH (ref 70–99)
Potassium: 5.8 mmol/L — ABNORMAL HIGH (ref 3.5–5.2)
Sodium: 132 mmol/L — ABNORMAL LOW (ref 134–144)
Total Protein: 7 g/dL (ref 6.0–8.5)
eGFR: 32 mL/min/1.73 — ABNORMAL LOW (ref >59)

## 2024-04-29 LAB — HEMOGLOBIN A1C
Est. average glucose Bld gHb Est-mCnc: >398 mg/dL
Hgb A1c MFr Bld: >15.5 % — ABNORMAL HIGH (ref 4.8–5.6)

## 2024-05-03 ENCOUNTER — Other Ambulatory Visit: Payer: Self-pay

## 2024-05-03 NOTE — Progress Notes (Signed)
 [facility] - Clinic Note  05/04/2024     CHIEF COMPLAINT Patient presents for No chief complaint on file.   HISTORY OF PRESENT ILLNESS: Renee Porter is a 38 y.o. female who presents to the clinic today for:     Referring physician: Theotis Haze ORN, NP [address] [place] [address] [place],  [place] [address]  HISTORICAL INFORMATION:   Selected notes from the MEDICAL RECORD NUMBER Referred by Dr. JAMA:  Ocular Hx- PMH-    CURRENT MEDICATIONS: Current Outpatient Medications (Ophthalmic Drugs)  Medication Sig   carboxymethylcellulose ([employer] LUBRICANT EYE DROPS) 0.5 % SOLN Place 1 drop into both eyes 3 (three) times daily as needed (eye irritation and dryness).   No current facility-administered medications for this visit. (Ophthalmic Drugs)   Current Outpatient Medications (Other)  Medication Sig   atorvastatin  (LIPITOR) 20 MG tablet Take 1 tablet (20 mg total) by mouth daily.   cloNIDine  (CATAPRES  - DOSED IN MG/24 HR) 0.2 mg/24hr patch Place 1 patch (0.2 mg total) onto the skin once a week.   Etonogestrel (NEXPLANON [employer]) Inject into the skin.   glipiZIDE  (GLUCOTROL  XL) 10 MG 24 hr tablet Take 1 tablet (10 mg total) by mouth daily with breakfast.   glucose blood (TRUE METRIX BLOOD GLUCOSE TEST) test strip Use as instructed. Check blood glucose level by fingerstick three times per day. (Patient not taking: Reported on 04/28/2024)   Insulin  Glargine (BASAGLAR  KWIKPEN) 100 UNIT/ML Inject 20 Units into the skin 2 (two) times daily.   labetalol  (NORMODYNE ) 300 MG tablet Take 2 tablets (600 mg total) by mouth 2 (two) times daily. FOR BLOOD PRESSURE   losartan  (COZAAR ) 50 MG tablet Take 1 tablet (50 mg total) by mouth 2 (two) times daily. FOR BLOOD PRESSURE   TRUEplus Lancets 28G MISC Use as instructed. Check blood glucose level by fingerstick three times per day. (Patient not taking: Reported on 04/28/2024)   No current facility-administered medications for this  visit. (Other)      REVIEW OF SYSTEMS:    ALLERGIES No Known Allergies  PAST MEDICAL HISTORY Past Medical History:  Diagnosis Date   CKD stage 4 secondary to hypertension (HCC)    Diabetes mellitus without complication (HCC)    Hypertension    Prediabetes    Past Surgical History:  Procedure Laterality Date   CESAREAN SECTION     FOOT SURGERY      FAMILY HISTORY Family History  Problem Relation Age of Onset   Diabetes Father     SOCIAL HISTORY Social History   Tobacco Use   Smoking status: Never   Smokeless tobacco: Never  Vaping Use   Vaping status: Never Used  Substance Use Topics   Alcohol use: Never   Drug use: Never         OPHTHALMIC EXAM:  Not recorded     IMAGING AND PROCEDURES  Imaging and Procedures for 05/04/2024           ASSESSMENT/PLAN:  No diagnosis found.  1.  2.  3.  Ophthalmic Meds Ordered this visit:  No orders of the defined types were placed in this encounter.      No follow-ups on file.  There are no Patient Instructions on file for this visit.   Explained the diagnoses, plan, and follow up with the patient and they expressed understanding.  Patient expressed understanding of the importance of proper follow up care.   This document serves as a record of services personally  performed by Redell JUDITHANN Hans, MD, PhD. It was created on their behalf by Auston Muzzy, COMT. The creation of this record is the provider's dictation and/or activities during the visit.  Electronically signed by: Auston Muzzy, COMT 05/03/24 10:39 AM    Redell JUDITHANN Hans, M.D., Ph.D. Diseases & Surgery of the Retina and Vitreous [facility] @TODAY @     Abbreviations: M myopia (nearsighted); A astigmatism; H hyperopia (farsighted); P presbyopia; Mrx spectacle prescription;  CTL contact lenses; OD right eye; OS left eye; OU both eyes  XT exotropia; ET esotropia; PEK punctate epithelial keratitis; PEE punctate  epithelial erosions; DES dry eye syndrome; MGD meibomian gland dysfunction; ATs artificial tears; PFAT's preservative free artificial tears; NSC nuclear sclerotic cataract; PSC posterior subcapsular cataract; ERM epi-retinal membrane; PVD posterior vitreous detachment; RD retinal detachment; DM diabetes mellitus; DR diabetic retinopathy; NPDR non-proliferative diabetic retinopathy; PDR proliferative diabetic retinopathy; CSME clinically significant macular edema; DME diabetic macular edema; dbh dot blot hemorrhages; CWS cotton wool spot; POAG primary open angle glaucoma; C/D cup-to-disc ratio; HVF humphrey visual field; GVF goldmann visual field; OCT optical coherence tomography; IOP intraocular pressure; BRVO Branch retinal vein occlusion; CRVO central retinal vein occlusion; CRAO central retinal artery occlusion; BRAO branch retinal artery occlusion; RT retinal tear; SB scleral buckle; PPV pars plana vitrectomy; VH Vitreous hemorrhage; PRP panretinal laser photocoagulation; IVK intravitreal kenalog; VMT vitreomacular traction; MH Macular hole;  NVD neovascularization of the disc; NVE neovascularization elsewhere; AREDS age related eye disease study; ARMD age related macular degeneration; POAG primary open angle glaucoma; EBMD epithelial/anterior basement membrane dystrophy; ACIOL anterior chamber intraocular lens; IOL intraocular lens; PCIOL posterior chamber intraocular lens; Phaco/IOL phacoemulsification with intraocular lens placement; PRK photorefractive keratectomy; LASIK laser assisted in situ keratomileusis; HTN hypertension; DM diabetes mellitus; COPD chronic obstructive pulmonary disease

## 2024-05-04 ENCOUNTER — Encounter (INDEPENDENT_AMBULATORY_CARE_PROVIDER_SITE_OTHER): Payer: Self-pay | Admitting: Ophthalmology

## 2024-05-04 ENCOUNTER — Ambulatory Visit (INDEPENDENT_AMBULATORY_CARE_PROVIDER_SITE_OTHER): Payer: Self-pay | Admitting: Ophthalmology

## 2024-05-04 DIAGNOSIS — H3581 Retinal edema: Secondary | ICD-10-CM

## 2024-05-04 DIAGNOSIS — E1136 Type 2 diabetes mellitus with diabetic cataract: Secondary | ICD-10-CM

## 2024-05-04 DIAGNOSIS — Z794 Long term (current) use of insulin: Secondary | ICD-10-CM

## 2024-05-04 DIAGNOSIS — E1165 Type 2 diabetes mellitus with hyperglycemia: Secondary | ICD-10-CM

## 2024-05-05 ENCOUNTER — Encounter (INDEPENDENT_AMBULATORY_CARE_PROVIDER_SITE_OTHER): Payer: Self-pay | Admitting: Ophthalmology

## 2024-05-10 ENCOUNTER — Ambulatory Visit: Payer: Self-pay | Admitting: Nurse Practitioner

## 2024-05-15 ENCOUNTER — Ambulatory Visit: Payer: Self-pay | Admitting: Nurse Practitioner

## 2024-05-17 ENCOUNTER — Other Ambulatory Visit: Payer: Self-pay

## 2024-05-18 ENCOUNTER — Other Ambulatory Visit: Payer: Self-pay

## 2024-05-19 ENCOUNTER — Other Ambulatory Visit: Payer: Self-pay

## 2024-05-20 ENCOUNTER — Ambulatory Visit: Payer: Self-pay | Admitting: Critical Care Medicine

## 2024-05-20 ENCOUNTER — Other Ambulatory Visit (HOSPITAL_BASED_OUTPATIENT_CLINIC_OR_DEPARTMENT_OTHER): Payer: Self-pay

## 2024-05-25 NOTE — Progress Notes (Signed)
 Noted

## 2024-05-31 ENCOUNTER — Telehealth: Payer: Self-pay | Admitting: Nurse Practitioner

## 2024-05-31 NOTE — Telephone Encounter (Signed)
Confirmed appt for 8/26

## 2024-06-01 ENCOUNTER — Ambulatory Visit: Payer: Self-pay | Admitting: Pharmacist

## 2024-06-13 ENCOUNTER — Other Ambulatory Visit: Payer: Self-pay

## 2024-06-14 ENCOUNTER — Other Ambulatory Visit: Payer: Self-pay

## 2024-07-05 ENCOUNTER — Other Ambulatory Visit: Payer: Self-pay

## 2024-07-26 ENCOUNTER — Encounter: Payer: Self-pay | Admitting: Pharmacist

## 2024-07-26 ENCOUNTER — Ambulatory Visit: Payer: Self-pay | Attending: Family Medicine | Admitting: Pharmacist

## 2024-07-26 ENCOUNTER — Other Ambulatory Visit: Payer: Self-pay

## 2024-07-26 VITALS — BP 116/84

## 2024-07-26 DIAGNOSIS — E119 Type 2 diabetes mellitus without complications: Secondary | ICD-10-CM

## 2024-07-26 DIAGNOSIS — I10 Essential (primary) hypertension: Secondary | ICD-10-CM

## 2024-07-26 DIAGNOSIS — M545 Low back pain, unspecified: Secondary | ICD-10-CM

## 2024-07-26 DIAGNOSIS — Z7984 Long term (current) use of oral hypoglycemic drugs: Secondary | ICD-10-CM

## 2024-07-26 DIAGNOSIS — E118 Type 2 diabetes mellitus with unspecified complications: Secondary | ICD-10-CM

## 2024-07-26 DIAGNOSIS — E11649 Type 2 diabetes mellitus with hypoglycemia without coma: Secondary | ICD-10-CM

## 2024-07-26 DIAGNOSIS — Z794 Long term (current) use of insulin: Secondary | ICD-10-CM

## 2024-07-26 LAB — POCT GLYCOSYLATED HEMOGLOBIN (HGB A1C): HbA1c, POC (controlled diabetic range): 5.8 % (ref 0.0–7.0)

## 2024-07-26 MED ORDER — BASAGLAR KWIKPEN 100 UNIT/ML ~~LOC~~ SOPN
20.0000 [IU] | PEN_INJECTOR | Freq: Every day | SUBCUTANEOUS | 1 refills | Status: AC
  Filled 2024-07-26: qty 18, 90d supply, fill #0
  Filled 2024-08-02: qty 6, 30d supply, fill #0
  Filled 2024-09-05: qty 6, 30d supply, fill #1
  Filled 2024-11-08 – 2024-11-09 (×2): qty 6, 30d supply, fill #2

## 2024-07-26 MED ORDER — TRUEPLUS LANCETS 28G MISC
3 refills | Status: AC
  Filled 2024-07-26: qty 100, 33d supply, fill #0

## 2024-07-26 MED ORDER — TRUE METRIX BLOOD GLUCOSE TEST VI STRP
ORAL_STRIP | 12 refills | Status: AC
  Filled 2024-07-26: qty 100, 33d supply, fill #0

## 2024-07-26 NOTE — Progress Notes (Signed)
 S:     No chief complaint on file.  38 y.o. female who presents for diabetes evaluation, education, and management.   Patient was referred and last seen by Primary Care Provider, Haze Servant, NP on 04/28/24. At last visit with Zelda,   PMH is significant for T2DM, HTN, CKD, HLD.   Patient arrives in good spirits and presents without any assistance. Patient is accompanied by her father. Interpreter Carlos ID [phone]. Her father reports her being very fatigued recently where she is completely out of it. She reports having hypoglycemic events with readings in the 60s. Corrects her low BG with juice and it levels out. Father also states that she will have events between 3-4am and he has candy available for her.   She states that she only used her Lantus  once yesterday evening. Due to language barrier, it seems like she has been once daily dosing for her insulin  for awhile. Her father attributes the morning dose to her feeling fatigued, confused, and not feeling normal. They both agreed that when she takes both Lantus  doses, she does not feel like herself.  Reports of having pain/burning sensation in her back/shoulder area since being out of work. She has been feeling this way for 4 months and asked if there was anything we recommended to help.  Family/Social History: not discussed at visit  Current diabetes medications include: Basaglar  20 units twice daily (has been doing 20 units at night sometimes), glipizide  XL 10 mg daily Current hypertension medications include: labetalol  600 mg twice a day, losartan  50 mg twice daily Current hyperlipidemia medications include: atorvastatin  20 mg daily  Insurance coverage: Self-pay  Patient reports hypoglycemic events.  Patient denies nocturia (nighttime urination).  Patient denies neuropathy (nerve pain). Patient denies visual changes. Patient reports self foot exams.   Patient reported dietary habits: snacks throughout the day, not real  meals Drinks: water  Patient-reported exercise habits: not lately due to feeling fatigued   O:   Lab Results  Component Value Date   HGBA1C 5.8 07/26/2024   Vitals:   07/26/24 1115  BP: 116/84    Lipid Panel     Component Value Date/Time   CHOL 139 02/19/2023 1430   TRIG 268 (H) 02/19/2023 1430   HDL 37 (L) 02/19/2023 1430   CHOLHDL 3.8 02/19/2023 1430   LDLCALC 59 02/19/2023 1430    Clinical Atherosclerotic Cardiovascular Disease (ASCVD): No  The ASCVD Risk score (Arnett DK, et al., 2019) failed to calculate for the following reasons:   The 2019 ASCVD risk score is only valid for ages 38 to 76    A/P: Diabetes longstanding currently is controlled. Patients A1c resulted today at 5.8%, which is down from >15.5% in July 2025! Commended patient for this. Adherence has played a big role in controlling her DM. Patient had been without medications for sometime, but has consistently filled meds since reestablishing care back in July 2025. Patient experiences hypoglycemic events that result in fatigue, stress, and not feeling normal. Patient has already self-titrated Lantus  to once a day in the evening. Unclear if this has been helping, but it seems like patient has not had an episode in a while. Patient is able to verbalize appropriate hypoglycemia management plan with juice and candy on hand. Medication adherence appears optimal with consistent refills and pick-ups of medications. Patient is on the right track with lowering her A1c and managing her DM. -Decreased dose of basal insulin  Lantus  (insulin  glargine) from 20 units BID to 20  units daily in the evening. Patient was likely overbasalized with relative hypoglycemia. Decreased down to what patient has self-titrated to see if this helps with hypoglycemic events. -Discontinued glipizide  10 mg. She has been controlled with glipizide  in her regimen. She does not need any additional medication at this time given recent A1c.  -Patient  educated on purpose, proper use, and potential adverse effects of insulin .  -Extensively discussed pathophysiology of diabetes, recommended lifestyle interventions, dietary effects on blood sugar control.  -Patient and father were able to provide understanding of medication plan through teach-back. -Counseled on s/sx of and management of hypoglycemia.  -Next A1c anticipated 09/2024.  -Patient is due for a new lipid panel at PCP appointment.  Hypertension longstanding currently is controlled. Blood pressure goal of <130/80 mmHg. Medication adherence seems to optimal. She has been without clonidine  patch for the last week. Without the patch, her BP has been controlled with just losartan  and labetalol . -Continued losartan  50 mg BID and labetalol  600 mg BID. -Recommended patient not request refills for clonidine  patch. Based on BP at PCP appointment next week, patch can be added back on if BP is elevated.  Back pain: recommended using Voltaren gel to help alleviate pain. Instructed patient to pick up gel downstairs at the pharmacy.   Written patient instructions provided. Patient verbalized understanding of treatment plan.  Total time in face to face counseling 60 minutes.    Follow-up:  Pharmacist 09/13/24 PCP clinic visit 08/02/24 with Haze [place], NP  Jenkins Graces, PharmD PGY1 Pharmacy Resident [phone]

## 2024-08-02 ENCOUNTER — Other Ambulatory Visit: Payer: Self-pay

## 2024-08-02 ENCOUNTER — Encounter: Payer: Self-pay | Admitting: Nurse Practitioner

## 2024-08-02 ENCOUNTER — Ambulatory Visit: Payer: Self-pay | Attending: Nurse Practitioner | Admitting: Nurse Practitioner

## 2024-08-02 VITALS — BP 115/77 | HR 82 | Resp 18 | Ht <= 58 in | Wt 144.6 lb

## 2024-08-02 DIAGNOSIS — I10 Essential (primary) hypertension: Secondary | ICD-10-CM

## 2024-08-02 DIAGNOSIS — M549 Dorsalgia, unspecified: Secondary | ICD-10-CM

## 2024-08-02 DIAGNOSIS — Z794 Long term (current) use of insulin: Secondary | ICD-10-CM

## 2024-08-02 DIAGNOSIS — E119 Type 2 diabetes mellitus without complications: Secondary | ICD-10-CM

## 2024-08-02 DIAGNOSIS — E7800 Pure hypercholesterolemia, unspecified: Secondary | ICD-10-CM

## 2024-08-02 DIAGNOSIS — D72829 Elevated white blood cell count, unspecified: Secondary | ICD-10-CM

## 2024-08-02 MED ORDER — LOSARTAN POTASSIUM 50 MG PO TABS
50.0000 mg | ORAL_TABLET | Freq: Two times a day (BID) | ORAL | 1 refills | Status: AC
  Filled 2024-08-02: qty 180, 90d supply, fill #0

## 2024-08-02 MED ORDER — DICLOFENAC SODIUM 1 % EX GEL
2.0000 g | Freq: Four times a day (QID) | CUTANEOUS | 3 refills | Status: AC
  Filled 2024-08-02: qty 200, 25d supply, fill #0

## 2024-08-02 MED ORDER — DICLOFENAC SODIUM 1 % EX GEL
2.0000 g | Freq: Four times a day (QID) | CUTANEOUS | 0 refills | Status: DC
  Filled 2024-08-02: qty 100, 30d supply, fill #0

## 2024-08-02 MED ORDER — LABETALOL HCL 300 MG PO TABS
600.0000 mg | ORAL_TABLET | Freq: Two times a day (BID) | ORAL | 1 refills | Status: AC
  Filled 2024-08-02 – 2024-09-05 (×2): qty 180, 45d supply, fill #0
  Filled 2024-09-06: qty 120, 30d supply, fill #0
  Filled 2024-11-08: qty 120, 30d supply, fill #1

## 2024-08-02 NOTE — Patient Instructions (Addendum)
 If blood sugars are higher than 140 in the morning for 3 days in a row then increase basaglar  by 2 units  You may increase by 2 units every week if needed but if you have increased up to 10 additional units please call the office

## 2024-08-02 NOTE — Progress Notes (Signed)
 Assessment & Plan:  [name] was seen today for hypertension.  Diagnoses and all orders for this visit:  Diabetes mellitus with insulin  therapy (HCC) -     CMP14+EGFR A1c significantly reduced from >15 to <6 since July. Insulin  continued, glipizide  discontinued due to hypoglycemia. Blood sugars fluctuating but generally acceptable. Recent eye surgery likely related to retinopathy. - Continue insulin  once daily at night. - Discontinue glipizide . - Monitor blood glucose levels. - Provide instructions for insulin  titration if glucose levels exceed target range. - Ensure medications have a 90-day supply with refills for six months. - Monitor for hypoglycemia and adjust insulin  as needed.  Primary hypertension -     labetalol  (NORMODYNE ) 300 MG tablet; Take 2 tablets (600 mg total) by mouth 2 (two) times daily. FOR BLOOD PRESSURE -     losartan  (COZAAR ) 50 MG tablet; Take 1 tablet (50 mg total) by mouth 2 (two) times daily. FOR BLOOD PRESSURE Well-controlled with losartan  and labetalol . No longer using blood pressure patch. - Continue losartan  and labetalol .  Hypercholesterolemia -     Lipid panel -     CMP14+EGFR  Leukocytosis, unspecified type -     CBC with Differential  Upper back pain -     diclofenac Sodium (VOLTAREN) 1 % GEL; Apply 2 gram topically 4 (four) times daily.    Patient has been counseled on age-appropriate routine health concerns for screening and prevention. These are reviewed and up-to-date. Referrals have been placed accordingly. Immunizations are up-to-date or declined.    Subjective:   Chief Complaint  Patient presents with   Hypertension    Renee Porter 38 y.o. female presents to office today for f/u to DM and HTN.  She is accompanied by her significant other  She is requesting a referral for nexplanon removal which has expired however she had it inserted at the local health department and does not require a referral   She has a history of type  2 diabetes and is currently managing her condition with basaglar . Her regimen was adjusted from two doses per day to one dose at night due to episodes of hypoglycemia, characterized by 'shaking' and feeling 'geeky' at night. Her A1c has improved from over 15% to under 6% as of today. Current blood sugar readings vary, with recent measurements being 145 mg/dL in the morning and under 90 mg/dL on another occasion. She is no longer taking glipizide . Lab Results  Component Value Date   HGBA1C 5.8 07/26/2024   LDL at goal with atorvastatin  20 mg Lab Results  Component Value Date   LDLCALC 59 02/19/2023     She is experiencing shoulder and upper back pain, described as a heavy and painful sensation. She is requesting voltaren gel.   She has a birth control implant that has been in place for five years. She attempted to have it removed in August but was unable to do so due to a miscommunication about needing to attend the appointment alone. She requires assistance due to her visual impairment and plans to reschedule the removal.   Review of Systems  Constitutional:  Negative for fever, malaise/fatigue and weight loss.  HENT: Negative.  Negative for nosebleeds.   Eyes: Negative.  Negative for blurred vision, double vision and photophobia.  Respiratory: Negative.  Negative for cough and shortness of breath.   Cardiovascular: Negative.  Negative for chest pain, palpitations and leg swelling.  Gastrointestinal: Negative.  Negative for heartburn, nausea and vomiting.  Musculoskeletal:  Positive for back pain  and myalgias.  Neurological: Negative.  Negative for dizziness, focal weakness, seizures and headaches.  Psychiatric/Behavioral: Negative.  Negative for suicidal ideas.     Past Medical History:  Diagnosis Date   CKD stage 4 secondary to hypertension (HCC)    Diabetes mellitus without complication (HCC)    Hypertension    Prediabetes     Past Surgical History:  Procedure Laterality Date    CESAREAN SECTION     FOOT SURGERY      Family History  Problem Relation Age of Onset   Diabetes Father     Social History Reviewed with no changes to be made today.   Outpatient Medications Prior to Visit  Medication Sig Dispense Refill   atorvastatin  (LIPITOR) 20 MG tablet Take 1 tablet (20 mg total) by mouth daily. 90 tablet 1   carboxymethylcellulose ([employer] LUBRICANT EYE DROPS) 0.5 % SOLN Place 1 drop into both eyes 3 (three) times daily as needed (eye irritation and dryness). 30 mL 0   Etonogestrel (NEXPLANON [place]) Inject into the skin.     glucose blood (TRUE METRIX BLOOD GLUCOSE TEST) test strip Use as instructed. Check blood glucose level by fingerstick three times per day. 200 each 12   Insulin  Glargine (BASAGLAR  KWIKPEN) 100 UNIT/ML Inject 20 Units into the skin daily. 18 mL 1   TRUEplus Lancets 28G MISC Use as instructed. Check blood glucose level by fingerstick three times per day. 200 each 3   cloNIDine  (CATAPRES  - DOSED IN MG/24 HR) 0.2 mg/24hr patch Place 1 patch (0.2 mg total) onto the skin once a week. 4 patch 0   labetalol  (NORMODYNE ) 300 MG tablet Take 2 tablets (600 mg total) by mouth 2 (two) times daily. FOR BLOOD PRESSURE 180 tablet 1   losartan  (COZAAR ) 50 MG tablet Take 1 tablet (50 mg total) by mouth 2 (two) times daily. FOR BLOOD PRESSURE 180 tablet 1   No facility-administered medications prior to visit.    No Known Allergies     Objective:    BP 115/77 (BP Location: Left Arm, Patient Position: Sitting, Cuff Size: Normal)   Pulse 82   Resp 18   Ht 4' 9 (1.448 m)   Wt 144 lb 9.6 oz (65.6 kg)   LMP 06/20/2024 (Exact Date)   SpO2 98%   BMI 31.29 kg/m  Wt Readings from Last 3 Encounters:  08/02/24 144 lb 9.6 oz (65.6 kg)  04/28/24 149 lb 14.4 oz (68 kg)  04/23/24 149 lb (67.6 kg)    Physical Exam Vitals and nursing note reviewed.  Constitutional:      Appearance: She is well-developed.  HENT:     Head: Normocephalic and atraumatic.   Cardiovascular:     Rate and Rhythm: Normal rate and regular rhythm.     Heart sounds: Normal heart sounds. No murmur heard.    No friction rub. No gallop.  Pulmonary:     Effort: Pulmonary effort is normal. No tachypnea or respiratory distress.     Breath sounds: Normal breath sounds. No decreased breath sounds, wheezing, rhonchi or rales.  Chest:     Chest wall: No tenderness.  Musculoskeletal:        General: Normal range of motion.     Cervical back: Normal range of motion.  Skin:    General: Skin is warm and dry.  Neurological:     Mental Status: She is alert and oriented to person, place, and time.     Coordination: Coordination normal.  Psychiatric:  Behavior: Behavior normal. Behavior is cooperative.        Thought Content: Thought content normal.        Judgment: Judgment normal.          Patient has been counseled extensively about nutrition and exercise as well as the importance of adherence with medications and regular follow-up. The patient was given clear instructions to go to ER or return to medical center if symptoms don't improve, worsen or new problems develop. The patient verbalized understanding.   Follow-up: Return in about 3 months (around 11/05/2024).   Haze LELON Servant, FNP-BC [facility] [place], [place] [phone]   08/02/2024, 11:18 PM

## 2024-08-03 ENCOUNTER — Ambulatory Visit: Payer: Self-pay | Admitting: Nurse Practitioner

## 2024-08-03 LAB — CBC WITH DIFFERENTIAL/PLATELET
Basophils Absolute: 0.1 x10E3/uL (ref 0.0–0.2)
Basos: 1 %
EOS (ABSOLUTE): 0.1 x10E3/uL (ref 0.0–0.4)
Eos: 1 %
Hematocrit: 35.2 % (ref 34.0–46.6)
Hemoglobin: 11.1 g/dL (ref 11.1–15.9)
Immature Grans (Abs): 0 x10E3/uL (ref 0.0–0.1)
Immature Granulocytes: 0 %
Lymphocytes Absolute: 1.4 x10E3/uL (ref 0.7–3.1)
Lymphs: 14 %
MCH: 27.8 pg (ref 26.6–33.0)
MCHC: 31.5 g/dL (ref 31.5–35.7)
MCV: 88 fL (ref 79–97)
Monocytes Absolute: 0.7 x10E3/uL (ref 0.1–0.9)
Monocytes: 7 %
Neutrophils Absolute: 7.6 x10E3/uL — ABNORMAL HIGH (ref 1.4–7.0)
Neutrophils: 77 %
Platelets: 364 x10E3/uL (ref 150–450)
RBC: 3.99 x10E6/uL (ref 3.77–5.28)
RDW: 13.9 % (ref 11.7–15.4)
WBC: 9.8 x10E3/uL (ref 3.4–10.8)

## 2024-08-03 LAB — CMP14+EGFR
ALT: 20 IU/L (ref 0–32)
AST: 15 IU/L (ref 0–40)
Albumin: 4.1 g/dL (ref 3.9–4.9)
Alkaline Phosphatase: 122 IU/L — ABNORMAL HIGH (ref 41–116)
BUN/Creatinine Ratio: 16 (ref 9–23)
BUN: 36 mg/dL — ABNORMAL HIGH (ref 6–20)
Bilirubin Total: 0.5 mg/dL (ref 0.0–1.2)
CO2: 17 mmol/L — ABNORMAL LOW (ref 20–29)
Calcium: 9.9 mg/dL (ref 8.7–10.2)
Chloride: 109 mmol/L — ABNORMAL HIGH (ref 96–106)
Creatinine, Ser: 2.3 mg/dL — ABNORMAL HIGH (ref 0.57–1.00)
Globulin, Total: 2.9 g/dL (ref 1.5–4.5)
Glucose: 137 mg/dL — ABNORMAL HIGH (ref 70–99)
Potassium: 5.5 mmol/L — ABNORMAL HIGH (ref 3.5–5.2)
Sodium: 140 mmol/L (ref 134–144)
Total Protein: 7 g/dL (ref 6.0–8.5)
eGFR: 28 mL/min/1.73 — ABNORMAL LOW (ref >59)

## 2024-08-03 LAB — LIPID PANEL
Chol/HDL Ratio: 3.2 ratio (ref 0.0–4.4)
Cholesterol, Total: 110 mg/dL (ref 100–199)
HDL: 34 mg/dL — ABNORMAL LOW (ref >39)
LDL Chol Calc (NIH): 61 mg/dL (ref 0–99)
Triglycerides: 72 mg/dL (ref 0–149)
VLDL Cholesterol Cal: 15 mg/dL (ref 5–40)

## 2024-09-06 ENCOUNTER — Other Ambulatory Visit: Payer: Self-pay

## 2024-09-13 ENCOUNTER — Ambulatory Visit: Payer: Self-pay | Admitting: Pharmacist

## 2024-11-08 ENCOUNTER — Ambulatory Visit: Payer: Self-pay | Admitting: Nurse Practitioner

## 2024-11-08 ENCOUNTER — Telehealth: Payer: Self-pay | Admitting: Nurse Practitioner

## 2024-11-08 ENCOUNTER — Other Ambulatory Visit: Payer: Self-pay | Admitting: Nurse Practitioner

## 2024-11-08 DIAGNOSIS — E785 Hyperlipidemia, unspecified: Secondary | ICD-10-CM

## 2024-11-08 NOTE — Telephone Encounter (Signed)
 Contacted pt resch appt!

## 2024-11-09 ENCOUNTER — Other Ambulatory Visit: Payer: Self-pay

## 2024-11-09 MED ORDER — ATORVASTATIN CALCIUM 20 MG PO TABS
20.0000 mg | ORAL_TABLET | Freq: Every day | ORAL | 0 refills | Status: AC
  Filled 2024-11-09: qty 90, 90d supply, fill #0

## 2024-11-29 ENCOUNTER — Ambulatory Visit: Payer: Self-pay | Admitting: Nurse Practitioner
